# Patient Record
Sex: Male | Born: 1935 | Race: Black or African American | Hispanic: No | Marital: Married | Smoking: Former smoker
Health system: Southern US, Community
[De-identification: ages and names within clinical notes are randomized; demographics above are authoritative.]

## PROBLEM LIST (undated history)

## (undated) DIAGNOSIS — M48061 Spinal stenosis, lumbar region without neurogenic claudication: Secondary | ICD-10-CM

## (undated) DIAGNOSIS — F32A Depression, unspecified: Secondary | ICD-10-CM

## (undated) DIAGNOSIS — M1990 Unspecified osteoarthritis, unspecified site: Secondary | ICD-10-CM

## (undated) DIAGNOSIS — I4891 Unspecified atrial fibrillation: Secondary | ICD-10-CM

## (undated) DIAGNOSIS — N289 Disorder of kidney and ureter, unspecified: Secondary | ICD-10-CM

## (undated) DIAGNOSIS — E785 Hyperlipidemia, unspecified: Secondary | ICD-10-CM

## (undated) DIAGNOSIS — F419 Anxiety disorder, unspecified: Secondary | ICD-10-CM

## (undated) DIAGNOSIS — E269 Hyperaldosteronism, unspecified: Secondary | ICD-10-CM

## (undated) DIAGNOSIS — M4800 Spinal stenosis, site unspecified: Secondary | ICD-10-CM

## (undated) DIAGNOSIS — Z8711 Personal history of peptic ulcer disease: Secondary | ICD-10-CM

## (undated) DIAGNOSIS — C61 Malignant neoplasm of prostate: Secondary | ICD-10-CM

## (undated) DIAGNOSIS — R531 Weakness: Secondary | ICD-10-CM

## (undated) DIAGNOSIS — E538 Deficiency of other specified B group vitamins: Secondary | ICD-10-CM

## (undated) DIAGNOSIS — E876 Hypokalemia: Secondary | ICD-10-CM

## (undated) DIAGNOSIS — I82409 Acute embolism and thrombosis of unspecified deep veins of unspecified lower extremity: Secondary | ICD-10-CM

## (undated) DIAGNOSIS — I10 Essential (primary) hypertension: Secondary | ICD-10-CM

## (undated) DIAGNOSIS — G510 Bell's palsy: Secondary | ICD-10-CM

## (undated) DIAGNOSIS — N390 Urinary tract infection, site not specified: Secondary | ICD-10-CM

## (undated) DIAGNOSIS — I509 Heart failure, unspecified: Secondary | ICD-10-CM

## (undated) DIAGNOSIS — F329 Major depressive disorder, single episode, unspecified: Secondary | ICD-10-CM

## (undated) DIAGNOSIS — I77810 Thoracic aortic ectasia: Secondary | ICD-10-CM

## (undated) DIAGNOSIS — F0390 Unspecified dementia without behavioral disturbance: Secondary | ICD-10-CM

## (undated) DIAGNOSIS — I350 Nonrheumatic aortic (valve) stenosis: Secondary | ICD-10-CM

## (undated) DIAGNOSIS — J45909 Unspecified asthma, uncomplicated: Secondary | ICD-10-CM

## (undated) HISTORY — DX: Spinal stenosis, site unspecified: M48.00

## (undated) HISTORY — DX: Deficiency of other specified B group vitamins: E53.8

## (undated) HISTORY — DX: Thoracic aortic ectasia: I77.810

## (undated) HISTORY — DX: Heart failure, unspecified: I50.9

## (undated) HISTORY — DX: Nonrheumatic aortic (valve) stenosis: I35.0

## (undated) HISTORY — DX: Hyperaldosteronism, unspecified: E26.9

---

## 2000-04-15 ENCOUNTER — Ambulatory Visit (HOSPITAL_COMMUNITY): Admission: RE | Admit: 2000-04-15 | Discharge: 2000-04-15 | Payer: Self-pay | Admitting: *Deleted

## 2000-08-19 ENCOUNTER — Encounter: Admission: RE | Admit: 2000-08-19 | Discharge: 2000-09-11 | Payer: Self-pay | Admitting: Anesthesiology

## 2001-11-22 ENCOUNTER — Emergency Department (HOSPITAL_COMMUNITY): Admission: EM | Admit: 2001-11-22 | Discharge: 2001-11-22 | Payer: Self-pay | Admitting: Emergency Medicine

## 2001-11-26 ENCOUNTER — Emergency Department (HOSPITAL_COMMUNITY): Admission: EM | Admit: 2001-11-26 | Discharge: 2001-11-26 | Payer: Self-pay | Admitting: Emergency Medicine

## 2002-10-10 ENCOUNTER — Encounter: Admission: RE | Admit: 2002-10-10 | Discharge: 2002-10-10 | Payer: Self-pay | Admitting: Nephrology

## 2002-10-10 ENCOUNTER — Encounter: Payer: Self-pay | Admitting: Nephrology

## 2003-04-27 ENCOUNTER — Inpatient Hospital Stay (HOSPITAL_COMMUNITY): Admission: EM | Admit: 2003-04-27 | Discharge: 2003-04-29 | Payer: Self-pay | Admitting: Emergency Medicine

## 2004-04-07 ENCOUNTER — Ambulatory Visit (HOSPITAL_COMMUNITY): Admission: RE | Admit: 2004-04-07 | Discharge: 2004-04-07 | Payer: Self-pay | Admitting: Urology

## 2004-04-16 ENCOUNTER — Ambulatory Visit (HOSPITAL_COMMUNITY): Admission: RE | Admit: 2004-04-16 | Discharge: 2004-04-16 | Payer: Self-pay | Admitting: Urology

## 2004-11-08 ENCOUNTER — Ambulatory Visit (HOSPITAL_COMMUNITY): Admission: RE | Admit: 2004-11-08 | Discharge: 2004-11-08 | Payer: Self-pay | Admitting: Nephrology

## 2004-12-02 ENCOUNTER — Encounter: Admission: RE | Admit: 2004-12-02 | Discharge: 2004-12-02 | Payer: Self-pay | Admitting: Urology

## 2004-12-17 ENCOUNTER — Ambulatory Visit (HOSPITAL_COMMUNITY): Admission: RE | Admit: 2004-12-17 | Discharge: 2004-12-18 | Payer: Self-pay | Admitting: Interventional Radiology

## 2005-01-01 ENCOUNTER — Encounter: Admission: RE | Admit: 2005-01-01 | Discharge: 2005-01-01 | Payer: Self-pay | Admitting: Interventional Radiology

## 2005-01-22 ENCOUNTER — Encounter: Admission: RE | Admit: 2005-01-22 | Discharge: 2005-01-22 | Payer: Self-pay | Admitting: Interventional Radiology

## 2005-05-05 ENCOUNTER — Encounter: Admission: RE | Admit: 2005-05-05 | Discharge: 2005-05-05 | Payer: Self-pay | Admitting: Interventional Radiology

## 2005-07-03 ENCOUNTER — Encounter (INDEPENDENT_AMBULATORY_CARE_PROVIDER_SITE_OTHER): Payer: Self-pay | Admitting: *Deleted

## 2005-07-04 ENCOUNTER — Inpatient Hospital Stay (HOSPITAL_COMMUNITY): Admission: RE | Admit: 2005-07-04 | Discharge: 2005-07-07 | Payer: Self-pay | Admitting: General Surgery

## 2005-07-06 ENCOUNTER — Encounter (INDEPENDENT_AMBULATORY_CARE_PROVIDER_SITE_OTHER): Payer: Self-pay | Admitting: Interventional Cardiology

## 2005-08-25 ENCOUNTER — Encounter: Admission: RE | Admit: 2005-08-25 | Discharge: 2005-08-25 | Payer: Self-pay | Admitting: General Surgery

## 2005-12-16 ENCOUNTER — Encounter: Admission: RE | Admit: 2005-12-16 | Discharge: 2005-12-16 | Payer: Self-pay | Admitting: Interventional Radiology

## 2006-01-12 HISTORY — PX: BACK SURGERY: SHX140

## 2006-11-21 IMAGING — CT CT ABDOMEN WO/W CM
1 of 8 series · 11 of 32 positions shown, 17 images · IV contrast ([ID] OMNI 300)
Comparison: 05/05/05.

CLINICAL DATA: Patient is eight months status post percutaneous radiofrequency ablation to treat an enhancing tumor of the left kidney.  The original procedure was performed on 12/17/04.  This represents an eight month follow up CT scan. 
CT ABDOMEN WITHOUT AND WITH CONTRAST:
TECHNIQUE: Multidetector CT imaging of the abdomen was performed both before and during bolus administration of intravenous contrast.
Contrast:  cc Omnipaque 300.  The patient was fully premedicated prior to the procedure for known IV contrast allergy.  Labs obtained prior to the study showed BUN of 18 and creatinine of 1.2.

[Series 5: recon 2: arterial,venous & (person_name) · axial · arterial · 0.70mm/px · z∈[-174,+43]mm · 11 of 417 slices shown, 17 images]
[im 35/417  soft-tissue]
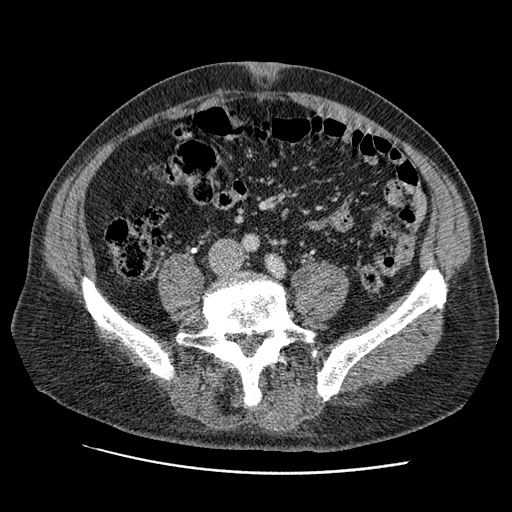
[im 35/417  bone]
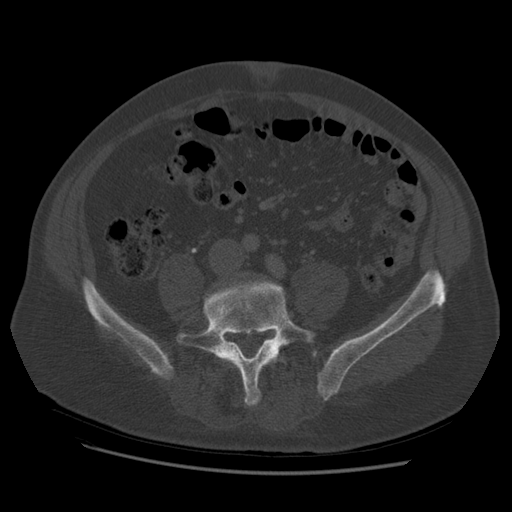
[im 70/417  soft-tissue]
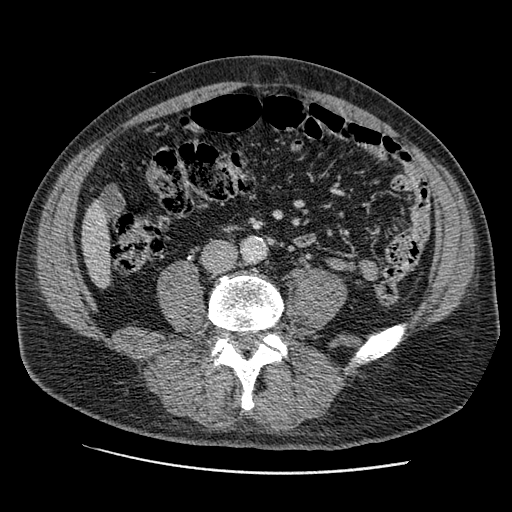
[im 105/417  soft-tissue]
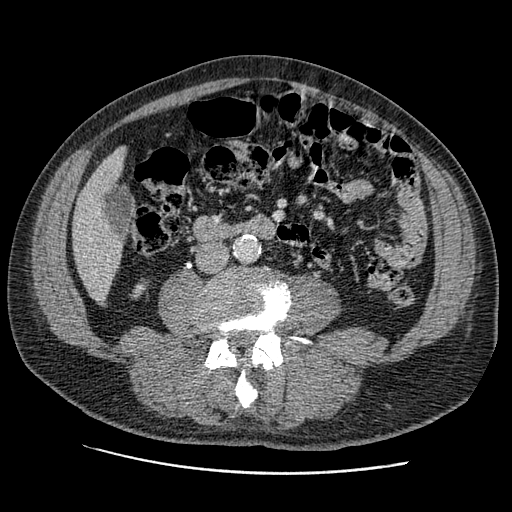
[im 139/417  soft-tissue]
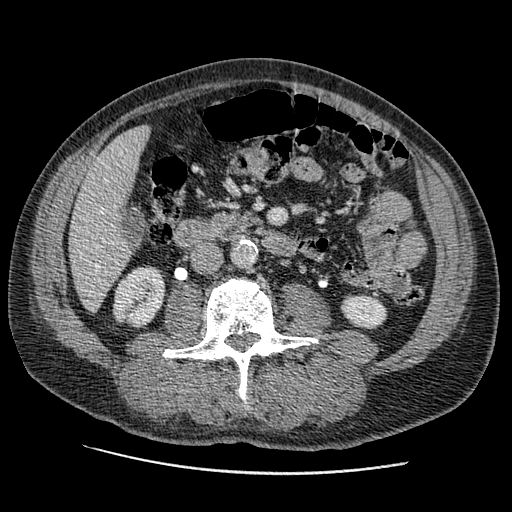
[im 174/417  soft-tissue]
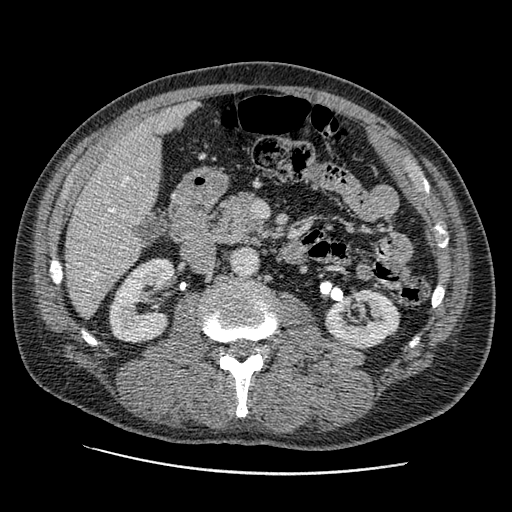
[im 209/417  soft-tissue]
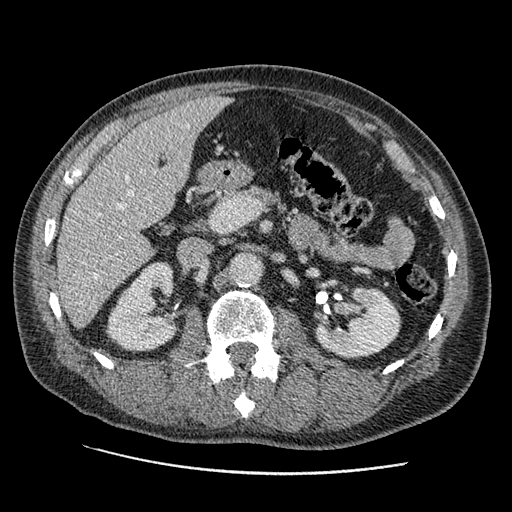
[im 243/417  soft-tissue]
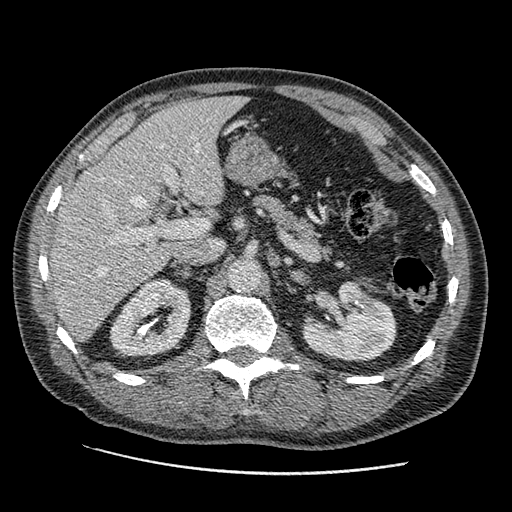
[im 278/417  soft-tissue]
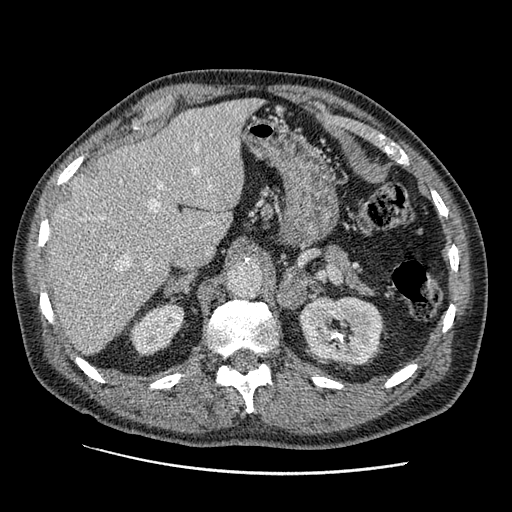
[im 278/417  lung]
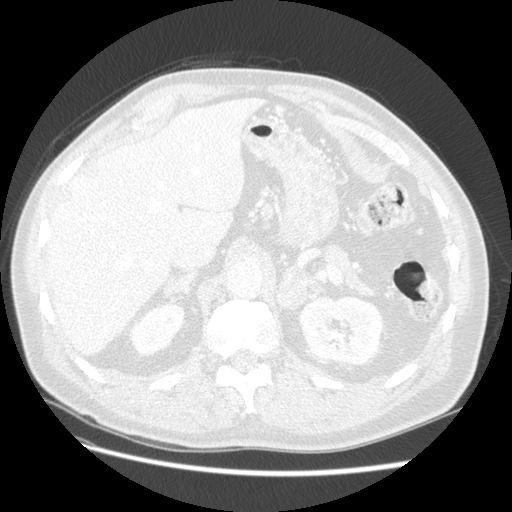
[im 313/417  soft-tissue]
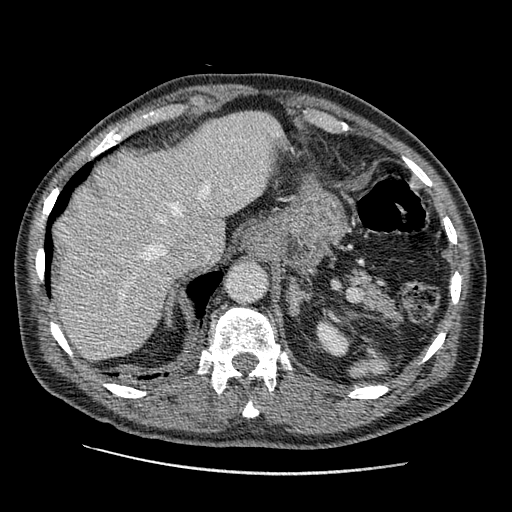
[im 313/417  lung]
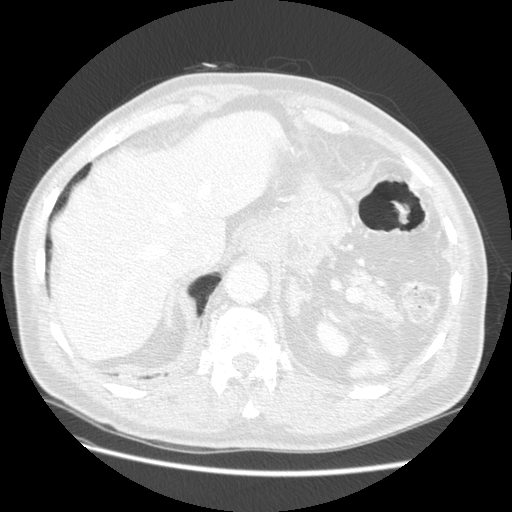
[im 313/417  bone]
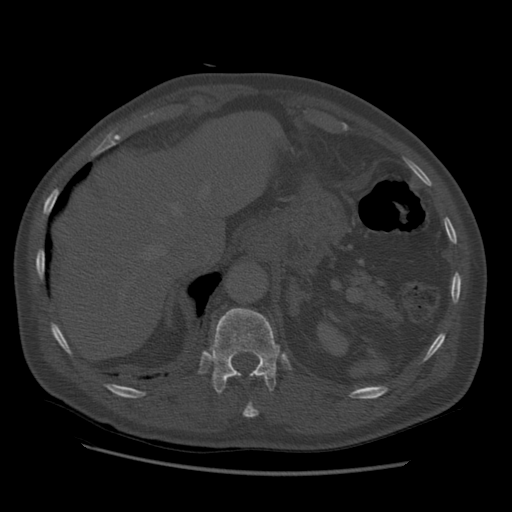
[im 347/417  soft-tissue]
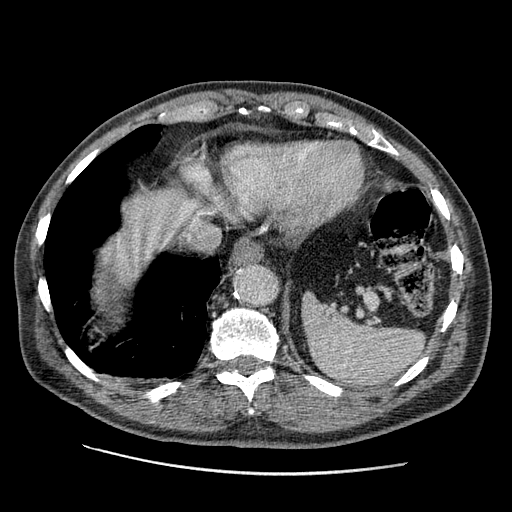
[im 347/417  lung]
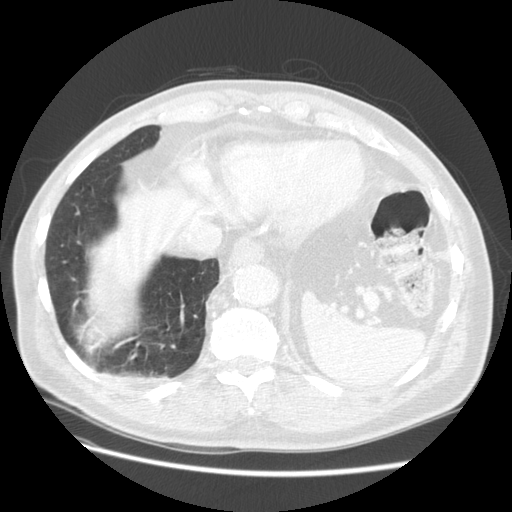
[im 382/417  soft-tissue]
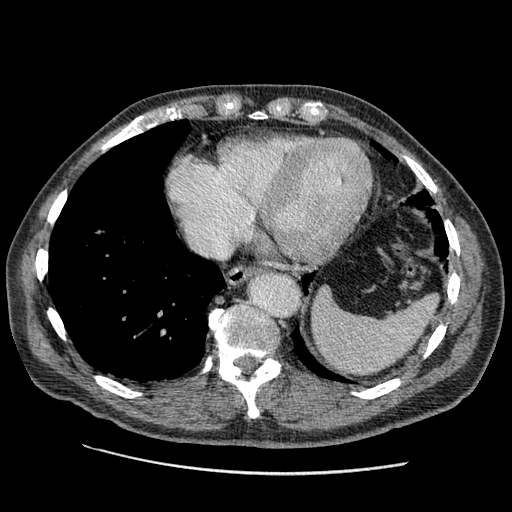
[im 382/417  lung]
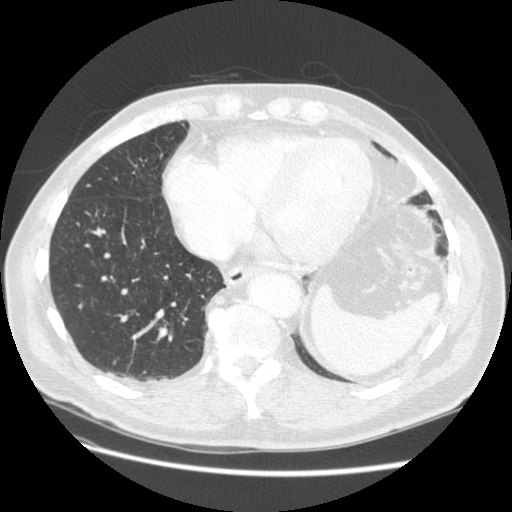

[11 of 32 positions shown; findings below may reference images not displayed]

Prior to administration of contrast material initial unenhanced scans were obtained through the abdomen.  This was followed by a dynamic contrast enhanced study with imaging of the abdomen during arterial, venous, and delayed phases of contrast opacification.  Image reconstruction was performed.  Data was also analyzed with density measurements obtained.
FINDINGS: There is evidence of further retraction of the post ablation scar tissue located at the site of tumor ablation along the anterior cortical margin of the mid left kidney.  The ablation defect now measures approximately 1.2 x 1.1 x 1cm.  Internal density measurements show no appreciable contrast enhancement within the ablation defect with pre and post contrast Hounsfield units demonstrating stable densities.  
The study otherwise demonstrates stable nodular enlargement of the left adrenal gland.  Enhancement of the rest of the left kidney as well as the entire right kidney also shows stable appearance without new or enlarging lesions.  There is a stable small simple cyst in the posterior mid right renal cortex.  No enlarged lymph nodes are identified.  No evidence of abnormal fluid collections or renal obstruction.
IMPRESSION: Continued retraction of post ablation scar tissue at the level of ablated left renal cortical tumor.  Post ablation scar shows slight retraction since the prior study and now demonstrates no evidence of any residual internal enhancement.  There is stable nodular enlargement of the left adrenal gland.  
CC:  Maximillian Leduc, M.D. 
  Harrar Kora, M.D.

## 2007-01-19 ENCOUNTER — Encounter (INDEPENDENT_AMBULATORY_CARE_PROVIDER_SITE_OTHER): Payer: Self-pay | Admitting: Internal Medicine

## 2007-01-19 ENCOUNTER — Inpatient Hospital Stay (HOSPITAL_COMMUNITY): Admission: EM | Admit: 2007-01-19 | Discharge: 2007-01-21 | Payer: Self-pay | Admitting: Emergency Medicine

## 2007-01-19 ENCOUNTER — Ambulatory Visit: Payer: Self-pay | Admitting: Vascular Surgery

## 2007-01-20 ENCOUNTER — Encounter (INDEPENDENT_AMBULATORY_CARE_PROVIDER_SITE_OTHER): Payer: Self-pay | Admitting: Internal Medicine

## 2007-03-15 ENCOUNTER — Encounter: Admission: RE | Admit: 2007-03-15 | Discharge: 2007-03-15 | Payer: Self-pay | Admitting: Interventional Radiology

## 2007-09-28 ENCOUNTER — Ambulatory Visit: Admission: RE | Admit: 2007-09-28 | Discharge: 2007-10-03 | Payer: Self-pay | Admitting: Radiation Oncology

## 2007-10-28 ENCOUNTER — Ambulatory Visit: Admission: RE | Admit: 2007-10-28 | Discharge: 2008-01-12 | Payer: Self-pay | Admitting: Radiation Oncology

## 2008-01-26 ENCOUNTER — Ambulatory Visit: Payer: Self-pay | Admitting: Cardiovascular Disease

## 2008-01-26 ENCOUNTER — Inpatient Hospital Stay (HOSPITAL_COMMUNITY): Admission: EM | Admit: 2008-01-26 | Discharge: 2008-01-29 | Payer: Self-pay | Admitting: Emergency Medicine

## 2008-01-27 ENCOUNTER — Encounter (INDEPENDENT_AMBULATORY_CARE_PROVIDER_SITE_OTHER): Payer: Self-pay | Admitting: Internal Medicine

## 2008-01-27 ENCOUNTER — Ambulatory Visit: Payer: Self-pay | Admitting: Vascular Surgery

## 2008-05-16 ENCOUNTER — Encounter: Admission: RE | Admit: 2008-05-16 | Discharge: 2008-05-16 | Payer: Self-pay | Admitting: Urology

## 2008-09-19 ENCOUNTER — Inpatient Hospital Stay (HOSPITAL_COMMUNITY): Admission: RE | Admit: 2008-09-19 | Discharge: 2008-09-24 | Payer: Self-pay | Admitting: Neurosurgery

## 2008-11-14 ENCOUNTER — Ambulatory Visit (HOSPITAL_COMMUNITY): Admission: RE | Admit: 2008-11-14 | Discharge: 2008-11-14 | Payer: Self-pay | Admitting: Interventional Radiology

## 2008-11-14 ENCOUNTER — Encounter: Admission: RE | Admit: 2008-11-14 | Discharge: 2008-11-14 | Payer: Self-pay | Admitting: Interventional Radiology

## 2009-04-19 ENCOUNTER — Encounter: Admission: RE | Admit: 2009-04-19 | Discharge: 2009-04-19 | Payer: Self-pay | Admitting: Neurosurgery

## 2009-05-21 ENCOUNTER — Encounter: Admission: RE | Admit: 2009-05-21 | Discharge: 2009-06-04 | Payer: Self-pay | Admitting: Neurology

## 2010-02-01 ENCOUNTER — Encounter: Payer: Self-pay | Admitting: Interventional Radiology

## 2010-02-02 ENCOUNTER — Encounter: Payer: Self-pay | Admitting: Interventional Radiology

## 2010-02-12 ENCOUNTER — Ambulatory Visit (HOSPITAL_COMMUNITY): Payer: Medicare Other

## 2010-02-12 ENCOUNTER — Encounter (HOSPITAL_COMMUNITY)
Admission: RE | Admit: 2010-02-12 | Discharge: 2010-02-12 | Disposition: A | Discharge status: Home / Self Care | Payer: Medicare Other | Source: Ambulatory Visit | Attending: Orthopedic Surgery | Admitting: Orthopedic Surgery

## 2010-02-12 ENCOUNTER — Other Ambulatory Visit (HOSPITAL_COMMUNITY): Payer: Self-pay | Admitting: Orthopedic Surgery

## 2010-02-12 DIAGNOSIS — Z01811 Encounter for preprocedural respiratory examination: Secondary | ICD-10-CM

## 2010-02-12 DIAGNOSIS — J45909 Unspecified asthma, uncomplicated: Secondary | ICD-10-CM | POA: Insufficient documentation

## 2010-02-12 DIAGNOSIS — Z01818 Encounter for other preprocedural examination: Secondary | ICD-10-CM | POA: Insufficient documentation

## 2010-02-12 DIAGNOSIS — I10 Essential (primary) hypertension: Secondary | ICD-10-CM | POA: Insufficient documentation

## 2010-02-12 DIAGNOSIS — I517 Cardiomegaly: Secondary | ICD-10-CM | POA: Insufficient documentation

## 2010-02-12 HISTORY — PX: TOTAL KNEE ARTHROPLASTY: SHX125

## 2010-02-12 LAB — DIFFERENTIAL
Basophils Absolute: 0 10*3/uL (ref 0.0–0.1)
Eosinophils Absolute: 0.1 10*3/uL (ref 0.0–0.7)
Eosinophils Relative: 1 % (ref 0–5)
Lymphocytes Relative: 19 % (ref 12–46)
Monocytes Relative: 11 % (ref 3–12)
Neutro Abs: 3.1 10*3/uL (ref 1.7–7.7)

## 2010-02-12 LAB — URINALYSIS, ROUTINE W REFLEX MICROSCOPIC
Bilirubin Urine: NEGATIVE
Ketones, ur: 15 mg/dL — AB
Nitrite: NEGATIVE
Protein, ur: NEGATIVE mg/dL
Specific Gravity, Urine: 1.025 (ref 1.005–1.030)
Urine Glucose, Fasting: NEGATIVE mg/dL
Urobilinogen, UA: 0.2 mg/dL (ref 0.0–1.0)
pH: 5.5 (ref 5.0–8.0)

## 2010-02-12 LAB — TYPE AND SCREEN
ABO/RH(D): B POS
Antibody Screen: NEGATIVE

## 2010-02-12 LAB — SURGICAL PCR SCREEN: MRSA, PCR: NEGATIVE

## 2010-02-12 LAB — ABO/RH: ABO/RH(D): B POS

## 2010-02-12 LAB — COMPREHENSIVE METABOLIC PANEL
Albumin: 4.1 g/dL (ref 3.5–5.2)
BUN: 22 mg/dL (ref 6–23)
Creatinine, Ser: 1.53 mg/dL — ABNORMAL HIGH (ref 0.4–1.5)
Total Protein: 6.1 g/dL (ref 6.0–8.3)

## 2010-02-12 LAB — CBC
MCHC: 32.2 g/dL (ref 30.0–36.0)
MCV: 91.9 fL (ref 78.0–100.0)
RBC: 4.06 MIL/uL — ABNORMAL LOW (ref 4.22–5.81)
RDW: 14.8 % (ref 11.5–15.5)

## 2010-02-12 LAB — APTT: aPTT: 30 seconds (ref 24–37)

## 2010-02-12 LAB — URINE MICROSCOPIC-ADD ON

## 2010-02-14 ENCOUNTER — Inpatient Hospital Stay (HOSPITAL_COMMUNITY)
Admission: RE | Admit: 2010-02-14 | Discharge: 2010-02-17 | DRG: 470 | Disposition: A | Discharge status: Skilled Nursing Facility | Payer: Medicare Other | Attending: Orthopedic Surgery | Admitting: Orthopedic Surgery

## 2010-02-14 DIAGNOSIS — J45909 Unspecified asthma, uncomplicated: Secondary | ICD-10-CM | POA: Diagnosis present

## 2010-02-14 DIAGNOSIS — E876 Hypokalemia: Secondary | ICD-10-CM | POA: Diagnosis not present

## 2010-02-14 DIAGNOSIS — Z79899 Other long term (current) drug therapy: Secondary | ICD-10-CM

## 2010-02-14 DIAGNOSIS — Z8673 Personal history of transient ischemic attack (TIA), and cerebral infarction without residual deficits: Secondary | ICD-10-CM

## 2010-02-14 DIAGNOSIS — I10 Essential (primary) hypertension: Secondary | ICD-10-CM | POA: Diagnosis present

## 2010-02-14 DIAGNOSIS — C61 Malignant neoplasm of prostate: Secondary | ICD-10-CM | POA: Diagnosis present

## 2010-02-14 DIAGNOSIS — I129 Hypertensive chronic kidney disease with stage 1 through stage 4 chronic kidney disease, or unspecified chronic kidney disease: Secondary | ICD-10-CM | POA: Diagnosis present

## 2010-02-14 DIAGNOSIS — N189 Chronic kidney disease, unspecified: Secondary | ICD-10-CM | POA: Diagnosis present

## 2010-02-14 DIAGNOSIS — E538 Deficiency of other specified B group vitamins: Secondary | ICD-10-CM | POA: Diagnosis present

## 2010-02-14 DIAGNOSIS — Z23 Encounter for immunization: Secondary | ICD-10-CM

## 2010-02-14 DIAGNOSIS — F411 Generalized anxiety disorder: Secondary | ICD-10-CM | POA: Diagnosis present

## 2010-02-14 DIAGNOSIS — F0390 Unspecified dementia without behavioral disturbance: Secondary | ICD-10-CM | POA: Diagnosis present

## 2010-02-14 DIAGNOSIS — I5032 Chronic diastolic (congestive) heart failure: Secondary | ICD-10-CM | POA: Diagnosis present

## 2010-02-14 DIAGNOSIS — Z01818 Encounter for other preprocedural examination: Secondary | ICD-10-CM

## 2010-02-14 DIAGNOSIS — M1710 Unilateral primary osteoarthritis, unspecified knee: Principal | ICD-10-CM | POA: Diagnosis present

## 2010-02-14 DIAGNOSIS — E785 Hyperlipidemia, unspecified: Secondary | ICD-10-CM | POA: Diagnosis present

## 2010-02-15 LAB — BASIC METABOLIC PANEL
GFR calc Af Amer: >60 mL/min (ref >60)
GFR calc non Af Amer: >60 mL/min (ref >60)
Glucose, Bld: 123 mg/dL — ABNORMAL HIGH (ref 70–99)
Potassium: 3.1 mEq/L — ABNORMAL LOW (ref 3.5–5.1)
Sodium: 144 mEq/L (ref 135–145)

## 2010-02-15 LAB — PROTIME-INR
INR: 1.16 (ref 0.00–1.49)
Prothrombin Time: 15 seconds (ref 11.6–15.2)

## 2010-02-15 LAB — CBC
Hemoglobin: 10.1 g/dL — ABNORMAL LOW (ref 13.0–17.0)
MCH: 28.5 pg (ref 26.0–34.0)
RBC: 3.55 MIL/uL — ABNORMAL LOW (ref 4.22–5.81)
WBC: 6.9 10*3/uL (ref 4.0–10.5)

## 2010-02-16 LAB — BASIC METABOLIC PANEL
CO2: 35 mEq/L — ABNORMAL HIGH (ref 19–32)
Chloride: 99 mEq/L (ref 96–112)
GFR calc Af Amer: >60 mL/min (ref >60)
Glucose, Bld: 115 mg/dL — ABNORMAL HIGH (ref 70–99)
Potassium: 3 mEq/L — ABNORMAL LOW (ref 3.5–5.1)
Sodium: 145 mEq/L (ref 135–145)

## 2010-02-16 LAB — CBC
HCT: 31.1 % — ABNORMAL LOW (ref 39.0–52.0)
Hemoglobin: 9.8 g/dL — ABNORMAL LOW (ref 13.0–17.0)
RBC: 3.39 MIL/uL — ABNORMAL LOW (ref 4.22–5.81)
WBC: 7.5 10*3/uL (ref 4.0–10.5)

## 2010-02-16 LAB — PROTIME-INR: INR: 1.85 — ABNORMAL HIGH (ref 0.00–1.49)

## 2010-02-17 LAB — BASIC METABOLIC PANEL
CO2: 33 mEq/L — ABNORMAL HIGH (ref 19–32)
Calcium: 9.1 mg/dL (ref 8.4–10.5)
Chloride: 102 mEq/L (ref 96–112)
Creatinine, Ser: 1.19 mg/dL (ref 0.4–1.5)
GFR calc Af Amer: >60 mL/min (ref >60)
Sodium: 146 mEq/L — ABNORMAL HIGH (ref 135–145)

## 2010-02-17 LAB — CBC
HCT: 31.1 % — ABNORMAL LOW (ref 39.0–52.0)
MCV: 91.7 fL (ref 78.0–100.0)
RBC: 3.39 MIL/uL — ABNORMAL LOW (ref 4.22–5.81)
WBC: 7.2 10*3/uL (ref 4.0–10.5)

## 2010-02-17 LAB — PROTIME-INR: INR: 2.46 — ABNORMAL HIGH (ref 0.00–1.49)

## 2010-02-18 NOTE — Op Note (Signed)
NAME:  MAZE, CORNIEL                ACCOUNT NO.:  [id number]  MEDICAL RECORD NO.:  [id number]           PATIENT TYPE:  I  LOCATION:  5014                         FACILITY:  [facility]  PHYSICIAN:  Harvie Junior, M.D.   DATE OF BIRTH:  1923-01-21  DATE OF PROCEDURE:  02/14/2010 DATE OF DISCHARGE:                              OPERATIVE REPORT   This is an 77-year male.  PREOPERATIVE DIAGNOSIS:  End-stage degenerative joint disease, right knee.  POSTOPERATIVE DIAGNOSIS:  End-stage degenerative joint disease, right knee.  OPERATIVE PROCEDURES: 1. Right total knee replacement with a Sigma system, size 6 femur,     size 5 tibia, a 10-mm bridging bearing, and a 41-mm all poly     patella. 2. Computer-assisted right total knee replacement.  SURGEON:  Harvie Junior, MD  ASSISTANT:  None.  ANESTHESIA:  General.  HISTORY:  Mr. Thoma is an 77 year old male with a long history of having had significant complaints of pain in his right knee.  He had been treated conservatively for a long period of time with activity modification, injection therapy.  After failure of all conservative care, the patient was ultimately taken to the operating room for right total knee replacement.  Preoperative x-rays showed bone-on-bone degenerative arthritis.  I talked about the risks and benefits of the surgery, but then given his elder age and overall health history, we felt that he was a reasonable candidate but obviously had some increased risk given his age.  Because of his significant alignment issues, we felt that computer assistance was necessary and this was chosen to be used preoperatively and this was used during the surgery procedure.  The patient was taken to operating room.  After adequate anesthesia was obtained with general anesthetic, the patient was placed supine on the operating table.  The right leg was prepped and draped in usual sterile fashion.  Following this, leg was exsanguinated.   Blood pressure tourniquet was inflated to 350 mmHg.  Following this, midline incision was made, subcutaneous tissues were dissected down to the level of extensor mechanism and a medial parapatellar arthrotomy was undertaken. Anterior and posterior cruciates were excised, as well as medial and lateral meniscus, retropatellar fat pad, and synovial from the anterior aspect of the femur.  Following this, computer modules were placed, two pins in the tibia, two pins in the femur.  The arrays were placed and the registration process was undertaken that was 30 minutes of the surgical procedure.  Once this was completed, the tibia was cut perpendicular to the long axis, the femur was cut perpendicular to the anatomic axis.  The spacer blocks were then put in place.  Following this, the tibia was sized to a 6, anterior and posterior cuts were made, as well as chamfers and box.  Tibia was sized to a 5 and drilled and keeled.  Following this, a 10-mm bridging bearing was put in place and this gave a perfect neutral long alignment according to the computer. At this point, attention was turned to the patella, was cut down to a level of 13 mm and a 41 patella was  chosen and lugs were drilled. Following this, attention was turned towards trial components, all placed and the range of motion was perfect neutral long alignment. Following this, all trial components were removed.  Knee was copiously and thoroughly lavaged, suctioned dry.  Final components were then cemented into place.  Size 5 tibia, size 6 femur, a 41-mm all poly patella, and 10-mm bridging bearing trial were placed.  Cement was allowed to harden.  All excess cement was removed with both cement tools and then the tourniquet was let down.  Once the cement was allowed to harden, all bleeders were controlled with electrocautery.  The trial poly was removed and the final poly placed.  Medium Hemovac drain, medial parapatellar arthrotomy was  closed with one Vicryl running, skin with 0 and 2-0 Vicryl and skin staples.  Sterile compressive dressing was applied as well as a TED stocking.  The patient was taken to recovery room and was noted to be in a satisfactory condition. Estimated blood loss for this procedure was less than 50 mL.     Harvie Junior, M.D.     Ranae Plumber  D:  02/14/2010  T:  02/15/2010  Job:  [id number]  Electronically Signed by Jodi Geralds M.D. on 02/18/2010 01:46:59 PM

## 2010-02-18 NOTE — Discharge Summary (Signed)
NAME:  Joshua Rodgers, Joshua Rodgers                ACCOUNT NO.:  [id number]  MEDICAL RECORD NO.:  [id number]           PATIENT TYPE:  I  LOCATION:  5014                         FACILITY:  [facility]  PHYSICIAN:  Harvie Junior, M.D.   DATE OF BIRTH:  16-Dec-1923  DATE OF ADMISSION:  02/14/2010 DATE OF DISCHARGE:  02/17/2010                              DISCHARGE SUMMARY   CHIEF COMPLAINT:  Right knee pain.  HISTORY OF PRESENT ILLNESS:  This is an 77 year old gentleman with a long history of knee pain who has failed conservative treatment with injections and pain medication.  He now desires a surgical intervention. All risks and benefits of surgery were discussed with the patient.  PAST MEDICAL HISTORY:  Significant for hypertension, anxiety, prostate cancer, asthma, and Bell palsy.  FAMILY HISTORY:  Noncontributory.  SOCIAL HISTORY:  He denies use of alcohol or tobacco.  PHYSICAL EXAMINATION:  Gross examination of the right knee demonstrates a trace effusion and a 5-degree flexion contracture.  He has intact skin and normal neurovascular exam.  X-rays demonstrate bone-on-bone degenerative joint disease of the right knee.  PREOP LABORATORY DATA:  White blood cells 4.4, red blood cells 4.06, hemoglobin 12, hematocrit 37.3, and platelets 186.  PT 13.8, INR 1.04, and PTT 30.  Sodium 148, potassium 5.1, glucose 94, BUN 22, and creatinine 1.53.  Urinalysis was within normal limits.  HOSPITAL COURSE:  Joshua Rodgers was admitted to [facility] on February 14, 2010, when he underwent right total knee arthroplasty.  The procedure was performed by Dr. Jodi Geralds and the patient tolerated it well.  A Hemovac drain was placed into the right knee and he was transferred to the floor on Lovenox and Coumadin for DVT prophylaxis.  A perioperative Foley catheter was also placed.  His home potassium was stopped secondary to his preop potassium being 5.  On the first postoperative day, he was awake and alert and  reporting fairly good pain control.  He denied any nausea or vomiting and was tolerating p.o. intake well. Hemoglobin was 10.1.  Surgical dressing was clean and dry.  His Foley catheter was taken out after physical therapy.  Potassium had decreased to 3.1 and he was restarted on his home potassium.  On the second postoperative day, he continued to report good pain control.  He was making slow progress with physical therapy.  Hemoglobin was 9.8.  His drain was pulled without difficulty and his dressing was changed.  His incision was found to be benign.  Potassium was 3.0, so his potassium chloride was increased to 40 mEq b.i.d. for 2 days.  His PCA was discontinued.  On postoperative day #3, he was eating well and stable from a medical standpoint, so he was discharged to skilled nursing.  DISPOSITION:  The patient was discharged to skilled nursing on February 17, 2010.  He is weightbearing as tolerated.  Skilled nursing will manage his wound, Coumadin, and physical therapy.  DISCHARGE MEDICINES:  As per the HMR with the addition of Percocet forpain control and Coumadin.  He has a target INR of 1.5-2.0 and will remain on the Coumadin for  a total of 4 weeks.  He will follow up with Dr. Luiz Blare in the office in 10-14 days for x-rays and staple removal.  FINAL DIAGNOSIS:  End-stage degenerative joint disease of the right knee.     Shirl Harris, PA   ______________________________ Harvie Junior, M.D.    JW/MEDQ  D:  02/16/2010  T:  02/17/2010  Job:  [id number]  Electronically Signed by Shirl Harris PA on 02/18/2010 08:37:06 AM Electronically Signed by Jodi Geralds M.D. on 02/18/2010 01:47:01 PM

## 2010-02-26 ENCOUNTER — Other Ambulatory Visit: Payer: Self-pay | Admitting: Internal Medicine

## 2010-02-26 DIAGNOSIS — R531 Weakness: Secondary | ICD-10-CM

## 2010-02-26 DIAGNOSIS — G510 Bell's palsy: Secondary | ICD-10-CM

## 2010-02-27 ENCOUNTER — Ambulatory Visit (HOSPITAL_COMMUNITY)
Admission: RE | Admit: 2010-02-27 | Discharge: 2010-02-27 | Disposition: A | Discharge status: Home / Self Care | Payer: Medicare Other | Source: Ambulatory Visit | Attending: Internal Medicine | Admitting: Internal Medicine

## 2010-02-27 DIAGNOSIS — I679 Cerebrovascular disease, unspecified: Secondary | ICD-10-CM | POA: Insufficient documentation

## 2010-02-27 DIAGNOSIS — G319 Degenerative disease of nervous system, unspecified: Secondary | ICD-10-CM | POA: Insufficient documentation

## 2010-02-27 DIAGNOSIS — M6281 Muscle weakness (generalized): Secondary | ICD-10-CM | POA: Insufficient documentation

## 2010-02-27 DIAGNOSIS — R531 Weakness: Secondary | ICD-10-CM

## 2010-02-27 DIAGNOSIS — G510 Bell's palsy: Secondary | ICD-10-CM

## 2010-02-27 DIAGNOSIS — I10 Essential (primary) hypertension: Secondary | ICD-10-CM | POA: Insufficient documentation

## 2010-03-03 ENCOUNTER — Other Ambulatory Visit (HOSPITAL_COMMUNITY): Payer: Medicare Other

## 2010-04-17 ENCOUNTER — Encounter: Payer: Self-pay | Admitting: Orthopedic Surgery

## 2010-04-18 LAB — CBC
HCT: 36.1 % — ABNORMAL LOW (ref 39.0–52.0)
Hemoglobin: 11.6 g/dL — ABNORMAL LOW (ref 13.0–17.0)
RBC: 3.87 MIL/uL — ABNORMAL LOW (ref 4.22–5.81)
RDW: 14.6 % (ref 11.5–15.5)

## 2010-04-18 LAB — BASIC METABOLIC PANEL
CO2: 28 mEq/L (ref 19–32)
Calcium: 9.5 mg/dL (ref 8.4–10.5)
GFR calc Af Amer: >60 mL/min (ref >60)
GFR calc non Af Amer: >60 mL/min (ref >60)
Glucose, Bld: 96 mg/dL (ref 70–99)
Potassium: 4.1 mEq/L (ref 3.5–5.1)
Sodium: 144 mEq/L (ref 135–145)

## 2010-04-28 LAB — POTASSIUM
Potassium: 2.8 mEq/L — ABNORMAL LOW (ref 3.5–5.1)
Potassium: 3.3 mEq/L — ABNORMAL LOW (ref 3.5–5.1)

## 2010-04-28 LAB — URINE MICROSCOPIC-ADD ON

## 2010-04-28 LAB — CBC
HCT: 35.1 % — ABNORMAL LOW (ref 39.0–52.0)
Hemoglobin: 12.4 g/dL — ABNORMAL LOW (ref 13.0–17.0)
Hemoglobin: 11.5 g/dL — ABNORMAL LOW (ref 13.0–17.0)
MCHC: 33.7 g/dL (ref 30.0–36.0)
MCHC: 32.7 g/dL (ref 30.0–36.0)
MCV: 92.8 fL (ref 78.0–100.0)
MCV: 93.9 fL (ref 78.0–100.0)
Platelets: 154 10*3/uL (ref 150–400)
RBC: 3.87 MIL/uL — ABNORMAL LOW (ref 4.22–5.81)
RBC: 3.97 MIL/uL — ABNORMAL LOW (ref 4.22–5.81)
RBC: 3.74 MIL/uL — ABNORMAL LOW (ref 4.22–5.81)
RDW: 15 % (ref 11.5–15.5)
RDW: 15.3 % (ref 11.5–15.5)
WBC: 3.2 10*3/uL — ABNORMAL LOW (ref 4.0–10.5)

## 2010-04-28 LAB — BASIC METABOLIC PANEL
BUN: 15 mg/dL (ref 6–23)
BUN: 11 mg/dL (ref 6–23)
BUN: 11 mg/dL (ref 6–23)
CO2: 36 mEq/L — ABNORMAL HIGH (ref 19–32)
Chloride: 106 mEq/L (ref 96–112)
GFR calc Af Amer: >60 mL/min (ref >60)
GFR calc Af Amer: >60 mL/min (ref >60)
GFR calc non Af Amer: >60 mL/min (ref >60)
GFR calc non Af Amer: >60 mL/min (ref >60)
Glucose, Bld: 138 mg/dL — ABNORMAL HIGH (ref 70–99)
Potassium: 2.4 mEq/L — CL (ref 3.5–5.1)
Potassium: 2.8 mEq/L — ABNORMAL LOW (ref 3.5–5.1)
Potassium: 2.8 mEq/L — ABNORMAL LOW (ref 3.5–5.1)
Sodium: 148 mEq/L — ABNORMAL HIGH (ref 135–145)
Sodium: 147 mEq/L — ABNORMAL HIGH (ref 135–145)

## 2010-04-28 LAB — URINALYSIS, ROUTINE W REFLEX MICROSCOPIC
Bilirubin Urine: NEGATIVE
Glucose, UA: NEGATIVE mg/dL
Hgb urine dipstick: NEGATIVE
Specific Gravity, Urine: 1.023 (ref 1.005–1.030)
Urobilinogen, UA: 0.2 mg/dL (ref 0.0–1.0)
pH: 6 (ref 5.0–8.0)

## 2010-04-28 LAB — LIPID PANEL
Cholesterol: 251 mg/dL — ABNORMAL HIGH (ref 0–200)
HDL: 45 mg/dL (ref >39)
LDL Cholesterol: 170 mg/dL — ABNORMAL HIGH (ref 0–99)
Triglycerides: 181 mg/dL — ABNORMAL HIGH (ref <150)

## 2010-04-28 LAB — DIFFERENTIAL
Eosinophils Absolute: 0 10*3/uL (ref 0.0–0.7)
Lymphocytes Relative: 11 % — ABNORMAL LOW (ref 12–46)
Lymphs Abs: 0.5 10*3/uL — ABNORMAL LOW (ref 0.7–4.0)
Monocytes Relative: 8 % (ref 3–12)
Neutro Abs: 3.4 10*3/uL (ref 1.7–7.7)
Neutrophils Relative %: 79 % — ABNORMAL HIGH (ref 43–77)

## 2010-04-28 LAB — POCT I-STAT, CHEM 8
Glucose, Bld: 104 mg/dL — ABNORMAL HIGH (ref 70–99)
HCT: 38 % — ABNORMAL LOW (ref 39.0–52.0)
Hemoglobin: 12.9 g/dL — ABNORMAL LOW (ref 13.0–17.0)
Potassium: 2.5 mEq/L — CL (ref 3.5–5.1)

## 2010-04-28 LAB — COMPREHENSIVE METABOLIC PANEL
ALT: 20 U/L (ref 0–53)
ALT: 21 U/L (ref 0–53)
AST: 27 U/L (ref 0–37)
Alkaline Phosphatase: 41 U/L (ref 39–117)
BUN: 18 mg/dL (ref 6–23)
CO2: 33 mEq/L — ABNORMAL HIGH (ref 19–32)
CO2: 38 mEq/L — ABNORMAL HIGH (ref 19–32)
Calcium: 7.4 mg/dL — ABNORMAL LOW (ref 8.4–10.5)
Calcium: 7.9 mg/dL — ABNORMAL LOW (ref 8.4–10.5)
Chloride: 102 mEq/L (ref 96–112)
Creatinine, Ser: 1.22 mg/dL (ref 0.4–1.5)
GFR calc Af Amer: >60 mL/min (ref >60)
GFR calc non Af Amer: 57 mL/min — ABNORMAL LOW (ref >60)
GFR calc non Af Amer: >60 mL/min (ref >60)
Glucose, Bld: 111 mg/dL — ABNORMAL HIGH (ref 70–99)
Potassium: 2.5 mEq/L — CL (ref 3.5–5.1)
Sodium: 149 mEq/L — ABNORMAL HIGH (ref 135–145)
Total Bilirubin: 1 mg/dL (ref 0.3–1.2)
Total Protein: 6.2 g/dL (ref 6.0–8.3)

## 2010-04-28 LAB — GLUCOSE, CAPILLARY
Glucose-Capillary: 121 mg/dL — ABNORMAL HIGH (ref 70–99)
Glucose-Capillary: 123 mg/dL — ABNORMAL HIGH (ref 70–99)
Glucose-Capillary: 100 mg/dL — ABNORMAL HIGH (ref 70–99)
Glucose-Capillary: 109 mg/dL — ABNORMAL HIGH (ref 70–99)
Glucose-Capillary: 115 mg/dL — ABNORMAL HIGH (ref 70–99)
Glucose-Capillary: 96 mg/dL (ref 70–99)
Glucose-Capillary: 96 mg/dL (ref 70–99)

## 2010-04-28 LAB — HEMOGLOBIN A1C: Hgb A1c MFr Bld: 5.8 % (ref 4.6–6.1)

## 2010-04-28 LAB — CK TOTAL AND CKMB (NOT AT ARMC)
CK, MB: 3 ng/mL (ref 0.3–4.0)
CK, MB: 3.3 ng/mL (ref 0.3–4.0)
Relative Index: 0.5 (ref 0.0–2.5)
Relative Index: 0.6 (ref 0.0–2.5)

## 2010-04-28 LAB — APTT: aPTT: 29 seconds (ref 24–37)

## 2010-04-28 LAB — PROTIME-INR
INR: 1 (ref 0.00–1.49)
Prothrombin Time: 13.5 seconds (ref 11.6–15.2)

## 2010-04-28 LAB — MAGNESIUM: Magnesium: 2 mg/dL (ref 1.5–2.5)

## 2010-04-28 LAB — TSH: TSH: 1.069 u[IU]/mL (ref 0.350–4.500)

## 2010-04-28 LAB — URINE CULTURE: Culture: NO GROWTH

## 2010-04-28 LAB — TROPONIN I: Troponin I: 0.05 ng/mL (ref 0.00–0.06)

## 2010-04-28 LAB — HOMOCYSTEINE: Homocysteine: 9.6 umol/L (ref 4.0–15.4)

## 2010-05-13 ENCOUNTER — Encounter: Payer: Self-pay | Admitting: Orthopedic Surgery

## 2010-05-27 NOTE — Discharge Summary (Signed)
NAME:  Joshua Rodgers, Joshua Rodgers                ACCOUNT NO.:  [id number]   MEDICAL RECORD NO.:  [id number]          PATIENT TYPE:  INP   LOCATION:  3030                         FACILITY:  [facility]   PHYSICIAN:  Lonia Blood, M.D.DATE OF BIRTH:  Nov 29, 1923   DATE OF ADMISSION:  01/26/2008  DATE OF DISCHARGE:  01/29/2008                               DISCHARGE SUMMARY   PRIMARY CARE PHYSICIAN:  Dr. Marina Gravel with [facility].   DISCHARGE DIAGNOSES:  1. Recurrent left facial weakness.      a.     No evidence of acute cerebrovascular accident and low       clinical suspicion for transient ischemic attack.      b.     Full neurologic evaluation.      c.     Prior history of Bell's palsy.      d.     Symptoms much improved at discharge.  2. Known primary hyperaldosteronism.      a.     Severe refractory hypokalemia during hospital stay.      b.     Concomitant hypomagnesemia.      c.     Status post aggressive replacement with ongoing outpatient       therapy.  3. Stage II chronic kidney disease.  4. Hypertension.  5. Hyperlipidemia.      a.     LDL 170 with HDL of 45.      b.     Counseled on exercise and low cholesterol diet.      c.     Consideration should be given to Lipitor therapy being       initiated outpatient setting.  6. Vitamin B12 deficiency - replacement reinitiated.  7. Degenerative joint disease.  8. Benign prostatic hypertrophy.  9. History of left-sided Bell's palsy as previously noted.   DISCHARGE MEDICATIONS:  1. Amlodipine 10 mg p.o. daily.  2. Flomax 0.4 mg p.o. nightly.  3. Atenolol 50 mg p.o. daily.  4. Iron sulfate 324 mg p.o. b.i.d.  5. Alprazolam 0.5 mg p.o. daily.  6. Doxazosin 2 mg p.o. b.i.d.  7. Tylenol arthritis 650 mg q.6 hours p.r.n.  8. Albuterol metered-dose inhaler p.r.n.  9. Amiloride 10 mg p.o. b.i.d.  10.Aspirin 81 mg p.o. daily.  11.Vitamin B12 1000 mcg p.o. daily.  12.Potassium chloride 40 mEq p.o. t.i.d. on January 18 and  January 19      then 1 tablet p.o. b.i.d. until further advised by Dr. Caryn Section.   FOLLOW UP:  The patient is advised that he should call Dr. Scherrie Gerlach office  to arrange for a recheck of his potassium level on January 19 to January  20.  Followup thereafter will be at the discretion of Dr. Caryn Section.   CONSULTATIONS:  [facility].   PROCEDURES:  1. CT scan of the head January 26, 2008:  Atrophy and mild chronic      microvascular ischemia.  No acute intracranial abnormality.  2. Maxillofacial CT January 26, 2008:  Cervical spondylosis.  No acute      bony abnormality.  3. Cervical spine CT scan - Chronic sinusitis.  No acute abnormality.  4. MRI and MRA of the brain January 27, 2008 - No evidence of acute      ischemia.  Moderate diffuse atrophy and probable small vessel type      disease.  Moderate inflammatory mucosal thickening in the ethmoids.  5. Transthoracic echocardiogram January 27, 2008 - LV systolic      function normal.  Ejection fraction 60%.  No echocardiographic      evidence of cardiac source of embolism.  6. Bilateral carotid Dopplers January 27, 2008 - Right moderate focal      plaque in bifurcation.  No ICA stenosis.  Left, no significant      plaque visualized.  No ICA stenosis.  Vertebral artery flow      antegrade.   HOSPITAL COURSE:  Joshua Rodgers is a very pleasant, highly active 77-  year-old gentleman with a complex medical history as detailed above.  He  presented to the hospital on the date of his admission, January 26, 2008, with complaints of slurred speech and increasing facial weakness  and swelling of the left side of the face.  He was admitted to the acute  units under stroke protocol.  Full evaluation was carried out and  neurology was consulted.  Ultimately, neurology did not feel that the  patient's symptoms were consistent with a CVA or even a TIA.  Full  workup was pursued nonetheless and was unrevealing.  It is noted that  the  patient had Bell's palsy in the past.  No further evaluation was  felt to be necessary from the stroke service standpoint.  The patient's  symptoms did improve significantly over the initial 24 hours of his  hospital stay.  Workup did include evaluation with a fasting lipid  panel.  This revealed a noticeably elevated LDL at 170 total.  The  patient has been counseled on low cholesterol diet.  He has also been  counseled on exercise.  It is recommended that this be rechecked in  approximately 4 to 8 weeks and consideration should be given to  initiating Lipitor therapy if the patient's lipid panel has not  favorably improved.  Furthermore, it was noted that the patient was  diagnosed with B12 deficiency approximately 1 year prior to this  hospitalization.  At that time, B12 level was noted to be significantly  low at 250.  It did not appear the patient was taking B12 at home and  this was empirically resumed.  Recommendation is made that B12 level be  rechecked in 4 to 6 weeks.  It is recognized the oral B12 may be  insufficient, should the patient have a true issue with absorption.   During the patient's hospital stay, significant difficulty was  encountered concerning his known diagnosis of primary  hyperaldosteronism.  A refractory hypokalemia was encountered with  potassium levels consistently less than 3.  Magnesium level was obtained  and was also noted to be significantly depressed.  The patient received  2 doses of IV magnesium with an increase of his magnesium from a nadir  of 1.3 to a peak of 2.0 on the date of his discharge.  Potassium  supplementation was administered in impressive amounts with ultimate  result being potassium of 3.3.  The patient has been counseled  extensively as to the multiple potential clinical manifestations of  refractory hypokalemia.  He has also been advised of the danger  associated with the  possibility of asymptomatic severe hypokalemia.  He  has  been counseled as to the serious nature of this issue and the close  need to follow it up.  He assures the medical staff that he is well-  established with Dr. Marina Gravel and that he has this routinely checked.  It is advised that the patient continue his potassium on a t.i.d. basis  for 48 hours more and that he report to Dr. Scherrie Gerlach office for recheck  potassium within a 48 to 72 hour period.  Patient assures the medical  staff that he will have no difficulty arranging this whatsoever.  His  amiloride is being continued.  It is further recommended that a recheck  of magnesium should be carried out in the outpatient setting as the  patient may need long-term magnesium replacement as well.   On January 29, 2008, the patient was deemed to be clinically stable.  Vital signs were stable.  He was afebrile.  With his agreement, the  patient was therefore cleared for discharge home with followup as  discussed above.      Lonia Blood, M.D.  Electronically Signed     JTM/MEDQ  D:  01/29/2008  T:  01/29/2008  Job:  [id number]   cc:   Wilber Bihari. Caryn Section, M.D.

## 2010-05-27 NOTE — H&P (Signed)
NAME:  Joshua Rodgers, Joshua Rodgers                ACCOUNT NO.:  [id number]   MEDICAL RECORD NO.:  [id number]          PATIENT TYPE:  INP   LOCATION:  1857                         FACILITY:  [facility]   PHYSICIAN:  Eduard Clos, MDDATE OF BIRTH:  [birth year]   DATE OF ADMISSION:  01/26/2008  DATE OF DISCHARGE:                              HISTORY & PHYSICAL   PRIMARY CARE PHYSICIAN:  Dr. Marina Gravel.   CHIEF COMPLAINT:  Slurred speech and increasing facial weakness and  swelling on the left side.   HISTORY OF PRESENTING ILLNESS:  An 77 year old male with known history  of CA prostate who had continued radiation therapy for the last 40 days  which lasted last Thursday a week ago, history of CKD, hypertension,  history of left facial palsy, and previous history of TIA last year  presented to the ER because of increasing weakness of his left side of  the face, particularly the left lower aspect with increasing swelling on  the left side and also had slurred speech over the last 4 or 5 days.  Patient has not had any loss of consciousness or weakness of limbs, had  no difficulty walking.  Patient also has no difficulty swallowing as he  had breakfast in the morning.  Patient in the ER had a CT of the head  which did not show any acute findings and had significant hypokalemia.  Patient has been admitted for further management and evaluation workup  of possible CVA.  Patient denies any chest pain, shortness of breath,  palpitations, weakness of limbs, loss of consciousness, fever or chills,  headache, dysuria, discharge, diarrhea, abdominal pain, nausea, or  vomiting.   PAST MEDICAL HISTORY:  1. CA prostate with radiation therapy now completed last Thursday.  2. CKD.  3. Hypertension.   PAST SURGICAL HISTORY:  Hernia surgery for inguinal hernia.   MEDICATION PRIOR TO ADMISSION:  1. Amlodipine 10 mg p.o. daily.  2. Flomax 0.4 mg p.o. daily.  3. Atenolol 50 mg p.o. daily.  4. Ferrous sulfate 325  mg twice daily.  5. Potassium chloride 20 mEq p.o. b.i.d.  6. Alprazolam 0.5 mg p.o. daily.  7. Doxazosin 1/2 tablet twice daily.  8. Tylenol Arthritis 650 mg p.o. q.6 p.r.n.  9. Albuterol inhalation as needed p.r.n.  10.Amiloride 10 mg p.o. twice daily.  11.Exla.   ALLERGIES:  IV DYE.   FAMILY HISTORY:  Nothing contributory.   SOCIAL HISTORY:  Patient denies smoking cigarettes, drinking alcohol,  using illegal drugs.   REVIEW OF SYSTEMS:  As per in history of presenting illness, nothing of  significance.   PHYSICAL EXAMINATION:  Patient examined at bedside, not in acute  distress.  VITAL SIGNS:  Blood pressure is 155/80.  Pulse 80 per minute.  Temperature 97.5.  Respirations 18 per minute.  O2 saturation 98%.  HEENT:  Anicteric.  No pallor.  There is left upper motor neuron-type  facial palsy on the left aspect with swelling extending from his left  jugulodigastric area to his infraorbital area.  Patient's tongue is not  swollen but it is mildly coated and  there is caries in his tooth but it  is not hurting him.  There is no tongue shift.  PERLA positive.  CHEST:  Bilateral air entry present.  No rhonchi.  No crepitation.  HEART:  S1 and S2 heard.  ABDOMEN:  Soft, nontender.  Bowel sounds heard.  CNS:  Alert, awake, and oriented to time, place, and person.  Facial  features as explained earlier.  Moves upper and lower extremities 5/5.  There is no difficulty in walking.  There is no ataxia or  dysdiadochokinesia.  EXTREMITIES:  Peripheral pulses felt, no edema.   LABS:  CT, C-spine, shows cervical spondylosis.  Chronic sinusitis in  his CT maxillofacial.  CT of the head shows atrophy, mildly chronic  microvascular ischemia, and no acute intracranial abnormality.  EKG,  normal sinus rhythm with PVCs.  CBC, WBC is 4.3, hemoglobin 12.9,  hematocrit 38, platelets 175, neutrophils are 9%.  PT/INR 13.5 and 1.  Complete metabolic panel, sodium 148, potassium 2.5, chloride 99,  carbon  dioxide 38, glucose 104, BUN 21, creatinine 1.5, alk phos 49, AST 23,  ALT 21, total protein 6.2, albumin 3.9, calcium 7.9.  UA is positive for  ketones, negative for glucose, bilirubin, nitrites, leukocytes trace,  WBC 3 to 6, bacteria few.   ASSESSMENT:  1. Slurred speech with increasing weakness of left face and swelling,      possible cerebrovascular accident.  2. Hypernatremia with hypokalemia with a history of primary      hyperaldosteronism.  3. Hypertension.  4. History of previous left facial palsy.  5. Chronic kidney disease.  6. Degenerative joint disease.  7. History of carcinoma prostate status post radiation.   PLAN:  We will admit patient to telemetry to a neuro unit.  We will  place patient on neuro checks.  Get MRI of the brain and MRA of the  brain, 2D echo, carotid Doppler, transcranial Doppler, get a bedside  swallow, and then place patient on aspirin.  We will gently hydrate  patient and we will hold off his amiloride for now and further  recommendations as condition evolves.      Eduard Clos, MD  Electronically Signed     ANK/MEDQ  D:  01/26/2008  T:  01/26/2008  Job:  [id number]

## 2010-05-27 NOTE — H&P (Signed)
NAME:  Joshua Rodgers, Joshua Rodgers                ACCOUNT NO.:  [id number]   MEDICAL RECORD NO.:  [id number]          PATIENT TYPE:  INP   LOCATION:  6707                         FACILITY:  [facility]   PHYSICIAN:  Della Goo, M.D. DATE OF BIRTH:  [birth year]   DATE OF ADMISSION:  01/19/2007  DATE OF DISCHARGE:                              HISTORY & PHYSICAL   PRIMARY CARE PHYSICIAN:  Wilber Bihari. Caryn Section, M.D.   CHIEF COMPLAINTS:  Weakness, left-side of the face.   HISTORY OF PRESENT ILLNESS:  This is an 77 year old male who was brought  to the emergency department by his wife after awakening and complaining  of having weakness in the side of his face.  Both of them report that he  was having left-sided facial drooping.  This occurred at about 12:30  a.m.  He denies having any chest pain, headache, nausea, or vomiting  associated with this; however, he did report beginning to have pain from  the elbow up to the shoulder.  He denies having any slurring of his  speech.  The patient also denies having any fevers or chills.   The patient does report 50 years ago having a similar episode of facial  weakness that lasted for several months.   PAST MEDICAL HISTORY:  1. Hypertension.  2. Anemia.  3. History of renal cell carcinoma in March of 2006.   PAST SURGICAL HISTORY:  1. Right inguinal hernia repair.  2. Small renal cell carcinoma/left renal tumor excision and ablation.   ALLERGIES:  NO KNOWN DRUG ALLERGIES.   MEDICATIONS:  Norvasc, atenolol, doxazosin, ferrous sulfate, potassium  chloride, Xanax and p.r.n. Aleve.   SOCIAL HISTORY:  The patient is married, nonsmoker, nondrinker.   FAMILY HISTORY:  Noncontributory.   REVIEW OF SYSTEMS:  Pertinents are mentioned above.   PHYSICAL EXAMINATION:  GENERAL:  This is a pleasant 77 year old, well-  nourished, well-developed male in no discomfort or acute distress.  VITAL SIGNS:  Temperature 97.9, blood pressure 152/86, heart rate 92,  respirations  20, O2 saturations 97-100% on room air.  HEENT:  Normocephalic.  There is mild left-sided facial drooping  present.  There is no tongue deviation or fasciculation present.  Pupils  are equally reactive to light. Extraocular muscles are intact.  Oropharynx is clear.  NECK:  Supple with full range of motion.  No thyromegaly, adenopathy, or  jugular venous distention.  CARDIOVASCULAR:  Regular rate and rhythm.  No murmurs, gallops, or rubs.  LUNGS:  Clear to auscultation bilaterally.  ABDOMEN:  Positive bowel sounds, soft, nontender, nondistended.  EXTREMITIES:  Without cyanosis, clubbing, or edema.  NEUROLOGIC:  Alert and oriented x3.  The left-sided facial droop is  present.  There is no deficit of sensation of the face.  Otherwise, his  neurologic examination is nonfocal.   LABORATORY STUDIES:  White blood cell count 3.9, hemoglobin 12.7,  hematocrit 37.8, platelets 187, MCV 91.2.  Pro time 13.5, INR 1.0, PTT  29.   A CT scan of the head negative for any acute intracranial hemorrhage.   ASSESSMENT:  This is an 77 year old male being admitted  with:  1. Cerebrovascular accident versus transient ischemic attack.  2. Hypertension.  3. Anemia.  4. Mild leukopenia.   PLAN:  The patient will be admitted to telemetry area, and cardiac  enzymes will be performed.  A CVA versus TIA workup will be initiated.  An MRI/MRA study will be ordered with and without contrast.  A carotid  ultrasound study will also be ordered.  A bedside swallowing evaluation  will also be ordered, and if the patient passes this study, his regular  medications will be continued orally.  DVT and GI prophylaxis have been  ordered as well.      Della Goo, M.D.  Electronically Signed     HJ/MEDQ  D:  01/20/2007  T:  01/20/2007  Job:  [id number]   cc:   Wilber Bihari. Caryn Section, M.D.

## 2010-05-27 NOTE — Discharge Summary (Signed)
Joshua Rodgers, Joshua Rodgers                ACCOUNT NO.:  [id number]   MEDICAL RECORD NO.:  [id number]          PATIENT TYPE:  INP   LOCATION:  6707                         FACILITY:  [facility]   PHYSICIAN:  Isidor Holts, M.D.  DATE OF BIRTH:  [birth year]   DATE OF ADMISSION:  01/19/2007  DATE OF DISCHARGE:  01/21/2007                               DISCHARGE SUMMARY   PRIMARY CARE PHYSICIAN:  Dr. Marina Gravel.   DISCHARGE DIAGNOSES:  1. Transient ischemic attack with transient left facial weakness.  2. Dyslipidemia.  3. Vitamin B12 deficiency.  4. History of primary hyperaldosteronism.  5. Chronic renal insufficiency.  6. History of chronic anemia.  7. Degenerative joint disease.  8. Benign prostatic hypertrophy.  9. History of bronchial asthma.  10.Previous history of left Bells palsy.   DISCHARGE MEDICATIONS:  1. Aspirin 81 mg p.o. daily.  2. Vitamin B12 100 mcg p.o. daily.  3. Zocor 20 mg p.o. q.h.s.  4. Amlodipine 10 mg p.o. daily.  5. Flomax 0.4 mg p.o. daily.  6. Ferrous sulfate 325 mg p.o. daily.  7. Atenolol 50 mg p.o. daily.  8. Amiloride 10 mg p.o. b.i.d.  9. Klor-Con 40 mEq p.o. b.i.d.  10.Alprazolam 0.5 mg p.o. p.r.n. daily.   Note:  Doxazosin has been discontinued, until reevaluated by primary MD  in view of well controlled blood pressure.   PROCEDURE:  1. Had CT scan dated January 19, 2007. This showed atrophy, evidence of      chronic small vessel ischemic changes, otherwise stable head CT.  2. Brain MRI/MRA dated January 19, 2007. This showed diffuse cerebral      atrophy, stable compared to previous examination with prominence of      the ventricles probably related to Ex Vacuo phenomenon. Nonspecific      subcortical white matter changes supratentorially most likely      related to small vessel type diffuse changes due to hypertension      and/or diabetes. Moderate confirmatory thickening of the mucosa of      the maxillary sinuses and to a lesser degree the  ethmoid air cells      on frontal sinuses. MRA showed hypoplastic right A1segment on a      developmental basis, mildly deformed prominence of the left      vertebral basilar junction proximal to left PICA.  3. 2-D echocardiogram dated January 20, 2007. This showed overall      normal left ventricular systolic function, EF 65%, no diagnostic      evidence of left ventricular regional wall motion abnormalities.      There was increased relative contribution of atrial contractions,      left ventricular filling. Doppler parameters were consistent with      abnormal left ventricular relaxation. Also findings of very mild      aortic valve stenosis are noted. The left atrium was mildly      dilated. There was no echocardiographic evidence for cardiac source      of embolism.  4. Carotid/vertebral artery duplex scan dated January 19, 2007. This  showed no significant right or left ICA stenosis, left vertebral      artery flow is adequate. There was mild focal plaque noted on the      far wall of the ICA bifurcation with acoustic shadowing. The right      vertebral artery was not isonated.   CONSULTATIONS:  None.   HISTORY OF PRESENT ILLNESS:  As in H&P note of January 19, 2007 dictated  by Dr. Della Goo. However, in brief, this is an 77 year old male,  with a known history of hypertension, chronic anemia, history of renal  cell carcinoma diagnosed 2006 status post excision and ablation, primary  hyperaldosteronism, BPH, DJD, bronchial asthma, hypertension, diastolic  dysfunction, cerebrovascular disease as well as remote history of left  Bells palsy, who presents with transient left facial weakness. He was  admitted for further evaluation, investigation and management.   CLINICAL COURSE:  1. TIA.  For history of presentation, refer to admission history      above. As mentioned above, the patient presents with transient left-      sided facial weakness. However, there was no  associated dysarthria      or upper or lower extremity weakness or ataxia. The patient was      admitted for CVA workup as well as risk factor modification. He      remained normotensive throughout the course of his hospitalization.      As a matter of fact, we have held his Doxazosin, secondary to well      controlled blood pressure. Imaging studies including a head CT      scan, brain MRI/MRA as well as carotid duplex scans and 2-D      echocardiogram were all unremarkable for concerning pathology. He      has been commenced on low dose Aspirin. Focal neurology did not      recur. Clearly the patient has had a transient ischemic attack.   1. Hypertension.  As mentioned above, this was adequately controlled      during the course of the patient's hospitalization with pre-      admission antihypertensive medication. As of January 21, 2007, blood      pressure was normal at 126/79 mmHg.   1. Dyslipidemia.  As part of CVA workup, the patient underwent a      fasting lipid profile which showed the following findings. Total      cholesterol 247, triglycerides 143, HDL 57, LDL 161. In view of      TIA, the patient's target LDL should be less than 70. He has been      commenced on statin accordingly.   1. Anemia.  The patient has a history of chronic anemia and is on iron      supplements. His iron studies and hematinics during this      hospitalization showed the following findings:  Iron 66, percent      saturation 23, TIBC 286, folate 2/20, vitamin B12 250, ferritin      400. As vitamin B12 is borderline low, he has been commenced on      vitamin B12 supplements.   1. History of chronic renal insufficiency.  The patient's renal      function remained reasonable/stable during this hospitalization. On      January 21, 2007, his BUN was 14 with a creatinine of 1.02.   1. History of bronchial asthma.  The patient was completely      asymptomatic from this viewpoint,  during this course of  his      hospitalization.   1. History of primary hyperaldosteronism.  The patient continues on      Amiloride and potassium supplements.   1. Benign prostatic hypertrophy. There were no problems referable to      this, during the course of the patient's hospitalization. He      continues on Flomax.   DISPOSITION:  The patient was on January 21, 2007 considered sufficiently  recovered and clinically stable to be discharged. He was therefore  discharged accordingly.   DIET:  Heart healthy.   ACTIVITY:  No restrictions.   FOLLOWUP:  The patient is recommended to followup routinely with his  primary MD, per prior scheduled appointment.      Isidor Holts, M.D.  Electronically Signed     CO/MEDQ  D:  01/21/2007  T:  01/21/2007  Job:  [id number]

## 2010-05-27 NOTE — Consult Note (Signed)
NAME:  Joshua Rodgers, Joshua Rodgers                ACCOUNT NO.:  [id number]   MEDICAL RECORD NO.:  [id number]          PATIENT TYPE:  INP   LOCATION:  3030                         FACILITY:  [facility]   PHYSICIAN:  Levert Feinstein, MD          DATE OF BIRTH:  04-16-1923   DATE OF CONSULTATION:  DATE OF DISCHARGE:                                 CONSULTATION   CHIEF COMPLAINT:  Stroke.   HISTORY OF PRESENT ILLNESS:  This is a consult from [employer],  [place], for possible stroke.   The patient is an 77 year old right-handed African American male, with  past medical history of prostate cancer, has finished radiation therapy  a week ago, also had a history of hypertension, left Bell palsy 40 years  ago with some residual left upper and lower face weakness, previous  history of TIA.   He presenting with couple of days history of increased left facial  sweating and weakness, more noticeable this morning,  when he brush his  teeth, water dripping out left mouth corner and he also noticed mild  increased of slurred speech, but there was no limb muscle weakness, no  aphasia.   CT of the brain has demonstrated diffuse atrophy.  There was old left  basal ganglion lacuna.  He was also found to have significant  hypokalemia and chronic renal disease.   REVIEW OF SYSTEMS:  He denies headache, visual change, lateralized to  motor deficit in the link.  No chest pain.   PAST MEDICAL HISTORY:  1. Prostate cancer, status post radiation therapy.  2. Chronic renal disease.  3. Hypertension.   SURGICAL HISTORY:  Hernia.   HOME MEDICATIONS:  Amlodipine, Flomax, atenolol, Ferrex, potassium,  Xanax, doxazosin, Tylenol, albuterol, and amiloride.   ALLERGIES:  IV DYE.   FAMILY HISTORY:  Noncontributory.   SOCIAL HISTORY:  Denies smoking, drinking.   PHYSICAL EXAMINATION:  VITAL SIGNS:  Temperature 97.5, blood pressure  123/71, heart rate of 88, respirations of 20.  CARDIAC:  Regular rate and rhythm.  PULMONARY:  Clear to auscultation bilaterally.  NECK:  Supple.  No carotid bruits.  NEUROLOGIC:  He is awake, alert, and oriented to [facility] on January  2010 and there is some mild thick speech, but no dysarthria.  No  aphasia.  Cranial nerves II-XII.  He has static left ptosis.  Pupil are  equal, round, and reactive to light.  Extraocular movements were full.  Visual fields were full on confrontational test.  Facial sensation was  normal.  He has mild left eye closure, cheek puff, and frontalis  weakness.  Head turning and shoulder shrugging were normal and  symmetric.  Uvula and tongue midline.  MOTOR:  Normal tone, bulk, and strength.  Sensory, normal to light touch  and pinprick.  Deep tendon reflex is brisk in the upper extremities,  hypoactive patellar and absent bilateral Achilles.  Plantar responses  are flexor. Coordination:  Normal finger-to-nose and heel-to-shin.  Gait; he walk with a wide base cautious gait.   CT of the brain without contrast revealed there was  diffuse atrophy and  left basal ganglion and lacuna.   LAB EVALUATION:  Troponin was negative.  CK was mild elevated 554.  UA  had few bacteria, positive to leukocyte.  CMP has demonstrate potassium  of 2.6, glucose of 111 and creatinine of 1.2, calcium of 7.9.   ASSESSMENT AND PLAN:  An 77 year old gentleman, presenting with  worsening left facial weakness with previous history of left Bell palsy.  I doubt it is represent a stroke, but I agree primary team plan an MRI  of the brain with and without contrast of complete workup.      Levert Feinstein, MD  Electronically Signed     YY/MEDQ  D:  01/26/2008  T:  01/27/2008  Job:  [id number]

## 2010-05-30 NOTE — Op Note (Signed)
[facility]  Patient:    Joshua Rodgers, Joshua Rodgers                         MRN: [id number] Proc. Date: 08/26/00 Adm. Date:  [id number] Attending:  Thyra Breed CC:         Harvie Junior, M.D.   Operative Report  PROCEDURE:  Lumbar epidural steroid injection.  DIAGNOSIS:  Lumbar spinal stenosis, with underlying lumbar spondylosis.  INTERVAL HISTORY:  The patient has noted marked improvement in his pain after his first injection.  He is here for a repeat injection today.  He has had minimal side effects to the first injection.  EXAMINATION:  VITAL SIGNS:  Blood pressure 168/85, heart rate 72, respiratory rate 16, O2 saturations 97%, pain level 5 out of 10.  BACK:  Shows good healing from his previous injection site.  NEUROLOGIC:  Grossly unchanged.  DESCRIPTION OF PROCEDURE:  After an informed consent was obtained, the patient was placed in the sitting position and monitored.  His back was prepped with Betadine x 3.  A skin wheal was raised at the L4-5 interspace with 1% lidocaine.  A 20-gauge Tuohy needle was introduced into the lumbar epidural space to loss of resistance to preservative-free normal saline.  There was no cerebral spinal fluid nor blood.  Then 80 mg of Medrol mixed with 8 mL of preservative-free normal saline was gently injected.  The needle was flushed and removed intact.  POSTPROCEDURE CONDITION:  Stable.  DISCHARGE INSTRUCTIONS: 1. Resume previous diet. 2. Limitation in activities per instruction sheet, as outlined by my    assistant today. 3. Continue on current medications. 4. Follow up with me in one week for repeat injection. DD:  08/26/00 TD:  08/26/00 Job: [id number]

## 2010-05-30 NOTE — H&P (Signed)
NAME:  Joshua Rodgers, Joshua Rodgers NO.:  [id number]   MEDICAL RECORD NO.:  [id number]                   PATIENT TYPE:  EMS   LOCATION:  MINO                                 FACILITY:  [facility]   PHYSICIAN:  Marlan Palau, M.D.               DATE OF BIRTH:  05-31-1923   DATE OF ADMISSION:  04/27/2003  DATE OF DISCHARGE:                                HISTORY & PHYSICAL   HISTORY OF PRESENT ILLNESS:  Joshua Rodgers is a 77 year old right handed  black male, born 1923-02-25, with a history of hypertension, mild  chronic renal insufficiency, followed by Dr. Zada Girt.  This patient  has come to [facility] Emergency Room with a five day history of  gait disturbance. This patient has had sudden onset of dizziness, problems  with balance that began while at church five days ago.  The patient denied  headache, visual field disturbance, speech disturbance, swallowing problems,  and numbness.  The patient feels a bit weak on the right side of the body.  The patient has noted this problem has persisted since that time without  much improvement.  The patient was told to go to the emergency room today  for further evaluation.   PAST MEDICAL HISTORY:  1. History of gait disorder.  CT of the head shows ill-defined left centrum     semiovale infarct.  2. Hypertension.  3. Benign prostatic hypertrophy.  4. Asthma.  5. Primary hyperaldosteronemia.  6. Mild chronic renal insufficiency.  7. Iron deficiency in the past.  8. Degenerative arthritis.   MEDICATIONS:  1. Amiloride 5 mg two twice a day.  2. Ferrous _________ 90 mg daily.  3. Norvasc 10 mg a day.  4. Flomax 0.4 mg daily.  5. Doxazosin 4 mg one half tablet b.i.d.  6. Potassium 20 mEq two twice a day.  7. Librium 10 mg b.i.d.  8. Atenolol 50 mg a day.  9. Albuterol inhaler if needed.   ALLERGIES:  IVP dye.  No allergies to medications.   SOCIAL HISTORY:  Does not smoke or drink.  The patient is  married, lives in  the [place] area.  He has two children who are alive and  well.  The patient is retired.   FAMILY MEDICAL HISTORY:  Mother died with heart disease.  Father died with  heart disease.  Patient's brother died of a stroke.  Two sisters, one has a  history of senile dementia of Alzheimer's type.  One is alive and well.   REVIEW OF SYSTEMS:  Notable for no recent fevers, chills.  The patient  denies headache, neck pain, denies shortness of breath, chest pain, nausea,  vomiting, denies any problems controlling bowels or bladder.  Denies black  out episodes or seizures.  The patient does have some history of low back  pain and has been evaluated for  this in the past.   PHYSICAL EXAMINATION:  VITAL SIGNS:  Blood pressure is 144/79, heart rate  62, respiratory rate 18, temperature afebrile.  GENERAL:  This patient is a fairly well developed black male who is alert  and cooperative at the time of examination.  HEENT:  Head is atraumatic.  Eyes:  Pupils round, reactive to light.  Disks  soft, flat bilaterally.  NECK:  Supple.  No carotid bruits noted.  RESPIRATORY:  Clear.  CARDIOVASCULAR:  Reveals a regular, rate and rhythm.  No obvious murmurs,  rubs noted.  ABDOMEN:  Soft.  Positive bowel sounds.  No organomegaly or tenderness  noted.  EXTREMITIES:  Without significant edema.  NEUROLOGIC:  Cranial nerves as above.  Facial symmetry is present.  The  patient has good sensation face to pin prick, soft touch bilaterally.  He  has good strength of the facial muscles, ____________ shoulder shrug  bilaterally.  Speech is well enunciated, not aphasic.  Again, extraocular  movements are full.  Visual fields are full.  Motor test reveals 5/5  strength in all four extremities to direct testing.  No motor tone  abnormalities noted.  The patient has good pin prick, soft touch, vibratory  sensation throughout.  The patient has fair finger-to-nose-finger, heel-to-  shin  bilaterally.  Deep tendon reflexes suppressed but symmetric throughout.  Again, toes neutral bilaterally.  With ambulation __________ patient leans  to the right, is not able to form a tandem gait without assistance.  Romberg  negative.  No pronator drift is seen.   LABORATORY VALUES:  Notable for a white count of 3.5, hemoglobin of 13.5,  hematocrit of 40.4, MCV of 89.1, platelets of 224.  Sodium 143, potassium  4.0, chloride of 108, CO2 of 30, glucose of 110, BUN of 14, creatinine 1.3,  calcium 8.9.  Total protein 5.7, albumin 3.6, AST 22, ALT 20, alkaline  phosphatase 66, total bili 0.7.  CK level 364.   CT of the head is as above.   Chest x-ray and EKG are pending.   IMPRESSION:  1. Onset of gait disturbance possible left centrum semiovale infarct by     computed tomography.  2. Hypertension.   The patient has had a sudden onset of a gait disturbance that has persisted  over the last five days.  The patient has not had any falls at home.  He  comes in for evaluation.  We will perform a brief workup for a stroke. The  patient was not on any antiplatelet agents on a regular basis.  The patient  claims he took aspirin 2-3 times a week but not daily.   PLAN:  1. Admission to [facility]  2. MRI of the brain.  3. MRI angiogram of the intracranial, extra-cranial vessels.  4. A 2D echocardiogram.  This may need to be performed as an outpatient.  5. Physical therapy evaluation.  6. Aspirin therapy.   We will follow the patient's clinical course while in house.  Thank you very  much.                                                Marlan Palau, M.D.    CKW/MEDQ  D:  04/27/2003  T:  04/28/2003  Job:  [id number]   cc:   Wilber Bihari. Caryn Section, M.D.  [address]  [place]  [place] [address]  Fax: [phone]   [facility]  [address]  [address]

## 2010-05-30 NOTE — Discharge Summary (Signed)
NAME:  Joshua Rodgers, Joshua Rodgers NO.:  [id number]   MEDICAL RECORD NO.:  [id number]          PATIENT TYPE:  INP   LOCATION:  5004                         FACILITY:  [facility]   PHYSICIAN:  Lyn Records, M.D.   DATE OF BIRTH:  October 04, 1923   DATE OF ADMISSION:  07/03/2005  DATE OF DISCHARGE:                                 DISCHARGE SUMMARY   CONCLUSIONS:  1.  Congestive heart failure, probably secondary to diastolic heart      failure/dysfunction.      1.  Resolving with diuresis.  2.  Longstanding history, greater than 50 years.      1.  LVH.      2.  Mild renal insufficiency.  3.  Status post right inguinal herniorrhaphy.   RECOMMENDATIONS:  1.  Agree with management, including loop diuretic.  2.  Check echocardiogram.  3.  Consider ischemic evaluation if regional wall motion abnormality noted      on echo.  I doubt that heart failure is related to coronary artery      disease, and myocardial infarction has been ruled out.  4.  Check baseline BNP level.   COMMENTS:  The patient is 13 and came in for an inguinal herniorrhaphy.  In  the postanesthesia care area he developed arterial desaturation and chest x-  ray demonstrated congestive heart failure, asymmetrical.  He subsequently  received IV Lasix and has improved.  He received a total of approximately  1400 mL of IV fluid prior to identifying the presence of congestive heart  failure.  He denies chest pain, orthopnea or PND at home.   USUAL MEDICATIONS:  1.  __________ 5 mg b.i.d.  2.  Flomax 0.4 mg per day.  3.  Atenolol 50 mg per day.  4.  Iron sulfate one per day.  5.  Potassium chloride two tablets b.i.d.  6.  Alprazolam 0.5 mg q.a.m.  7.  Doxazosin one-half tablet p.o. b.i.d.  8.  Amlodipine 10 mg each morning.  9.  Albuterol inhaler p.r.n.  10. Ex-Lax p.r.n.   ALLERGIES:  None known.   SIGNIFICANT PAST MEDICAL PROBLEMS:  1.  Primary hyperaldosteronism.  2.  Longstanding history of hypertension.  3.   Mild renal insufficiency.  4.  Back degeneration.  5.  Reactive lung disease.  6.  Benign prostatic hypertrophy.   SOCIAL HISTORY:  Does not smoke or drink.   FAMILY HISTORY:  Noncontributory.   OBJECTIVE:  GENERAL:  On exam, the patient is in no acute distress.  He is  lying flat in bed.  He states his breathing is okay.  VITAL SIGNS:  Blood pressure is 121/78, heart rate is 87.  He is negative  approximately 800 mL over the past 24 hours.  SKIN:  Clear.  HEENT:  Pupils equal and reactive to light.  Extraocular movements are full.  NECK:  No JVD or carotid bruits.  LUNGS:  Clear to auscultation and percussion.  CARDIAC:  S4 gallop, otherwise unremarkable, no murmur, no rub.  ABDOMEN:  Soft, no ascites.  EXTREMITIES:  No edema.   EKG  done yesterday reveals prominent voltage, occasional PVC, incomplete  right bundle branch block.   LABORATORY DATA:  BUN and creatinine of 21 and 1.4, potassium 3.9,  hemoglobin 11.9.  Chest x-ray on June 24 revealed improved right-sided air  space disease/edema.      Lyn Records, M.D.  Electronically Signed     HWS/MEDQ  D:  07/06/2005  T:  07/06/2005  Job:  [id number]   cc:   Leonie Man, M.D.  [address]  [address]  [place]  [place] [address]   Rodman Pickle  Fax: [phone]

## 2010-05-30 NOTE — Procedures (Signed)
[facility]  Patient:    Joshua Rodgers, Joshua Rodgers                         MRN: [id number] Proc. Date: 08/19/00 Adm. Date:  [id number] Attending:  Thyra Breed CC:         Harvie Junior, M.D.  Richard F. Caryn Section, M.D.   Procedure Report  ATTENDING:  Thyra Breed, M.D.  PROCEDURE:  Lumbar epidural steroid injection.  DIAGNOSIS:  Lumbar spinal stenosis with underlying lumbar spondylosis.  HISTORY:  Mr. Cafiero is a very pleasant 77 year old who is sent to [place] by Harvie Junior, M.D., for a series of lumbar epidural steroid injections for underlying spinal stenosis diagnosed by MRI on 08/10/00.  The patient noted the onset of pain about six weeks ago of sudden onset when he awoke one morning. He assumed it was arthritis, but it did not improve.  He saw his primary care physician, Wilber Bihari. Caryn Section, M.D., who sent him to Dr. Luiz Blare, who initially treated him with a steroid Dosepak, followed by ______ with no overall improvement.  An MRI was obtained, which showed multilevel degenerative disk disease and spondylosis with posterior annular bulging and moderate spinal canal stenosis and bilateral neural foraminal narrowing at L4-5.  The patient describes a deep, achy discomfort, predominantly to the left lower extremity laterally over the hip and thigh, to the lateral calf, and to the foot, which is made worse by walking greater than 25 feet and improved by sitting down.  He has to lean over a grocery cart when he goes to the grocer. This pain is made worse by going to the bathroom or shaving.  He denied any bowel or bladder incontinence but does have some intermittent weakness.  CURRENT MEDICATIONS: Cardura, Norvasc, ______, hydrochlorothiazide, atenolol, ______, K-Dur, and Theo-Dur.  ALLERGIES:  NO KNOWN DRUG ALLERGIES.  HE DOES HAVE AN ALLERGY TO IV DYE.  FAMILY HISTORY:  Positive for osteoarthritis, coronary artery disease, hypertension, and strokes.  PAST SURGICAL  HISTORY:  Negative.  ACTIVE MEDICAL PROBLEMS:  Include a remote history of asthma, hypertension, osteoarthritis, and remote history of peptic ulcer disease.  SOCIAL HISTORY:  The patient is a nonsmoker, stopping in the 42s and does not drink alcohol.  He is a retired Copywriter, advertising from the school system.  REVIEW OF SYSTEMS:  General: negative.  Head: negative.  Eyes:  significant for glasses.  Nose, mouth, and throat: negative.  Ears: negative.  Pulmonary: history of asthma.  Cardiovascular: history of hypertension x40 years.  Denies any ischemia of his heart or infarcts.  GI: remote history of peptic ulcer disease.  No recent GI symptoms.  GU: negative.  Musculoskeletal: history of shoulder, neck, and back pains.  See HPI for details.  Neurologic:  no history of seizure or strokes.  See HPI for pertinent positives.  Cutaneous: negative. Hematologic:  negative.  Endocrine: negative.  Psychiatric: negative. Allergy/immunologic:  negative.  PHYSICAL EXAMINATION:  VITAL SIGNS:  Blood pressure 165/95, heart rate 86, respirations 22, and O2 sat 98%.  Pain level 7 out of 10.  GENERAL:  This is a pleasant male in no acute distress.  HEENT: Head: normocephalic and atraumatic with male pattern balding.  Eyes: extraocular movements intact with conjunctivae and sclerae clear.  He does have some mild exophthalmos of his left eye, relative to his right.  His face demonstrates some mild weakness on the left side of his face.  He did state that he  had a history of Bells palsy many years ago.  Nose: patent nares. Oropharynx is free of lesions.  NECK:  Demonstrated mild restriction of range of motion with carotids 2+ and symmetric without appreciable bruits.  LUNGS:  Clear.  HEART:  Regular rate and rhythm.  ABDOMINAL/GENITALIA/RECTAL:  Not performed.  BACK:  Revealed mild scoliosis of the thoracolumbar spine with straight leg raise sign positive on the left side at 70  degrees.  EXTREMITIES:  No cyanosis, clubbing, or edema.  Radial pulses and dorsalis pedis pulses were 1-2+ and symmetric.  NEUROLOGIC:  The patient was oriented to person, place, time, and reason for visit.  Deep tendon reflexes were symmetric in the upper and lower extremities.  Cranial nerves II-XII are significant for some mild left facial weakness.  Coordination was grossly intact.  Motor was 5/5.  Sensory was intact to vibratory sense, pinprick, and light touch.  IMPRESSION: 1. Low back pain with pseudoclaudication, predominantly into the left lower extremity with evidence of neural foraminal stenosis at L4-5 and underlying central canal stenosis with underlying spondylosis. 2. Hypertension per Wilber Bihari. Caryn Section, M.D. 3. Osteoarthritis. 4. Remote history of peptic ulcer disease. 5. Remote history of Bells palsy of the left side of his face. 6. History of childhood asthma.  DISPOSITION:  I reviewed the potential risks, benefits, and limitations of her lumbar epidural steroid injection in detail with the patient and his wife in general. I reviewed their questions.  He is interested in pursing this.  PROCEDURE:  After informed consent was obtained, the patient was placed in the sitting position and monitored.  His back was prepped with Betadine x3.  A skin wheal was raised at the L5-S1 interspace with 1% lidocaine.  A 20-gauge Touhy needle was introduced in the lumbar epidural space with loss of resistance to preservative-free normal saline.  The depth was 5.5 cm.  There were was no CSF or blood.  A mixture of 80 mg of Medrol with 8 ml of preservative-free normal saline was gently injected.  The needle was flushed with preservative-free normal saline and removed intact.  POSTPROCEDURE CONDITION:  Stable.  DISCHARGE INSTRUCTIONS: 1. Resume previous diet. 2. Limitation of activities per instruction sheet.  3. Continue all current medications. 4. Follow up with me in 1-2 weeks  for repeat injection.   DD:  08/19/00 TD:  08/20/00 Job: [id number]

## 2010-05-30 NOTE — Op Note (Signed)
NAME:  Joshua Rodgers, Joshua Rodgers                ACCOUNT NO.:  [id number]   MEDICAL RECORD NO.:  [id number]          PATIENT TYPE:  AMB   LOCATION:  SDS                          FACILITY:  [facility]   PHYSICIAN:  Leonie Man, M.D.   DATE OF BIRTH:  11-29-1923   DATE OF PROCEDURE:  07/03/2005  DATE OF DISCHARGE:                                 OPERATIVE REPORT   PREOPERATIVE DIAGNOSIS:  Recurrent right inguinal hernia.   POSTOPERATIVE DIAGNOSIS:  1.  Recurrent right inguinal hernia.  2.  Right 10 cm right scrotal mass.   PROCEDURE:  1.  Repair of recurrent right inguinal hernia.  2.  Excision of 10 cm right scrotal mass.   SURGEON:  Dr. Leonie Man   ASSISTANT:  OR tech.   ANESTHESIA:  General.   SPECIMENS:  Right scrotal mass 10 x 10 cm.   ESTIMATED BLOOD LOSS:  Minimal.   There were no apparent complications and the patient returned to PACU in  excellent condition.   Joshua Rodgers is a 77 year old gentleman who underwent a right inguinal hernia  repair in the remote past.  He has had a right-sided groin bulge which  seemed to extend down into the superior portion of the scrotum.  This has  been enlarging over the past several years and he comes now to the operating  room for repair of his recurrent right inguinal hernia.  He understands the  risks and potential benefits of surgery and gives consent to same.   PROCEDURE:  Following induction of satisfactory general anesthesia with the  patient positioned supinely, the lower abdomen is prepped and draped to be  included in a sterile operative field.  The previously made oblique incision  is infiltrated with 0.5% Marcaine with epinephrine.  The old incision is  opened and the dissection was carried down through the cicatrix, deepened to  the external oblique aponeurosis.  The dissection was carried down to the  region of the external inguinal ring and where a large scrotal mass which  appeared originally to be a hernia could be located  and this was dissected  free as I elevated and held the spermatic cord with a Penrose drain.  The  mass was noted to be completely distinct from the right testicle, but  attached to the spermatic cord.  It was soft and multilobulated and  partially cystic.  The mass was dissected free from the spermatic cord and  then the base of the mass was clamped and secured with ties of 2-0 silk.  The mass was removed and forwarded for pathologic evaluation.  The  dissection was carried more superiorly and laterally up to the region of the  internal ring where a moderate size recurrent inguinal hernia was noted.  The spermatic cord was skeletonized with a large lipoma protruding through  the inguinal canal.  The internal inguinal ring was dissected free and  excised.  The defect was repaired with a polypropylene plug which was placed  into the retroperitoneal space and anchored into the fascia with silk  sutures.  The transversalis fascial defect was closed  and a overlay of  polypropylene mesh was placed over the inguinal canal sutured in at the  pubic tubercle and carried up along conjoined tendon with a 2-0 Novofil  suture and again from the pubic tubercle and 2-0 Novofil suture was anchored  then run up along the shelving edge of Poupart's ligament out to the  internal ring.  The tails of the mesh was then deployed behind the internal  ring and tacked in place in with Novofil sutures.  All areas of dissection  were then checked for hemostasis, noted to be dry.  Sponge, instrument and  sharp counts were doubly verified.  The wound closed in layers as follows.  The external oblique chondrosis was closed with running 2-0 Vicryl suture.  The Scarpa fascia and subcutaneous tissues were closed with a running 3-0  Vicryl suture and the skin was closed with a 4-0 Monocryl suture and then  reinforced with Steri-Strips.  Sterile dressings were applied to the wound.  The anesthetic reversed and the patient  removed from the operating room to  the recovery room in stable condition.  He tolerated the procedure well.      Leonie Man, M.D.  Electronically Signed     PB/MEDQ  D:  07/03/2005  T:  07/03/2005  Job:  [id number]   cc:   Wilber Bihari. Caryn Section, M.D.  Fax: [phone]

## 2010-05-30 NOTE — Procedures (Signed)
[facility]  Patient:    Joshua Rodgers, Joshua Rodgers Visit Number: [id number] MRN: [id number]          Service Type: PMG Location: TPC Attending Physician:  Thyra Breed Proc. Date: 09/02/00 Adm. Date:  08/19/2000   CC:         Harvie Junior, M.D.   Procedure Report  PROCEDURE:  Lumbar epidural steroid injection.  DIAGNOSIS:  Lumbar spinal stenosis with underlying lumbar spondylosis.  INTERVAL HISTORY:  The patient continues to note overall improvement, although he continues to be symptomatic if he stands for any period of time.  He rates his pain at 5/10.  In general, he feels better.  He is tolerating the injections with minimal side effects.  PHYSICAL EXAMINATION:  Blood pressure 131/58, heart rate 56, respiratory rate 16, O2 saturations 98%.  Pain level is 5/10.  His back shows good healing from his previous injection site.  DESCRIPTION OF PROCEDURE:  After informed consent was obtained, the patient was placed in the sitting position and monitored.  His back was prepped with Betadine x 3.  A skin wheal was raised at the L4-L5 interspace with 1% lidocaine.  A 20 gauge Tuohy needle was introduced to the lumbar epidural space to loss of resistance to preservative-free normal saline.  The depth was 5.5 cm.  There was no CSF nor blood.  A mixture of 80 mg of Medrol with 8 mL of preservative-free normal saline was gently injected.  The needle was flushed and removed intact.  POSTPROCEDURE CONDITION:  Stable.  DISCHARGE INSTRUCTIONS: 1. Resume previous diet. 2. Limitations on activities per instruction sheet. 3. Continue on current medications. 4. The patient plans to follow up with Dr. Luiz Blare.  I advised him I could see    him back in six months to consider repeat series of lumbar epidural steroid    injections. Attending Physician:  Thyra Breed DD:  09/02/00 TD:  09/02/00 Job: [id number]

## 2010-05-30 NOTE — Discharge Summary (Signed)
NAME:  Joshua Rodgers, Joshua Rodgers NO.:  [id number]   MEDICAL RECORD NO.:  [id number]                   PATIENT TYPE:  INP   LOCATION:  3707                                 FACILITY:  [facility]   PHYSICIAN:  Melvyn Novas, M.D.               DATE OF BIRTH:  1923/07/30   DATE OF ADMISSION:  04/27/2003  DATE OF DISCHARGE:  04/29/2003                                 DISCHARGE SUMMARY   A 77 year old right-handed African-American gentleman admitted on April 27, 2003 by Dr.  Lesia Sago to the stroke service.  Discharged on April 29, 2003.   The patient's original admission complaint consisted of dysequilibrium and  balance problems.   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  The  patient is a patient of the local nephrology group where he is followed for  his hyperaldosteronism, I believe.   SUMMARY OF CONSULTATIONS AND DIAGNOSTIC TESTS:  The patient had undergone an  MRI of the brain and then MRA of the neck as well as intracranial vessels.  Atrophy and chronic small vessel disease was seen.  No acute or reversible  process.  No diffusion weighted image.  Positive for stroke was noted.  Negative intracranial MR angiography of large and  medium size vessels.  Both carotid bifurcations on MRI neck appear smooth.  Both vertebral  arteries are patent.  No__________stenosis are seen.  No lesions.   IMPRESSION:  Negative MRI, MRA.   LABORATORY DATA:  The patient was admitted with a decreased white blood cell  count of 3.5K per microliter.  Showed no sign of iron deficiency anemia at  hemoglobin of 13.5, hematocrit 40.4, normal platelet count of 224 and normal  Chem-7.  The only abnormality was elevated creatinine kinase total at 364,  something that Dr. Caryn Section knows and has already followed in the  past and  elevated triglyceride level.  Homocystine was borderline elevated at 12.50  __________per liter.   DISCHARGE MEDICATIONS:  The patient will take an aspirin a day.  He further  takes:  1. Librium 10 mg one daily.  2. Atenolol 50 mg once daily.  3. Flomax 0.4 once at night.  4. Tylenol for pain as needed.  5. Iron supplements.  6. K-Klor two in the morning, two at night.  I believe those are 20 mEq     dose.  7. Norvasc once daily.  8. Amiloride 5 mg two tablets in the morning and two at night.  9. Cardura 4 mg half in a.m. and half pill in p.m.  10.      Albuterol as needed in inhaler form.   The patient will be discharged to his private home after having no  persistent neurologic deficits and no stroke by MRI.  Home  physical therapy  for his organic disorder is recommended.  That prescription is attached to  the discharge summary.                                                Melvyn Novas, M.D.    CD/MEDQ  D:  04/29/2003  T:  05/01/2003  Job:  [id number]   cc:   Marlan Palau, M.D.  [address]  [address]  [place]  [place] [address]  Fax: [phone]   Wilber Bihari. Caryn Section, M.D.  [address]  [place]  [place] [address]  Fax: [phone]

## 2010-06-13 ENCOUNTER — Encounter: Payer: Self-pay | Admitting: Orthopedic Surgery

## 2010-07-12 ENCOUNTER — Inpatient Hospital Stay (HOSPITAL_COMMUNITY)
Admission: EM | Admit: 2010-07-12 | Discharge: 2010-07-14 | DRG: 690 | Disposition: A | Discharge status: Home Health | Payer: Medicare Other | Attending: Internal Medicine | Admitting: Internal Medicine

## 2010-07-12 DIAGNOSIS — N182 Chronic kidney disease, stage 2 (mild): Secondary | ICD-10-CM | POA: Diagnosis present

## 2010-07-12 DIAGNOSIS — I129 Hypertensive chronic kidney disease with stage 1 through stage 4 chronic kidney disease, or unspecified chronic kidney disease: Secondary | ICD-10-CM | POA: Diagnosis present

## 2010-07-12 DIAGNOSIS — E876 Hypokalemia: Secondary | ICD-10-CM | POA: Diagnosis present

## 2010-07-12 DIAGNOSIS — E538 Deficiency of other specified B group vitamins: Secondary | ICD-10-CM | POA: Diagnosis present

## 2010-07-12 DIAGNOSIS — M1710 Unilateral primary osteoarthritis, unspecified knee: Secondary | ICD-10-CM | POA: Diagnosis present

## 2010-07-12 DIAGNOSIS — Z96659 Presence of unspecified artificial knee joint: Secondary | ICD-10-CM

## 2010-07-12 DIAGNOSIS — R5383 Other fatigue: Secondary | ICD-10-CM | POA: Diagnosis present

## 2010-07-12 DIAGNOSIS — R5381 Other malaise: Secondary | ICD-10-CM | POA: Diagnosis present

## 2010-07-12 DIAGNOSIS — N390 Urinary tract infection, site not specified: Principal | ICD-10-CM | POA: Diagnosis present

## 2010-07-12 DIAGNOSIS — IMO0002 Reserved for concepts with insufficient information to code with codable children: Secondary | ICD-10-CM | POA: Diagnosis present

## 2010-07-12 LAB — PRO B NATRIURETIC PEPTIDE: Pro B Natriuretic peptide (BNP): 102.3 pg/mL (ref 0–450)

## 2010-07-12 LAB — BASIC METABOLIC PANEL
Calcium: 8.9 mg/dL (ref 8.4–10.5)
Chloride: 104 mEq/L (ref 96–112)
Creatinine, Ser: 1.05 mg/dL (ref 0.50–1.35)
GFR calc Af Amer: >60 mL/min (ref >60)
Sodium: 145 mEq/L (ref 135–145)

## 2010-07-12 LAB — URINALYSIS, ROUTINE W REFLEX MICROSCOPIC
Bilirubin Urine: NEGATIVE
Ketones, ur: NEGATIVE mg/dL
Nitrite: POSITIVE — AB
Specific Gravity, Urine: 1.011 (ref 1.005–1.030)
Urobilinogen, UA: 0.2 mg/dL (ref 0.0–1.0)

## 2010-07-12 LAB — CBC
MCV: 86.3 fL (ref 78.0–100.0)
Platelets: 158 10*3/uL (ref 150–400)
RBC: 4.15 MIL/uL — ABNORMAL LOW (ref 4.22–5.81)
RDW: 14.6 % (ref 11.5–15.5)
WBC: 3.5 10*3/uL — ABNORMAL LOW (ref 4.0–10.5)

## 2010-07-12 LAB — DIFFERENTIAL
Basophils Relative: 1 % (ref 0–1)
Eosinophils Absolute: 0.1 10*3/uL (ref 0.0–0.7)
Eosinophils Relative: 2 % (ref 0–5)
Lymphs Abs: 0.8 10*3/uL (ref 0.7–4.0)
Neutrophils Relative %: 64 % (ref 43–77)

## 2010-07-13 LAB — BASIC METABOLIC PANEL
BUN: 11 mg/dL (ref 6–23)
CO2: 31 mEq/L (ref 19–32)
Calcium: 8.7 mg/dL (ref 8.4–10.5)
Chloride: 103 mEq/L (ref 96–112)
Creatinine, Ser: 0.92 mg/dL (ref 0.50–1.35)
GFR calc Af Amer: >60 mL/min (ref >60)
GFR calc non Af Amer: >60 mL/min (ref >60)
Glucose, Bld: 102 mg/dL — ABNORMAL HIGH (ref 70–99)
Potassium: 2.9 mEq/L — ABNORMAL LOW (ref 3.5–5.1)
Sodium: 142 mEq/L (ref 135–145)

## 2010-07-13 NOTE — H&P (Addendum)
NAME:  Joshua Rodgers, Joshua Rodgers NO.:  [id number]  MEDICAL RECORD NO.:  [id number]  LOCATION:  5528                         FACILITY:  [facility]  PHYSICIAN:  Mick Sell, MD DATE OF BIRTH:  [birth year]  DATE OF ADMISSION:  07/12/2010 DATE OF DISCHARGE:                             HISTORY & PHYSICAL   PRIMARY CARE PHYSICIAN:  Deirdre Peer. Nehemiah Settle, MD  NEPHROLOGIST:  Wilber Bihari. Caryn Section, MD  ORTHOPEDIST:  Harvie Junior, MD  HISTORY OF PRESENT ILLNESS:  This is a very pleasant 77 year old African American male with multiple medical problems who underwent right knee replacement on February 2012, by Dr. Luiz Blare.  After discharge home from rehabilitation center, he was able to walk around using a walker and was regaining his activities of daily living.  However, over the last 1-2 months, he has been having increasing difficulty standing and walking as well as some swelling in his legs.  He does have some mild increasing back pain and stoopd over when walking.  He otherwise has been well with no loss of appetite, weight loss, fevers, chills, night sweats, cough, abdominal pain, nausea, vomiting, diarrhea or dysuria.  PAST MEDICAL HISTORY: 1. Prostate cancer with radiation therapy. 2. Chronic kidney disease followed by Dr. Caryn Section stage II. 3. Primary hyperaldosteronism with severe refractory hypokalemia.     Followed also by Dr. Caryn Section. 4. Hypertension. 5. Hyperlipidemia. 6. Chronic vitamin D deficiency. 7. History of left-sided Bell palsy. 8. Degenerative joint disease.  PAST SURGICAL HISTORY: 1. February 15, 2010, right knee total replacement by Dr. Luiz Blare. 2. September 19, 2008, L4 laminectomy for decompression. 3. June 2007, right inguinal hernia repair.  SOCIAL HISTORY:  The patient is a retired Licensed conveyancer.  He currently lives with his wife in [place].  He denies tobacco, alcohol or drug use.  He has several sons and daughters who are very involved in  his care.  FAMILY HISTORY:  Noncontributory.  ALLERGIES:  IV CONTRAST.  MEDICATIONS:  Per the patient's medical notes that the wife has with her he is taking: 1. Amiloride 5 mg 2 tablets b.i.d. 2. Amlodipine 10 mg a day. 3. Atenolol 50 mg once a day. 4. Calcium citrate 500 mg b.i.d. 5. Donepezil 10 mg daily. 6. Ferrous sulfate 325 one b.i.d. 7. Klor-Con 20 mEq 1 b.i.d. 8. Pennsaid 1.5% topically as needed. 9. Tamsulosin 0.4 mg once a day. 10.Toviaz 8 mg in the morning. 11.Vitamin B12 1000 mg once a day. 12.Vitamin D 3 2000 mg b.i.d. 13.Meloxicam p.r.n. 14.Aleve 220 mg p.r.n. 15.Alprazolam 0.5 mg p.r.n.  REVIEW OF SYSTEMS:  Eleven systems were reviewed and are negative except as per HPI.  ADVANCED DIRECTIVES:  The patient's family does not know of any advanced directives.  The patient and his family request full code.  PHYSICAL EXAMINATION:  GENERAL:  The patient is a pleasant African American male who is alert, interactive and sitting without any acute distress in his bed. VITAL SIGNS:  Blood pressure was 166/80, heart rate of 78, he was afebrile, saturation is 98% on room air. HEENT:  Pupils equal, round and reactive to light and accommodation. Extraocular movements were intact.  Sclerae anicteric.  Oropharynx clear. NECK:  Supple.  No anterior cervical or posterior lymphadenopathy. HEART:  Distant heart sounds but regular. LUNGS:  Clear. ABDOMEN:  Soft, nontender, nondistended.  No hepatosplenomegaly. EXTREMITIES:  There is 2+ bilateral pitting edema to the mid shin. MUSCULOSKELETAL: The patient has a well-healed right total knee replacement scar. NEUROLOGY:  The patient is alert and oriented x3.  Cranial nerves II-XII were intact.  Strength is 5/5 bilateral upper extremity, lower extremity.  Reflexes are diminished bilaterally.  Lower extremity I attempted to have the patient stand and he is quite weak but can pull to stand for a few seconds.  DATA:  ECG was done  and reveals normal sinus rhythm with first-degree AV block.  No ST-T wave changes.  There is early precordial R-waves transition with this present on prior EKG from January 26, 2008.  LABORATORY DATA:  White blood count 3.5, hemoglobin 11.8, MCV 86.3, platelets 158.  Sodium 145, potassium 2.8, chloride 104, bicarb 31, BUN 13, creatinine 1.0, glucose 95, calcium 8.9, BNP 102.3.  Urinalysis positive nitrites, moderate leukocyte esterase, 7-10 white blood cells per high-power field and many bacteria.  Urine culture is pending.  IMPRESSION:  This is a very pleasant elderly African American gentleman with multiple medical problems who underwent knee replacement in February and has had a decline in his ambulatory status over the last few months.  He has no other focal complaints except for some mild swelling of his legs.  The swelling has been present for several years as well.  Physical examination reveals no focal cardiac or neurological disease.  Blood work does reveal mild urinary tract infection as well as hypokalemia which is chronic in this patient.  Differential diagnosis of his progressive weakness is likely debility following right knee replacement, polypharmacy with multiple medications including beta blockers, anticholinergic agents, hypokalemia, urinary tract infection.  PLAN: 1. We will admit the patient for observation. 2. We will have Physical Therapy evaluate the patient. 3. For the hypokalemia we have increased his potassium to 40 mEq p.o.     b.i.d. instead of 20 mEq and I had given him 40 mEq IV of     potassium. 4. Hypertension.  He will remain on amiloride, amlodipine, and     atenolol.  There was some concern, however, that the amlodipine may     be contributing to his lower extremity edema.  However, he has been     on this medication for several years per the family.  There is also     some concern atenolol may be leading to some weakness and could be      contributing to some polypharmacy. 5. For urinary tract infection, I placed him on ciprofloxacin 500     twice a day for 5 days. 6. For his vitamin B12 deficiency, we have placed him on his     outpatient vitamin B regimen. 7. The patient is full code.  DISPOSITION:  The patient will likely need evaluation by Physical Therapy and would probably benefit from acute rehab state to increase his strength.  I see no other focal findings, however, suggesting an acute or progressive neurological findings.     Mick Sell, MD     DPF/MEDQ  D:  07/12/2010  T:  07/13/2010  Job:  [id number]  cc:   Deirdre Peer. Polite, M.D. Richard F. Caryn Section, M.D.  Electronically Signed by Clydie Braun MD on 07/13/2010 12:23:13 PM

## 2010-07-14 LAB — URINE CULTURE: Colony Count: >=100000

## 2010-07-14 LAB — POTASSIUM: Potassium: 3.1 mEq/L — ABNORMAL LOW (ref 3.5–5.1)

## 2010-08-04 ENCOUNTER — Encounter: Payer: Self-pay | Admitting: Orthopedic Surgery

## 2010-08-13 ENCOUNTER — Encounter: Payer: Self-pay | Admitting: Orthopedic Surgery

## 2010-08-16 NOTE — Discharge Summary (Signed)
NAME:  Joshua Rodgers, Joshua Rodgers                ACCOUNT NO.:  [id number]  MEDICAL RECORD NO.:  [id number]  LOCATION:  5528                         FACILITY:  [facility]  PHYSICIAN:  Calvert Cantor, M.D.     DATE OF BIRTH:  10-15-1923  DATE OF ADMISSION:  07/12/2010 DATE OF DISCHARGE:  07/14/2010                              DISCHARGE SUMMARY   PRIMARY CARE PHYSICIAN:  Deirdre Peer. Polite, MD.  NEPHROLOGIST:  Wilber Bihari. Caryn Section, MD.  ORTHOPEDIC SURGEON:  Harvie Junior, MD.  PRESENTING COMPLAINT:  Difficulty ambulating.  DISCHARGE DIAGNOSES: 1. Generalized weakness and deconditioning with ambulatory     dysfunction. 2. Hypokalemia on admission. 3. Urinary tract infection, has been treated for 3 days with     ciprofloxacin. 4. Hypertension.  PAST MEDICAL HISTORY: 1. Recent right total knee replacement on February 24 by Dr. Luiz Blare. 2. History of prostate cancer with radiation treatment. 3. Chronic kidney disease, followed by Dr. Caryn Section, stage II. 4. Primary hyperaldosteronism with severe refractory hypokalemia, also     followed by Dr. Caryn Section. 5. Hyperlipidemia. 6. Chronic vitamin D deficiency. 7. Left-sided Bell's palsy. 8. Degenerative joint disease. 9. Urinary tract infection being treated with ciprofloxacin.  DISCHARGE MEDICATIONS:  New medication: 1. Ciprofloxacin 500 mg b.i.d. take for two more days. 2. Senna two tablets daily as needed. 3. Potassium chloride increased to 20 mEq two tablets twice a day.  Continue the following: 1. Alprazolam 0.5 mg one tablet daily as needed. 2. Amiloride 5 mg two tablets twice a day. 3. Amlodipine 10 mg daily. 4. Atenolol 50 mg daily. 5. Calcium citrate 500 mg twice a day. 6. Donepezil 10 mg daily. 7. Ferrous sulfate 325 mg twice a day. 8. Tamsulosin 0.4 mg daily at bedtime. 9. Toviaz 8 mg daily. 10.Vitamin B12 1000 mcg daily. 11.Vitamin D3 2000 units twice a day.  HOSPITAL COURSE:  This is an 77 year old male, who underwent right knee replacement on  February.  He subsequently did well after rehab; however, over the past 1-2 months, he and his wife noted a steady decline in his ambulatory ability.  He now complains of pain in the left knee.  He also complains of some lower back pain and stooping when he walks.  The patient was also noted to be hypokalemic on admission, which is a chronic problem secondary to his hyperaldosteronism.  The patient was admitted.  PT/OT evaluation was requested.  Potassium was replaced and the urinary tract infection was treated with ciprofloxacin.  Physical therapy and occupational therapy has evaluated him and determined that he needs home health PT and OT, which have been set up for him.  His potassium has been increased as mentioned above.  Urinary tract infection.  We will continue to be treated for two more days with ciprofloxacin.  Urine has grown out greater than 100,000 colonies of E. coli, which are sensitive to ciprofloxacin.  Potassium has improved to 3.1, the last checked July 14, 2010.  PHYSICAL EXAM:  LUNGS:  Clear bilaterally. HEART:  Regular rate and rhythm.  No murmurs. EXTREMITIES:  No cyanosis, clubbing, or edema.  CONDITION ON DISCHARGE:  Stable.  FOLLOWUP INSTRUCTIONS:  He should follow up with Dr.  Renford Dills in 1 week and have his potassium checked.  Time on patient care today was 45 minutes.     Calvert Cantor, M.D.     SR/MEDQ  D:  08/15/2010  T:  08/15/2010  Job:  [id number]  cc:   Deirdre Peer. Polite, M.D.  Electronically Signed by Calvert Cantor M.D. on 08/16/2010 01:17:23 PM

## 2010-10-01 LAB — BASIC METABOLIC PANEL
BUN: 12
CO2: 33 — ABNORMAL HIGH
Calcium: 8.7
Calcium: 8.7
Calcium: 9.2
Creatinine, Ser: 0.75
Creatinine, Ser: 1.01
GFR calc Af Amer: >60
GFR calc Af Amer: >60
GFR calc non Af Amer: >60
GFR calc non Af Amer: >60
GFR calc non Af Amer: >60
Glucose, Bld: 95
Potassium: 3.5
Sodium: 141
Sodium: 142
Sodium: 143

## 2010-10-01 LAB — CBC
HCT: 37.8 — ABNORMAL LOW
Hemoglobin: 12.6 — ABNORMAL LOW
Hemoglobin: 12.7 — ABNORMAL LOW
MCHC: 34.1
Platelets: 190
RDW: 13.9
RDW: 14

## 2010-10-01 LAB — CK TOTAL AND CKMB (NOT AT ARMC)
CK, MB: 2.7
CK, MB: 2.8
Relative Index: 1
Relative Index: 1
Relative Index: 1
Relative Index: 1.1
Total CK: 257 — ABNORMAL HIGH

## 2010-10-01 LAB — LIPID PANEL: Triglycerides: 143

## 2010-10-01 LAB — COMPREHENSIVE METABOLIC PANEL
ALT: 17
AST: 22
Albumin: 3.8
Alkaline Phosphatase: 52
BUN: 11
Chloride: 105
GFR calc Af Amer: >60
Potassium: 3.2 — ABNORMAL LOW
Total Bilirubin: 0.5

## 2010-10-01 LAB — DIFFERENTIAL
Basophils Absolute: 0
Basophils Relative: 1
Eosinophils Absolute: 0
Eosinophils Relative: 1
Monocytes Absolute: 0.3

## 2010-10-01 LAB — IRON AND TIBC
Saturation Ratios: 23
TIBC: 286

## 2010-10-01 LAB — HOMOCYSTEINE: Homocysteine: 7.2

## 2010-10-01 LAB — TSH: TSH: 0.997

## 2010-10-01 LAB — TROPONIN I
Troponin I: 0.02
Troponin I: 0.04

## 2011-01-09 ENCOUNTER — Other Ambulatory Visit (HOSPITAL_COMMUNITY): Payer: Self-pay | Admitting: Internal Medicine

## 2011-01-12 ENCOUNTER — Ambulatory Visit (HOSPITAL_COMMUNITY)
Admission: RE | Admit: 2011-01-12 | Discharge: 2011-01-12 | Disposition: A | Discharge status: Home / Self Care | Payer: Medicare Other | Source: Ambulatory Visit | Attending: Internal Medicine | Admitting: Internal Medicine

## 2011-01-12 DIAGNOSIS — R1310 Dysphagia, unspecified: Secondary | ICD-10-CM | POA: Insufficient documentation

## 2011-01-12 NOTE — Procedures (Signed)
Modified Barium Swallow Procedure Note Patient Details  Name: Joshua Rodgers MRN: [id number] Date of Birth: October 01, 1923  Today's Date: 01/12/2011    HPI: HPI: 77 y.o. male referred for OPMBS secondary to increased coughing when swallowing liquids and meds; reports of food "not going down."  Pt describes saliva sticking to mouth and cheeks.  Hx of Bell's palsy; back surgery; uses wheelchair when he can't ambulate long distances.    Recommendation/Prognosis  Dysphagia Diagnosis: Within Functional Limits Clinical impression: Pt presents with normal oropharyngeal swallow with adequate bolus transfer and airway protection.  Appearance of likely osteophytes at C4-5, C5-6, C6-7 did not interfere with epiglottic inversion (presence not confirmed by radiologist).  Cervical esophageal screen revealed  barium clearance.    Recommendations Solid Consistency: Regular Liquid Consistency: Thin Medication Administration: Whole meds with puree Supervision: Patient able to self feed Follow up Recommendations: None      General:  Type of Study: Initial MBS Diet Prior to this Study: Regular;Thin liquids Temperature Spikes Noted: No Respiratory Status: Room air Behavior/Cognition: Alert;Cooperative;Pleasant mood Oral Cavity - Dentition: Adequate natural dentition Oral Motor / Sensory Function: Within functional limits Vision: Functional for self-feeding Patient Positioning: Upright in chair Baseline Vocal Quality: Normal Volitional Cough: Strong Volitional Swallow: Able to elicit Anatomy: Within functional limits Ice chips: Tested (comment)   Oral Phase Oral Preparation/Oral Phase Oral Phase: WFL Pharyngeal Phase  Pharyngeal Phase Pharyngeal Phase: Within functional limits Cervical Esophageal Phase  Cervical Esophageal Phase Cervical Esophageal Phase: [facility]   Taggart Prasad L. Samson Frederic, [place] CCC/SLP Pager [phone]  Blenda Mounts Laurice 01/12/2011, 12:21 PM

## 2011-06-01 ENCOUNTER — Other Ambulatory Visit: Payer: Self-pay

## 2011-06-01 ENCOUNTER — Emergency Department (HOSPITAL_COMMUNITY)
Admission: EM | Admit: 2011-06-01 | Discharge: 2011-06-01 | Disposition: A | Discharge status: Home / Self Care | Payer: Medicare Other | Attending: Emergency Medicine | Admitting: Emergency Medicine

## 2011-06-01 ENCOUNTER — Encounter (HOSPITAL_COMMUNITY): Payer: Self-pay

## 2011-06-01 ENCOUNTER — Emergency Department (HOSPITAL_COMMUNITY): Payer: Medicare Other

## 2011-06-01 DIAGNOSIS — Z8546 Personal history of malignant neoplasm of prostate: Secondary | ICD-10-CM | POA: Insufficient documentation

## 2011-06-01 DIAGNOSIS — M545 Low back pain, unspecified: Secondary | ICD-10-CM | POA: Insufficient documentation

## 2011-06-01 DIAGNOSIS — E876 Hypokalemia: Secondary | ICD-10-CM

## 2011-06-01 DIAGNOSIS — Z87891 Personal history of nicotine dependence: Secondary | ICD-10-CM | POA: Insufficient documentation

## 2011-06-01 DIAGNOSIS — R531 Weakness: Secondary | ICD-10-CM

## 2011-06-01 DIAGNOSIS — N390 Urinary tract infection, site not specified: Secondary | ICD-10-CM

## 2011-06-01 DIAGNOSIS — M549 Dorsalgia, unspecified: Secondary | ICD-10-CM | POA: Insufficient documentation

## 2011-06-01 DIAGNOSIS — R5381 Other malaise: Secondary | ICD-10-CM | POA: Insufficient documentation

## 2011-06-01 HISTORY — DX: Hyperlipidemia, unspecified: E78.5

## 2011-06-01 HISTORY — DX: Weakness: R53.1

## 2011-06-01 HISTORY — DX: Hypokalemia: E87.6

## 2011-06-01 HISTORY — DX: Unspecified osteoarthritis, unspecified site: M19.90

## 2011-06-01 HISTORY — DX: Spinal stenosis, lumbar region without neurogenic claudication: M48.061

## 2011-06-01 HISTORY — DX: Disorder of kidney and ureter, unspecified: N28.9

## 2011-06-01 HISTORY — DX: Bell's palsy: G51.0

## 2011-06-01 LAB — CBC
HCT: 36.7 % — ABNORMAL LOW (ref 39.0–52.0)
Hemoglobin: 11.9 g/dL — ABNORMAL LOW (ref 13.0–17.0)
MCH: 29.8 pg (ref 26.0–34.0)
MCHC: 32.4 g/dL (ref 30.0–36.0)
MCV: 92 fL (ref 78.0–100.0)
Platelets: 189 10*3/uL (ref 150–400)
RBC: 3.99 MIL/uL — ABNORMAL LOW (ref 4.22–5.81)
RDW: 14.2 % (ref 11.5–15.5)
WBC: 5.8 10*3/uL (ref 4.0–10.5)

## 2011-06-01 LAB — BASIC METABOLIC PANEL WITH GFR
BUN: 15 mg/dL (ref 6–23)
Calcium: 9.5 mg/dL (ref 8.4–10.5)
Chloride: 103 meq/L (ref 96–112)
Creatinine, Ser: 1.07 mg/dL (ref 0.50–1.35)
GFR calc Af Amer: 71 mL/min — ABNORMAL LOW (ref >90)

## 2011-06-01 LAB — DIFFERENTIAL
Basophils Absolute: 0 K/uL (ref 0.0–0.1)
Basophils Relative: 0 % (ref 0–1)
Eosinophils Absolute: 0.1 K/uL (ref 0.0–0.7)
Eosinophils Relative: 1 % (ref 0–5)
Lymphocytes Relative: 18 % (ref 12–46)
Lymphs Abs: 1.1 10*3/uL (ref 0.7–4.0)
Monocytes Absolute: 0.5 K/uL (ref 0.1–1.0)
Monocytes Relative: 8 % (ref 3–12)
Neutro Abs: 4.2 K/uL (ref 1.7–7.7)
Neutrophils Relative %: 73 % (ref 43–77)

## 2011-06-01 LAB — BASIC METABOLIC PANEL
CO2: 33 mEq/L — ABNORMAL HIGH (ref 19–32)
GFR calc non Af Amer: 61 mL/min — ABNORMAL LOW (ref >90)
Glucose, Bld: 95 mg/dL (ref 70–99)
Potassium: 3.2 mEq/L — ABNORMAL LOW (ref 3.5–5.1)
Sodium: 145 mEq/L (ref 135–145)

## 2011-06-01 LAB — URINALYSIS, ROUTINE W REFLEX MICROSCOPIC
Nitrite: POSITIVE — AB
Specific Gravity, Urine: 1.008 (ref 1.005–1.030)
Urobilinogen, UA: 0.2 mg/dL (ref 0.0–1.0)

## 2011-06-01 LAB — URINE MICROSCOPIC-ADD ON

## 2011-06-01 MED ORDER — POTASSIUM CHLORIDE CRYS ER 20 MEQ PO TBCR
40.0000 meq | EXTENDED_RELEASE_TABLET | Freq: Once | ORAL | Status: AC
  Administered 2011-06-01: 40 meq via ORAL
  Filled 2011-06-01: qty 2

## 2011-06-01 MED ORDER — DEXTROSE 5 % IV SOLN
1.0000 g | Freq: Once | INTRAVENOUS | Status: AC
  Administered 2011-06-01: 1 g via INTRAVENOUS
  Filled 2011-06-01: qty 10

## 2011-06-01 MED ORDER — SODIUM CHLORIDE 0.9 % IV BOLUS (SEPSIS)
500.0000 mL | Freq: Once | INTRAVENOUS | Status: AC
  Administered 2011-06-01: 500 mL via INTRAVENOUS

## 2011-06-01 MED ORDER — CIPROFLOXACIN HCL 500 MG PO TABS: 500.0000 mg | ORAL_TABLET | Freq: Two times a day (BID) | ORAL | Status: AC

## 2011-06-01 MED ORDER — POTASSIUM CHLORIDE 10 MEQ/100ML IV SOLN
10.0000 meq | Freq: Once | INTRAVENOUS | Status: AC
  Administered 2011-06-01: 10 meq via INTRAVENOUS
  Filled 2011-06-01: qty 100

## 2011-06-01 NOTE — Discharge Instructions (Signed)
Hypokalemia Hypokalemia means a low potassium level in the blood.Potassium is an electrolyte that helps regulate the amount of fluid in the body. It also stimulates muscle contraction and maintains a stable acid-base balance.Most of the body's potassium is inside of cells, and only a very small amount is in the blood. Because the amount in the blood is so small, minor changes can have big effects. PREPARATION FOR TEST Testing for potassium requires taking a blood sample taken by needle from a vein in the arm. The skin is cleaned thoroughly before the sample is drawn. There is no other special preparation needed. NORMAL VALUES Potassium levels below 3.5 mEq/L are abnormally low. Levels above 5.1 mEq/L are abnormally high. Ranges for normal findings may vary among different laboratories and hospitals. You should always check with your doctor after having lab work or other tests done to discuss the meaning of your test results and whether your values are considered within normal limits. MEANING OF TEST  Your caregiver will go over the test results with you and discuss the importance and meaning of your results, as well as treatment options and the need for additional tests, if necessary. A potassium level is frequently part of a routine medical exam. It is usually included as part of a whole "panel" of tests for several blood salts (such as Sodium and Chloride). It may be done as part of follow-up when a low potassium level was found in the past or other blood salts are suspected of being out of balance. A low potassium level might be suspected if you have one or more of the following:  Symptoms of weakness.   Abnormal heart rhythms.   High blood pressure and are taking medication to control this, especially water pills (diuretics).   Kidney disease that can affect your potassium level .   Diabetes requiring the use of insulin. The potassium may fall after taking insulin, especially if the  diabetes had been out of control for a while.   A condition requiring the use of cortisone-type medication or certain types of antibiotics.   Vomiting and/or diarrhea for more than a day or two.   A stomach or intestinal condition that may not permit appropriate absorption of potassium.   Fainting episodes.   Mental confusion.  OBTAINING TEST RESULTS It is your responsibility to obtain your test results. Ask the lab or department performing the test when and how you will get your results.  Please contact your caregiver directly if you have not received the results within one week. At that time, ask if there is anything different or new you should be doing in relation to the results. TREATMENT Hypokalemia can be treated with potassium supplements taken by mouth and/or adjustments in your current medications. A diet high in potassium is also helpful. Foods with high potassium content are:  Peas, lentils, lima beans, nuts, and dried fruit.   Whole grain and bran cereals and breads.   Fresh fruit, vegetables (bananas, cantaloupe, grapefruit, oranges, tomatoes, honeydew melons, potatoes).   Orange and tomato juices.   Meats. If potassium supplement has been prescribed for you today or your medications have been adjusted, see your personal caregiver in time02 for a re-check.  SEEK MEDICAL CARE IF:  There is a feeling of worsening weakness.   You experience repeated chest palpitations.   You are diabetic and having difficulty keeping your blood sugars in the normal range.   You are experiencing vomiting and/or diarrhea.   You are having  difficulty with any of your regular medications.  SEEK IMMEDIATE MEDICAL CARE IF:  You experience chest pain, shortness of breath, or episodes of dizziness.   You have been having vomiting or diarrhea for more than 2 days.   You have a fainting episode.  MAKE SURE YOU:   Understand these instructions.   Will watch your condition.   Will  get help right away if you are not doing well or get worse.  Document Released: 12/29/2004 Document Revised: 12/18/2010 Document Reviewed: 12/10/2007 [facility] Patient Information 2012 [place], [place].    Urinary Tract Infection Infections of the urinary tract can start in several places. A bladder infection (cystitis), a kidney infection (pyelonephritis), and a prostate infection (prostatitis) are different types of urinary tract infections (UTIs). They usually get better if treated with medicines (antibiotics) that kill germs. Take all the medicine until it is gone. You or your child may feel better in a few days, but TAKE ALL MEDICINE or the infection may not respond and may become more difficult to treat. HOME CARE INSTRUCTIONS   Drink enough water and fluids to keep the urine clear or pale yellow. Cranberry juice is especially recommended, in addition to large amounts of water.   Avoid caffeine, tea, and carbonated beverages. They tend to irritate the bladder.   Alcohol may irritate the prostate.   Only take over-the-counter or prescription medicines for pain, discomfort, or fever as directed by your caregiver.  To prevent further infections:  Empty the bladder often. Avoid holding urine for long periods of time.   After a bowel movement, women should cleanse from front to back. Use each tissue only once.   Empty the bladder before and after sexual intercourse.  FINDING OUT THE RESULTS OF YOUR TEST Not all test results are available during your visit. If your or your child's test results are not back during the visit, make an appointment with your caregiver to find out the results. Do not assume everything is normal if you have not heard from your caregiver or the medical facility. It is important for you to follow up on all test results. SEEK IMMEDIATE MEDICAL CARE IF:   There is severe back pain or lower abdominal pain.   There is nausea or vomiting.   There is continued burning  or discomfort with urination.  MAKE SURE YOU:   Understand these instructions.   Will watch your condition.   Will get help right away if you are not doing well or get worse.  Document Released: 10/08/2004 Document Revised: 12/18/2010 Document Reviewed: 05/13/2006 [facility] Patient Information 2012 [place], [place].

## 2011-06-01 NOTE — ED Notes (Signed)
Pt has a condom cath not an indwelling foley cath as spouse had originally indicated

## 2011-06-01 NOTE — ED Notes (Signed)
Per spouse his urologist inserted foley catheter d/t urinary incontinence

## 2011-06-01 NOTE — ED Notes (Signed)
Pt reports hx of chronic low back pain d/t Lumbar stenosis, but reports increase lower back pain today and increased generalized weakness. Pt presents w/an indwelling foley catheter that has been in place x1 month, wife denies any instructions for f/u plan of care for foley cath maintenance, pt also has BLE swelling w/+2 pitting edema, spouse reports pt has been productive coughing "for a while" w/white colored sputum. Pt denies chest/abd pain, N/V, dizziness, headache, diaphoresis, or fever. Pt does have a (L) side facial droop, per spouse pt has had that same droop w/no change for 40 years d/t Bells Palsy. NAD, neuro is intact

## 2011-06-01 NOTE — ED Notes (Signed)
Per EMS pt from home, pt c/o lower back pain and generalized weakness, pt usually ambulatory w/walker and motorized wheelchair, pt has a hx of Bells Palsy w/(L) side facial droop, per family on scene that is d/t his Bells Palsy. Equal grips, strengths, PERRL

## 2011-06-01 NOTE — ED Notes (Signed)
MD at bedside. Dr. Ghim. 

## 2011-06-01 NOTE — ED Provider Notes (Signed)
History     CSN: [id number]  Arrival date & time 06/01/11  0865   First MD Initiated Contact with Patient 06/01/11 1012      Chief Complaint  Patient presents with  . Weakness    (Consider location/radiation/quality/duration/timing/severity/associated sxs/prior treatment) HPI Comments: Pt lives at home with spouse, has gait problems at baseline due to nerve problem in lower back, uses a walker for short distances only, recently began with a foley catheter to help keep clean overall, was in usual state of health last night, had eaten dinner, and this AM couldn't lift his arms, couldn't stand up even with assistance.  No HA, CP, SOB, abd pain.  He gets low back pain intermittently and was a little worse than usual today, but no recent injury, fever, chills, rash.  He feels stronger now since arrival, now can pick up legs and arms off of bed.  Mentation is baseline per family.    Patient is a 78 y.o. male presenting with weakness. The history is provided by the patient, the spouse, a relative and medical records.  Weakness Primary symptoms do not include headaches or fever.  Additional symptoms include weakness.    Past Medical History  Diagnosis Date  . Generalized weakness   . Hypokalemia   . Renal disorder   . UTI (lower urinary tract infection)   . Cancer     prostate   . Hyperlipidemia   . Vitamin d deficiency   . Bell's palsy   . DJD (degenerative joint disease)   . Lumbar stenosis     Past Surgical History  Procedure Date  . Total knee arthroplasty     History reviewed. No pertinent family history.  History  Substance Use Topics  . Smoking status: Former Games developer  . Smokeless tobacco: Not on file  . Alcohol Use: No      Review of Systems  Constitutional: Negative for fever, chills and fatigue.  Respiratory: Negative for cough and shortness of breath.   Cardiovascular: Negative for chest pain.  Gastrointestinal: Negative for abdominal pain.    Musculoskeletal: Positive for back pain.  Neurological: Positive for weakness. Negative for numbness and headaches.  All other systems reviewed and are negative.    Allergies  Iohexol  Home Medications   Current Outpatient Rx  Name Route Sig Dispense Refill  . ALPRAZOLAM 0.5 MG PO TABS Oral Take 0.5 mg by mouth 3 (three) times daily as needed. For anxiety.    . AMILORIDE HCL 5 MG PO TABS Oral Take 10 mg by mouth 2 (two) times daily.    Marland Kitchen AMLODIPINE BESYLATE 10 MG PO TABS Oral Take 10 mg by mouth every morning.    . ATENOLOL 50 MG PO TABS Oral Take 50 mg by mouth every morning.    Marland Kitchen CALTRATE 600 PLUS-VIT D PO Oral Take 1 tablet by mouth 2 (two) times daily.    . CHOLECALCIFEROL 2000 UNITS PO CAPS Oral Take 1 capsule by mouth 2 (two) times daily.    Marland Kitchen CITALOPRAM HYDROBROMIDE 10 MG PO TABS Oral Take 10 mg by mouth daily.    Marland Kitchen DICLOFENAC SODIUM 1.5 % TD SOLN Transdermal Place 1 drop onto the skin daily as needed. For pain.    . DONEPEZIL HCL 10 MG PO TABS Oral Take 10 mg by mouth every morning.    Marland Kitchen FERROUS SULFATE 324 (65 FE) MG PO TBEC Oral Take 1 tablet by mouth 2 (two) times daily.    Marland Kitchen GLUCOSAMINE CHONDROITIN COMPLX  PO Oral Take 1 tablet by mouth daily.    Marland Kitchen MIRABEGRON ER 25 MG PO TB24 Oral Take 25 mg by mouth daily.    Marland Kitchen NAPROXEN SODIUM 220 MG PO TABS Oral Take 220 mg by mouth daily as needed. For pain.    Marland Kitchen OVER THE COUNTER MEDICATION Oral Take 500 mg by mouth 2 (two) times daily. Calcium Citrate.    . OXYBUTYNIN CHLORIDE ER 5 MG PO TB24 Oral Take 5 mg by mouth daily.    Marland Kitchen POTASSIUM CHLORIDE CRYS ER 20 MEQ PO TBCR Oral Take 20 mEq by mouth 2 (two) times daily.    Marland Kitchen TAMSULOSIN HCL 0.4 MG PO CAPS Oral Take 0.8 mg by mouth at bedtime.      BP 165/86  Pulse 64  Temp(Src) 97.8 F (36.6 C) (Oral)  Resp 18  SpO2 98%  Physical Exam  Nursing note and vitals reviewed. Constitutional: He appears well-developed and well-nourished.  HENT:  Head: Normocephalic and atraumatic.   Eyes: Pupils are equal, round, and reactive to light.  Cardiovascular: Normal rate and regular rhythm.   Pulmonary/Chest: Effort normal. No respiratory distress. He has no wheezes. He has no rales.  Abdominal: Soft. Normal appearance and bowel sounds are normal. There is no tenderness. There is no rebound, no guarding and no CVA tenderness.  Musculoskeletal:       Thoracic back: He exhibits no tenderness, no bony tenderness and no edema.       Lumbar back: He exhibits no tenderness, no bony tenderness and no deformity.  Neurological: He is alert. No sensory deficit.       4-/5 strength in all 4 extremities.    Skin: Skin is warm and dry. No rash noted.    ED Course  Procedures (including critical care time)  Labs Reviewed  URINALYSIS, ROUTINE W REFLEX MICROSCOPIC - Abnormal; Notable for the following:    APPearance CLOUDY (*)    Hgb urine dipstick TRACE (*)    Nitrite POSITIVE (*)    Leukocytes, UA LARGE (*)    All other components within normal limits  CBC - Abnormal; Notable for the following:    RBC 3.99 (*)    Hemoglobin 11.9 (*)    HCT 36.7 (*)    All other components within normal limits  URINE MICROSCOPIC-ADD ON - Abnormal; Notable for the following:    Bacteria, UA MANY (*)    All other components within normal limits  DIFFERENTIAL  BASIC METABOLIC PANEL  URINE CULTURE   No results found.   No diagnosis found.  RA sat is 98% and normal  ECG at time 10:14 shows SR at rate 67, prolonged PR interval at 232 ms, first degree AV block, normal axis, no ST or T wave abn's.  Similar to ECG from 07/12/10.  1:43 PM Pt reports feeling improved overall.  Pt and family informed that he has UTI, K+ is mildly low, both of which may be contributing to his overall symptoms.  NO ischemia on ECG.  Not sig anemic.  CXR shows no acute abn's.  Pt is moving 4 extremities more easily.  I explained to pt and family that at this time admission isn't warranted unless he is not back to his  baseline, however he appears to be at his baseline.  He requires sig assistance to get out of bed and to walk.  Will test him here after all meds and IVF's have been given to be sure.    3:52 PM Pt  feels improved, feels he can go home.  I have also spoken to Dr. Nehemiah Settle who asks that they call his office to see him sometime this week.     MDM  Pt with evidence of complicated UTI with UA showing nitrites, LE and pt having generalized weakness as well as back pain.  No fever, not hypotensive, VS are otherwise ok and mentation is ok.  WBC is WNL.  Will give dose of IV abx, change foley and reassess for improvement after IVF's.  If not clinically improved, cannot ambulate with assistance as usual, will consider admission.          Gavin Pound. Lakeya Mulka, MD 06/01/11 1610

## 2011-06-03 LAB — URINE CULTURE
Colony Count: >=100000
Culture  Setup Time: 201305201415

## 2011-06-04 NOTE — ED Notes (Signed)
+   Urine Patient treated with Cipro-sensitive to same-chart appended per protocol MD. 

## 2011-06-18 ENCOUNTER — Inpatient Hospital Stay (HOSPITAL_COMMUNITY)
Admission: AD | Admit: 2011-06-18 | Discharge: 2011-06-22 | DRG: 301 | Disposition: A | Discharge status: Home Health | Payer: Medicare Other | Source: Ambulatory Visit | Attending: Internal Medicine | Admitting: Internal Medicine

## 2011-06-18 ENCOUNTER — Encounter (HOSPITAL_COMMUNITY): Payer: Self-pay | Admitting: General Practice

## 2011-06-18 DIAGNOSIS — F3289 Other specified depressive episodes: Secondary | ICD-10-CM | POA: Diagnosis present

## 2011-06-18 DIAGNOSIS — Z86718 Personal history of other venous thrombosis and embolism: Secondary | ICD-10-CM | POA: Diagnosis present

## 2011-06-18 DIAGNOSIS — N312 Flaccid neuropathic bladder, not elsewhere classified: Secondary | ICD-10-CM | POA: Diagnosis present

## 2011-06-18 DIAGNOSIS — R5381 Other malaise: Secondary | ICD-10-CM

## 2011-06-18 DIAGNOSIS — F0390 Unspecified dementia without behavioral disturbance: Secondary | ICD-10-CM

## 2011-06-18 DIAGNOSIS — Z8546 Personal history of malignant neoplasm of prostate: Secondary | ICD-10-CM

## 2011-06-18 DIAGNOSIS — M7989 Other specified soft tissue disorders: Secondary | ICD-10-CM

## 2011-06-18 DIAGNOSIS — E876 Hypokalemia: Secondary | ICD-10-CM | POA: Diagnosis present

## 2011-06-18 DIAGNOSIS — G510 Bell's palsy: Secondary | ICD-10-CM | POA: Diagnosis present

## 2011-06-18 DIAGNOSIS — Z79899 Other long term (current) drug therapy: Secondary | ICD-10-CM

## 2011-06-18 DIAGNOSIS — I10 Essential (primary) hypertension: Secondary | ICD-10-CM

## 2011-06-18 DIAGNOSIS — M48061 Spinal stenosis, lumbar region without neurogenic claudication: Secondary | ICD-10-CM | POA: Diagnosis present

## 2011-06-18 DIAGNOSIS — R5383 Other fatigue: Secondary | ICD-10-CM

## 2011-06-18 DIAGNOSIS — Z7901 Long term (current) use of anticoagulants: Secondary | ICD-10-CM

## 2011-06-18 DIAGNOSIS — E785 Hyperlipidemia, unspecified: Secondary | ICD-10-CM | POA: Diagnosis present

## 2011-06-18 DIAGNOSIS — R600 Localized edema: Secondary | ICD-10-CM

## 2011-06-18 DIAGNOSIS — F329 Major depressive disorder, single episode, unspecified: Secondary | ICD-10-CM | POA: Diagnosis present

## 2011-06-18 DIAGNOSIS — I824Y9 Acute embolism and thrombosis of unspecified deep veins of unspecified proximal lower extremity: Principal | ICD-10-CM | POA: Diagnosis present

## 2011-06-18 DIAGNOSIS — K5900 Constipation, unspecified: Secondary | ICD-10-CM | POA: Diagnosis present

## 2011-06-18 DIAGNOSIS — Z96659 Presence of unspecified artificial knee joint: Secondary | ICD-10-CM

## 2011-06-18 HISTORY — DX: Depression, unspecified: F32.A

## 2011-06-18 HISTORY — DX: Essential (primary) hypertension: I10

## 2011-06-18 HISTORY — DX: Unspecified dementia, unspecified severity, without behavioral disturbance, psychotic disturbance, mood disturbance, and anxiety: F03.90

## 2011-06-18 HISTORY — DX: Major depressive disorder, single episode, unspecified: F32.9

## 2011-06-18 LAB — BASIC METABOLIC PANEL
BUN: 14 mg/dL (ref 6–23)
GFR calc non Af Amer: 63 mL/min — ABNORMAL LOW (ref >90)
Glucose, Bld: 146 mg/dL — ABNORMAL HIGH (ref 70–99)
Potassium: 3.3 mEq/L — ABNORMAL LOW (ref 3.5–5.1)

## 2011-06-18 LAB — CBC
HCT: 32.9 % — ABNORMAL LOW (ref 39.0–52.0)
Hemoglobin: 10.6 g/dL — ABNORMAL LOW (ref 13.0–17.0)
MCH: 29.7 pg (ref 26.0–34.0)
MCHC: 32.2 g/dL (ref 30.0–36.0)

## 2011-06-18 MED ORDER — SODIUM CHLORIDE 0.9 % IJ SOLN
3.0000 mL | Freq: Two times a day (BID) | INTRAMUSCULAR | Status: DC
  Administered 2011-06-18 – 2011-06-21 (×5): 3 mL via INTRAVENOUS
  Administered 2011-06-22: 10:00:00 via INTRAVENOUS

## 2011-06-18 MED ORDER — ENOXAPARIN SODIUM 40 MG/0.4ML ~~LOC~~ SOLN
40.0000 mg | SUBCUTANEOUS | Status: DC
  Administered 2011-06-18: 40 mg via SUBCUTANEOUS
  Filled 2011-06-18 (×2): qty 0.4

## 2011-06-18 MED ORDER — AMILORIDE HCL 5 MG PO TABS
10.0000 mg | ORAL_TABLET | Freq: Two times a day (BID) | ORAL | Status: DC
  Administered 2011-06-19 – 2011-06-22 (×5): 10 mg via ORAL
  Filled 2011-06-18 (×12): qty 2

## 2011-06-18 MED ORDER — POTASSIUM CHLORIDE CRYS ER 20 MEQ PO TBCR
20.0000 meq | EXTENDED_RELEASE_TABLET | Freq: Two times a day (BID) | ORAL | Status: DC
  Administered 2011-06-18 – 2011-06-22 (×8): 20 meq via ORAL
  Filled 2011-06-18 (×9): qty 1

## 2011-06-18 MED ORDER — NAPROXEN 250 MG PO TABS
250.0000 mg | ORAL_TABLET | Freq: Every day | ORAL | Status: DC | PRN
  Filled 2011-06-18: qty 1

## 2011-06-18 MED ORDER — ATENOLOL 50 MG PO TABS
50.0000 mg | ORAL_TABLET | Freq: Every morning | ORAL | Status: DC
  Administered 2011-06-19 – 2011-06-22 (×4): 50 mg via ORAL
  Filled 2011-06-18 (×4): qty 1

## 2011-06-18 MED ORDER — VITAMIN D3 25 MCG (1000 UNIT) PO TABS
2000.0000 [IU] | ORAL_TABLET | Freq: Two times a day (BID) | ORAL | Status: DC
  Administered 2011-06-18 – 2011-06-22 (×8): 2000 [IU] via ORAL
  Filled 2011-06-18 (×11): qty 2

## 2011-06-18 MED ORDER — NAPROXEN SODIUM 220 MG PO TABS: 220.0000 mg | ORAL_TABLET | Freq: Every day | ORAL | Status: DC | PRN

## 2011-06-18 MED ORDER — DONEPEZIL HCL 10 MG PO TABS
10.0000 mg | ORAL_TABLET | Freq: Every morning | ORAL | Status: DC
  Administered 2011-06-19 – 2011-06-22 (×4): 10 mg via ORAL
  Filled 2011-06-18 (×4): qty 1

## 2011-06-18 MED ORDER — FERROUS SULFATE 324 (65 FE) MG PO TBEC
1.0000 | DELAYED_RELEASE_TABLET | Freq: Two times a day (BID) | ORAL | Status: DC
Start: 2011-06-18 — End: 2011-06-18

## 2011-06-18 MED ORDER — MIRABEGRON ER 25 MG PO TB24
25.0000 mg | ORAL_TABLET | Freq: Every day | ORAL | Status: DC
  Administered 2011-06-19 – 2011-06-22 (×4): 25 mg via ORAL
  Filled 2011-06-18 (×4): qty 1

## 2011-06-18 MED ORDER — ALPRAZOLAM 0.5 MG PO TABS
0.5000 mg | ORAL_TABLET | Freq: Three times a day (TID) | ORAL | Status: DC | PRN
  Filled 2011-06-18: qty 1

## 2011-06-18 MED ORDER — FERROUS SULFATE 325 (65 FE) MG PO TABS
325.0000 mg | ORAL_TABLET | Freq: Two times a day (BID) | ORAL | Status: DC
  Administered 2011-06-18 – 2011-06-22 (×8): 325 mg via ORAL
  Filled 2011-06-18 (×11): qty 1

## 2011-06-18 MED ORDER — SODIUM CHLORIDE 0.9 % IV SOLN: 250.0000 mL | INTRAVENOUS | Status: DC | PRN

## 2011-06-18 MED ORDER — OXYBUTYNIN CHLORIDE ER 5 MG PO TB24
5.0000 mg | ORAL_TABLET | Freq: Every day | ORAL | Status: DC
  Administered 2011-06-19 – 2011-06-22 (×4): 5 mg via ORAL
  Filled 2011-06-18 (×4): qty 1

## 2011-06-18 MED ORDER — TAMSULOSIN HCL 0.4 MG PO CAPS
0.8000 mg | ORAL_CAPSULE | Freq: Every day | ORAL | Status: DC
  Administered 2011-06-18 – 2011-06-21 (×4): 0.8 mg via ORAL
  Filled 2011-06-18 (×5): qty 2

## 2011-06-18 MED ORDER — AMLODIPINE BESYLATE 10 MG PO TABS
10.0000 mg | ORAL_TABLET | Freq: Every morning | ORAL | Status: DC
  Administered 2011-06-19 – 2011-06-22 (×4): 10 mg via ORAL
  Filled 2011-06-18 (×4): qty 1

## 2011-06-18 MED ORDER — SODIUM CHLORIDE 0.9 % IJ SOLN: 3.0000 mL | INTRAMUSCULAR | Status: DC | PRN

## 2011-06-18 MED ORDER — CHOLECALCIFEROL 50 MCG (2000 UT) PO CAPS
1.0000 | ORAL_CAPSULE | Freq: Two times a day (BID) | ORAL | Status: DC
Start: 2011-06-18 — End: 2011-06-18

## 2011-06-18 NOTE — H&P (Signed)
PCP:   Katy Apo, MD, MD   Chief Complaint:  Leg swelling  HPI: Patient is an 78 year old African American male past medical history of lumbar stenosis, hypertension bladder spasms and mild dementia who is able to ambulate some but spends significant amount of time in bed or in a chair. He is cared for by his wife and other caregivers plus family. Over the past one to 2 weeks, the patient's wife had noted that the patient is having significant swelling of the left lower extremity from the upper thigh down to the foot. He also had some occasional episodes of leg pain. It is hard to quantify the leg pain as anterior or posterior. At times the patient does not have much of a good recollection and the patient's wife sometimes answers on behalf of the patient. Regardless he was seen today in the orthopedic clinic and noting that he has significant edema of the left leg only, there was concern about the possibility of a blood clot. Patient was made a direct admission and was transferred to the orthopedic floor.  Review of Systems:  The patient himself is very pleasant and am able to get a review of systems as he answers me directly. Have or has very poor memory. He denies any headaches, vision changes, dysphasia, chest pain, palpitations, shortness of breath, wheeze, cough, abdominal pain, hematuria, dysuria, constipation, diarrhea, focal extremity numbness weakness or pain acutely. For his chronic issues, his wife is able to relate this as he does not have any memory of this the review of systems is otherwise negative.  Past Medical History: Past Medical History  Diagnosis Date  . Generalized weakness   . Hypokalemia   . Renal disorder   . UTI (lower urinary tract infection)   . Cancer     prostate   . Hyperlipidemia   . Vitamin d deficiency   . Bell's palsy   . DJD (degenerative joint disease)   . Lumbar stenosis   . Hypertension   . Depression   . Dementia    Past Surgical History    Procedure Date  . Total knee arthroplasty   . Back surgery     Medications: Prior to Admission medications   Medication Sig Start Date End Date Taking? Authorizing Provider  ALPRAZolam Prudy Feeler) 0.5 MG tablet Take 0.5 mg by mouth 3 (three) times daily as needed. For anxiety.   Yes Historical Provider, MD  aMILoride (MIDAMOR) 5 MG tablet Take 10 mg by mouth 2 (two) times daily.   Yes Historical Provider, MD  amLODipine (NORVASC) 10 MG tablet Take 10 mg by mouth every morning.   Yes Historical Provider, MD  atenolol (TENORMIN) 50 MG tablet Take 50 mg by mouth every morning.   Yes Historical Provider, MD  Calcium-Vitamin D (CALTRATE 600 PLUS-VIT D PO) Take 1 tablet by mouth 2 (two) times daily.   Yes Historical Provider, MD  Cholecalciferol 2000 UNITS CAPS Take 1 capsule by mouth 2 (two) times daily.   Yes Historical Provider, MD  donepezil (ARICEPT) 10 MG tablet Take 10 mg by mouth every morning.   Yes Historical Provider, MD  ferrous sulfate 324 (65 FE) MG TBEC Take 1 tablet by mouth 2 (two) times daily.   Yes Historical Provider, MD  GLUCOSAMINE CHONDROITIN COMPLX PO Take 1 tablet by mouth daily.   Yes Historical Provider, MD  mirabegron ER (MYRBETRIQ) 25 MG TB24 Take 25 mg by mouth daily.   Yes Historical Provider, MD  naproxen sodium (ANAPROX)  220 MG tablet Take 220 mg by mouth daily as needed. For pain.   Yes Historical Provider, MD  OVER THE COUNTER MEDICATION Take 500 mg by mouth 2 (two) times daily. Calcium Citrate.   Yes Historical Provider, MD  oxybutynin (DITROPAN-XL) 5 MG 24 hr tablet Take 5 mg by mouth daily.   Yes Historical Provider, MD  potassium chloride SA (K-DUR,KLOR-CON) 20 MEQ tablet Take 20 mEq by mouth 2 (two) times daily.   Yes Historical Provider, MD  Tamsulosin HCl (FLOMAX) 0.4 MG CAPS Take 0.8 mg by mouth at bedtime.   Yes Historical Provider, MD    Allergies:   Allergies  Allergen Reactions  . Iohexol      Desc: sob, throat swelling (1988 gdc) pt requires full  premeds and does well, JB 8/14/7     Social History:  reports that he has quit smoking. He has never used smokeless tobacco. He reports that he does not drink alcohol or use illicit drugs. The patient is normally at baseline able to some limited mobility with use of a walker because of his lumbar stenosis. He lives at home with his wife and they have some other caregivers..  Family History: hypertension  Physical Exam: Filed Vitals:   06/18/11 1414 06/18/11 1637  BP: 163/81   Pulse: 66   Temp: 97 F (36.1 C)   Resp: 20   Height:  5\' 7"  (1.702 m)  Weight:  86.183 kg (190 lb)  SpO2: 99%    General: Alert and oriented x2, no acute distress HEENT: Normocephalic and atraumatic, mucous members are dry Cardiovascular: Regular rate and rhythm, S1-S2-soft Lungs: Clear to auscultation bilaterally Abdomen: Soft, nontender, nondistended, positive bowel sounds Extremities: No clubbing or cyanosis, trace pitting edema on the right and 2+ pitting edema from the mid thigh down to the foot on the left.   Labs on Admission:   [facility] 06/18/11 1649  NA 144  K 3.3*  CL 102  CO2 29  GLUCOSE 146*  BUN 14  CREATININE 1.05  CALCIUM 9.8  MG --  PHOS --    Basename 06/18/11 1649  WBC 5.0  NEUTROABS --  HGB 10.6*  HCT 32.9*  MCV 92.2  PLT 210      Assessment/Plan Present on Admission:  .Lumbar stenosis: Chronic. Patient had an unsuccessful surgery years ago. He is able to and we will walk her. Once her sure of his clot status can make plans. Accordingly  .Leg edema, left: Certainly concerning for the possibility of a DVT given his prolonged immobilization issues. Awaiting lotion with Doppler.  .Dementia: Continue Aricept.  Marland KitchenHTN (hypertension): Slowly increase. Restart patient's home medications.  .Bladder, atonic: Continue bladder medications.  Hypokalemia: Replace   After discussion with the patient's wife, he is to be a full code.  We will respect these wishes.  I  anticipate his length of stay to be 1 day if his Dopplers negative, and 2-3 if it is positive based on plan of course, unless something should change.  Time spent on this patient including examination and decision-making process: 40 minutes.  Hollice Espy [phone] 06/18/2011, 7:30 PM

## 2011-06-19 DIAGNOSIS — R609 Edema, unspecified: Secondary | ICD-10-CM

## 2011-06-19 MED ORDER — COUMADIN BOOK
Freq: Once | Status: DC
  Filled 2011-06-19: qty 1

## 2011-06-19 MED ORDER — ENOXAPARIN SODIUM 100 MG/ML ~~LOC~~ SOLN
1.0000 mg/kg | Freq: Two times a day (BID) | SUBCUTANEOUS | Status: DC
  Administered 2011-06-19 – 2011-06-22 (×7): 85 mg via SUBCUTANEOUS
  Filled 2011-06-19 (×8): qty 1

## 2011-06-19 MED ORDER — WARFARIN SODIUM 10 MG PO TABS
10.0000 mg | ORAL_TABLET | Freq: Once | ORAL | Status: AC
  Administered 2011-06-19: 10 mg via ORAL
  Filled 2011-06-19: qty 1

## 2011-06-19 MED ORDER — WARFARIN VIDEO: Freq: Once | Status: DC

## 2011-06-19 MED ORDER — WARFARIN - PHYSICIAN DOSING INPATIENT: Freq: Every day | Status: DC

## 2011-06-19 NOTE — Progress Notes (Signed)
Subjective: Patient without any complaints. No chest pain no shortness of breath no leg pain. He does have left leg swelling. According to wife has been present 2-3 days. She states he has been compliant with his diuretics.  Objective: Vital signs in last 24 hours: Temp:  [97 F (36.1 C)-97.5 F (36.4 C)] 97.5 F (36.4 C) (06/07 4098) Pulse Rate:  [66-84] 84  (06/07 0614) Resp:  [18-20] 20  (06/07 0614) BP: (157-163)/(81-98) 157/87 mmHg (06/07 0614) SpO2:  [98 %-100 %] 98 % (06/07 0614) Weight:  [86.183 kg (190 lb)] 86.183 kg (190 lb) (06/06 1637) Weight change:  Last BM Date: 06/16/11  Intake/Output from previous day:   Intake/Output this shift:    General appearance: alert and cooperative Resp: clear to auscultation bilaterally Cardio: regular rate and rhythm, S1, S2 normal, no murmur, click, rub or gallop Extremities: 2-3+ left lower extremity edema, no calf pain, no posterior popliteal cord appreciated  Lab Results:  Results for orders placed during the hospital encounter of 06/18/11 (from the past 24 hour(s))  CBC     Status: Abnormal   Collection Time   06/18/11  4:49 PM      Component Value Range   WBC 5.0  4.0 - 10.5 (K/uL)   RBC 3.57 (*) 4.22 - 5.81 (MIL/uL)   Hemoglobin 10.6 (*) 13.0 - 17.0 (g/dL)   HCT 11.9 (*) 14.7 - 52.0 (%)   MCV 92.2  78.0 - 100.0 (fL)   MCH 29.7  26.0 - 34.0 (pg)   MCHC 32.2  30.0 - 36.0 (g/dL)   RDW 82.9  56.2 - 13.0 (%)   Platelets 210  150 - 400 (K/uL)  BASIC METABOLIC PANEL     Status: Abnormal   Collection Time   06/18/11  4:49 PM      Component Value Range   Sodium 144  135 - 145 (mEq/L)   Potassium 3.3 (*) 3.5 - 5.1 (mEq/L)   Chloride 102  96 - 112 (mEq/L)   CO2 29  19 - 32 (mEq/L)   Glucose, Bld 146 (*) 70 - 99 (mg/dL)   BUN 14  6 - 23 (mg/dL)   Creatinine, Ser 8.65  0.50 - 1.35 (mg/dL)   Calcium 9.8  8.4 - 78.4 (mg/dL)   GFR calc non Af Amer 63 (*) >90 (mL/min)   GFR calc Af Amer 73 (*) >90 (mL/min)       Studies/Results: No results found.  Medications:  Prior to Admission:  Prescriptions prior to admission  Medication Sig Dispense Refill  . ALPRAZolam (XANAX) 0.5 MG tablet Take 0.5 mg by mouth 3 (three) times daily as needed. For anxiety.      Marland Kitchen aMILoride (MIDAMOR) 5 MG tablet Take 10 mg by mouth 2 (two) times daily.      Marland Kitchen amLODipine (NORVASC) 10 MG tablet Take 10 mg by mouth every morning.      Marland Kitchen atenolol (TENORMIN) 50 MG tablet Take 50 mg by mouth every morning.      . Calcium-Vitamin D (CALTRATE 600 PLUS-VIT D PO) Take 1 tablet by mouth 2 (two) times daily.      . Cholecalciferol 2000 UNITS CAPS Take 1 capsule by mouth 2 (two) times daily.      Marland Kitchen donepezil (ARICEPT) 10 MG tablet Take 10 mg by mouth every morning.      . ferrous sulfate 324 (65 FE) MG TBEC Take 1 tablet by mouth 2 (two) times daily.      Marland Kitchen GLUCOSAMINE  CHONDROITIN COMPLX PO Take 1 tablet by mouth daily.      . mirabegron ER (MYRBETRIQ) 25 MG TB24 Take 25 mg by mouth daily.      . naproxen sodium (ANAPROX) 220 MG tablet Take 220 mg by mouth daily as needed. For pain.      Marland Kitchen OVER THE COUNTER MEDICATION Take 500 mg by mouth 2 (two) times daily. Calcium Citrate.      Marland Kitchen oxybutynin (DITROPAN-XL) 5 MG 24 hr tablet Take 5 mg by mouth daily.      . potassium chloride SA (K-DUR,KLOR-CON) 20 MEQ tablet Take 20 mEq by mouth 2 (two) times daily.      . Tamsulosin HCl (FLOMAX) 0.4 MG CAPS Take 0.8 mg by mouth at bedtime.       Scheduled:   . aMILoride  10 mg Oral BID  . amLODipine  10 mg Oral q morning - 10a  . atenolol  50 mg Oral q morning - 10a  . cholecalciferol  2,000 Units Oral BID WC  . donepezil  10 mg Oral q morning - 10a  . enoxaparin  40 mg Subcutaneous Q24H  . ferrous sulfate  325 mg Oral BID WC  . mirabegron ER  25 mg Oral Daily  . oxybutynin  5 mg Oral Daily  . potassium chloride SA  20 mEq Oral BID  . sodium chloride  3 mL Intravenous Q12H  . Tamsulosin HCl  0.8 mg Oral QHS  . DISCONTD:  Cholecalciferol  1 capsule Oral BID  . DISCONTD: ferrous sulfate  1 tablet Oral BID   Continuous:   Assessment/Plan: Principal Problem:  *Leg edema, left Active Problems:  Lumbar stenosis  Dementia  HTN (hypertension)  Bladder, atonic  Venous Doppler ultrasound has not been performed, patient on DVT prophylaxis it ultrasound confirmed DVT full dose anticoagulation will be started. If negative stable for discharge.  LOS: 1 day   Pier Bosher D 06/19/2011, 8:22 AM

## 2011-06-19 NOTE — Progress Notes (Signed)
Bilateral lower extremity venous duplex completed.  Preliminary report is positive for DVT in the right common femoral, left common femoral, femoral, profunda, popliteal, and posterior tibial veins.  There is appears to be SVT in the left greater saphenous vein.

## 2011-06-19 NOTE — Plan of Care (Signed)
Problem: Phase I Progression Outcomes Goal: Voiding-avoid urinary catheter unless indicated Outcome: Not Met (add Reason) Pt has atonic bladder, condom cath in place and draining

## 2011-06-20 DIAGNOSIS — K5900 Constipation, unspecified: Secondary | ICD-10-CM | POA: Diagnosis present

## 2011-06-20 DIAGNOSIS — Z86718 Personal history of other venous thrombosis and embolism: Secondary | ICD-10-CM | POA: Diagnosis present

## 2011-06-20 DIAGNOSIS — E876 Hypokalemia: Secondary | ICD-10-CM | POA: Diagnosis present

## 2011-06-20 LAB — PROTIME-INR: Prothrombin Time: 16.3 seconds — ABNORMAL HIGH (ref 11.6–15.2)

## 2011-06-20 MED ORDER — WARFARIN SODIUM 10 MG PO TABS
10.0000 mg | ORAL_TABLET | Freq: Once | ORAL | Status: AC
  Administered 2011-06-20: 10 mg via ORAL
  Filled 2011-06-20: qty 1

## 2011-06-20 MED ORDER — POLYETHYLENE GLYCOL 3350 17 G PO PACK
17.0000 g | PACK | Freq: Every day | ORAL | Status: DC
  Administered 2011-06-20: 17 g via ORAL
  Filled 2011-06-20 (×3): qty 1

## 2011-06-20 NOTE — Progress Notes (Signed)
Subjective: Mr. Joshua Rodgers is a very pleasant 78 year old male who has a left lower extremity DVT diagnosed yesterday by venous Doppler.  Coumadin has been initiated and INR is not therapeutic yet.  His left lower extremity feels better.  He does not have chest pain or shortness of breath.  He is not having fevers or chills.  He does complain of the need to have a bowel movement.  Normally moves his bowels well but has not had a bowel movement since admission  Objective: Weight change:   Intake/Output Summary (Last 24 hours) at 06/20/11 1300 Last data filed at 06/20/11 0547  Gross per 24 hour  Intake      0 ml  Output   7153 ml  Net  -7153 ml    Filed Vitals:   06/19/11 0614 06/19/11 1300 06/19/11 2122 06/20/11 0547  BP: 157/87 139/76 138/81 122/67  Pulse: 84 82 80 75  Temp: 97.5 F (36.4 C) 99.1 F (37.3 C) 97.8 F (36.6 C) 96.7 F (35.9 C)  TempSrc: Oral     Resp: 20 18 18 20   Height:      Weight:      SpO2: 98% 100% 97% 97%    General appearance: alert and cooperative  Resp: clear to auscultation bilaterally  Cardio: regular rate and rhythm, S1, S2 normal, no murmur, click, rub or gallop  Extremities: 2-3+ left lower extremity edema, no calf pain, no posterior popliteal cord appreciated   Lab Results:  Basename 06/18/11 1649  NA 144  K 3.3*  CL 102  CO2 29  GLUCOSE 146*  BUN 14  CREATININE 1.05  CALCIUM 9.8  MG --  PHOS --   No results found for this basename: AST:2,ALT:2,ALKPHOS:2,BILITOT:2,PROT:2,ALBUMIN:2 in the last 72 hours No results found for this basename: LIPASE:2,AMYLASE:2 in the last 72 hours  Basename 06/18/11 1649  WBC 5.0  NEUTROABS --  HGB 10.6*  HCT 32.9*  MCV 92.2  PLT 210   No results found for this basename: CKTOTAL:3,CKMB:3,CKMBINDEX:3,TROPONINI:3 in the last 72 hours No components found with this basename: POCBNP:3 No results found for this basename: DDIMER:2 in the last 72 hours No results found for this basename: HGBA1C:2 in the  last 72 hours No results found for this basename: CHOL:2,HDL:2,LDLCALC:2,TRIG:2,CHOLHDL:2,LDLDIRECT:2 in the last 72 hours No results found for this basename: TSH,T4TOTAL,FREET3,T3FREE,THYROIDAB in the last 72 hours No results found for this basename: VITAMINB12:2,FOLATE:2,FERRITIN:2,TIBC:2,IRON:2,RETICCTPCT:2 in the last 72 hours  Studies/Results: No results found. Medications: Scheduled Meds:   . aMILoride  10 mg Oral BID  . amLODipine  10 mg Oral q morning - 10a  . atenolol  50 mg Oral q morning - 10a  . cholecalciferol  2,000 Units Oral BID WC  . coumadin book   Does not apply Once  . donepezil  10 mg Oral q morning - 10a  . enoxaparin (LOVENOX) injection  1 mg/kg Subcutaneous Q12H  . ferrous sulfate  325 mg Oral BID WC  . mirabegron ER  25 mg Oral Daily  . oxybutynin  5 mg Oral Daily  . polyethylene glycol  17 g Oral Daily  . potassium chloride SA  20 mEq Oral BID  . sodium chloride  3 mL Intravenous Q12H  . Tamsulosin HCl  0.8 mg Oral QHS  . warfarin  10 mg Oral ONCE-1800  . warfarin  10 mg Oral ONCE-1800  . warfarin   Does not apply Once  . Warfarin - Physician Dosing Inpatient   Does not apply [phone]  Continuous Infusions:  PRN Meds:.sodium chloride, ALPRAZolam, naproxen, sodium chloride  Assessment/Plan: Patient Active Problem List  Diagnoses Date Noted  . Deep venous thrombosis of lower extremity - left lower extremity.  Acute.  Now on therapeutic Lovenox at 1 milligram per kilogram every 12 hours and Coumadin.  10 milligrams of Coumadin yesterday and today.  Check INR in a.m.  06/20/2011  . Constipation - patient uses a bedpan and we'll add MiraLAX.  He is essentially confined to the bed  06/20/2011  . Lumbar stenosis 06/18/2011  . Leg edema, left - secondary to DVT  06/18/2011  . Dementia 06/18/2011  . HTN (hypertension) 06/18/2011  . Bladder, atonic 06/18/2011     LOS: 2 days   Roxann Vierra NEVILL 06/20/2011, 1:00 PM

## 2011-06-21 LAB — PROTIME-INR: INR: 1.58 — ABNORMAL HIGH (ref 0.00–1.49)

## 2011-06-21 MED ORDER — WARFARIN SODIUM 10 MG PO TABS
10.0000 mg | ORAL_TABLET | Freq: Once | ORAL | Status: AC
  Administered 2011-06-21: 10 mg via ORAL
  Filled 2011-06-21: qty 1

## 2011-06-21 NOTE — Progress Notes (Signed)
At 0511 this morning, patient's wife came and said that she thinks that he will allow me to give him his Lovenox.  I went to the patient's room and he was very polite and cooperative.  I gave the Lovenox at that time.

## 2011-06-21 NOTE — Progress Notes (Signed)
Subjective: Mr. Huster is feeling better overall.  Left lower extremity swelling slightly better.  Had some mild confusion last night and tried to refuse his Lovenox dose.  Very jovial today and his wife and daughter are present.  Once to go home soon but is not therapeutic on INR and leg is just beginning to become less edematous  Objective: Weight change:   Intake/Output Summary (Last 24 hours) at 06/21/11 1211 Last data filed at 06/21/11 0830  Gross per 24 hour  Intake    240 ml  Output    700 ml  Net   -460 ml    Filed Vitals:   06/19/11 2122 06/20/11 0547 06/20/11 1354 06/21/11 0727  BP: 138/81 122/67 127/67 174/89  Pulse: 80 75 75 91  Temp:  96.7 F (35.9 C) 97.8 F (36.6 C) 98.2 F (36.8 C)  TempSrc:   Oral   Resp: 18 20 20 18   Height:      Weight:      SpO2: 97% 97% 98% 95%    General appearance: alert and cooperative, not confused  Resp: clear to auscultation bilaterally  Cardio: regular rate and rhythm, S1, S2 normal, no murmur, click, rub or gallop  Extremities: 2-3+ left lower extremity edema, no calf pain, no posterior popliteal cord appreciated, appears slightly less swollen than yesterday   Lab Results:  Basename 06/18/11 1649  NA 144  K 3.3*  CL 102  CO2 29  GLUCOSE 146*  BUN 14  CREATININE 1.05  CALCIUM 9.8  MG --  PHOS --   No results found for this basename: AST:2,ALT:2,ALKPHOS:2,BILITOT:2,PROT:2,ALBUMIN:2 in the last 72 hours No results found for this basename: LIPASE:2,AMYLASE:2 in the last 72 hours  Basename 06/18/11 1649  WBC 5.0  NEUTROABS --  HGB 10.6*  HCT 32.9*  MCV 92.2  PLT 210   No results found for this basename: CKTOTAL:3,CKMB:3,CKMBINDEX:3,TROPONINI:3 in the last 72 hours No components found with this basename: POCBNP:3 No results found for this basename: DDIMER:2 in the last 72 hours No results found for this basename: HGBA1C:2 in the last 72 hours No results found for this basename:  CHOL:2,HDL:2,LDLCALC:2,TRIG:2,CHOLHDL:2,LDLDIRECT:2 in the last 72 hours No results found for this basename: TSH,T4TOTAL,FREET3,T3FREE,THYROIDAB in the last 72 hours No results found for this basename: VITAMINB12:2,FOLATE:2,FERRITIN:2,TIBC:2,IRON:2,RETICCTPCT:2 in the last 72 hours  Studies/Results: No results found. Medications: Scheduled Meds:   . aMILoride  10 mg Oral BID  . amLODipine  10 mg Oral q morning - 10a  . atenolol  50 mg Oral q morning - 10a  . cholecalciferol  2,000 Units Oral BID WC  . coumadin book   Does not apply Once  . donepezil  10 mg Oral q morning - 10a  . enoxaparin (LOVENOX) injection  1 mg/kg Subcutaneous Q12H  . ferrous sulfate  325 mg Oral BID WC  . mirabegron ER  25 mg Oral Daily  . oxybutynin  5 mg Oral Daily  . polyethylene glycol  17 g Oral Daily  . potassium chloride SA  20 mEq Oral BID  . sodium chloride  3 mL Intravenous Q12H  . Tamsulosin HCl  0.8 mg Oral QHS  . warfarin  10 mg Oral ONCE-1800  . warfarin   Does not apply Once  . Warfarin - Physician Dosing Inpatient   Does not apply q1800   Continuous Infusions:  PRN Meds:.sodium chloride, ALPRAZolam, naproxen, sodium chloride  Assessment/Plan:  Patient Active Problem List  Diagnoses  Date Noted  .  Deep venous thrombosis  of lower extremity - left lower extremity. Acute. Now on therapeutic Lovenox at 1 milligram per kilogram every 12 hours and Coumadin. 10 milligrams of Coumadin daily for now.  INR not performed yet today.  Will make sure it is performed and base dose of Coumadin based on that. Check INR in a.m. as well 06/20/2011  .  Constipation - patient uses a bedpan and we'll add MiraLAX. He is essentially confined to the bed  06/20/2011  .  Lumbar stenosis - able to ambulate short distances.  Usually uses bedpan for bowel movements.  Consult PT 06/18/2011  .  Leg edema, left - secondary to DVT  06/18/2011  .  Dementia - mild.  Some mild sundowning last night.  Hopefully will  not worsen tonight.  We'll prefer not to use sedatives if at all possible 06/18/2011  .  HTN (hypertension)  - blood pressure controlled 06/18/2011  .  Bladder, atonic - aware 06/18/2011      LOS: 3 days   Joshua Rodgers 06/21/2011, 12:11 PM

## 2011-06-21 NOTE — Progress Notes (Signed)
At the beginning of the evening shift, Joshua Rodgers was very cooperative, alert and oriented.  As the evening progressed, patient became confused, agitated, and uncooperative.  He did not want to take his 10 pm medications but his wife assisted in speaking with him and then he agreed.  Then he refused to have his vitals taken by the tech.  He had a 12 am Lovenox injection and he refused.  His wife and myself spoke with him for around 15-20 minutes and he still refused.  "They keep coming in here disturbing me while I'm trying to sleep.  Just leave me alone".   I did not give him the Lovenox.

## 2011-06-22 LAB — PROTIME-INR: INR: 2.19 — ABNORMAL HIGH (ref 0.00–1.49)

## 2011-06-22 MED ORDER — ENOXAPARIN SODIUM 150 MG/ML ~~LOC~~ SOLN: 1.0000 mg/kg | Freq: Two times a day (BID) | SUBCUTANEOUS | Status: DC

## 2011-06-22 MED ORDER — WARFARIN SODIUM 5 MG PO TABS: 5.0000 mg | ORAL_TABLET | Freq: Every day | ORAL | Status: DC

## 2011-06-22 NOTE — Progress Notes (Signed)
CARE MANAGEMENT NOTE 06/22/2011 Action/Plan:   Spoke with patient and wife on 06/19/11, she states she has a Programmer, systems that assists her with her husband, not interested in SNF and doesnt feel need for further assistance. Will follow.

## 2011-06-22 NOTE — Progress Notes (Signed)
Physical Therapy Evaluation Note  Past Medical History  Diagnosis Date  . Generalized weakness   . Hypokalemia   . Renal disorder   . UTI (lower urinary tract infection)   . Cancer     prostate   . Hyperlipidemia   . Vitamin d deficiency   . Bell's palsy   . DJD (degenerative joint disease)   . Lumbar stenosis   . Hypertension   . Depression   . Dementia     Past Surgical History  Procedure Date  . Total knee arthroplasty   . Back surgery      06/22/11 1144  PT Visit Information  Last PT Received On 06/22/11  Assistance Needed +2  PT Time Calculation  PT Start Time 1144  PT Stop Time 1216  PT Time Calculation (min) 32 min  Subjective Data  Subjective Pt received sitting up in bed with wife feeding patient lunch.  Patient Stated Goal Get out of hospital  Precautions  Precautions Fall  Restrictions  Weight Bearing Restrictions No  Home Living  Lives With Spouse  Available Help at Discharge Family (hired help 3x/wk for an hour at a time)  Type of Home House  Home Access Ramped entrance  Home Layout One level  Bathroom Shower/Tub (sponge bath in bed)  Bathroom Toilet (bed pan)  Home Adaptive Equipment Walker - rolling;Bedside commode/3-in-1;Wheelchair - manual (electric w/c)  Prior Function  Level of Independence Needs assistance  Needs Assistance Bathing;Dressing;Feeding;Grooming;Toileting;Meal Prep;Light Housekeeping;Gait;Transfers  Bath Maximal  Dressing Maximal  Feeding Maximal  Grooming Maximal  Toileting Total  Meal Prep Total  Light Housekeeping Total  Driving No  Vocation Retired  Geneticist, molecular No difficulties  Cognition  Overall Cognitive Status Appears within functional limits for tasks assessed/performed  Arousal/Alertness Awake/alert  Orientation Level Oriented X4 / Intact  Behavior During Session [facility] for tasks performed  Right Upper Extremity Assessment  RUE ROM/Strength/Tone (grossly 3+/5)  Left Upper Extremity  Assessment  LUE ROM/Strength/Tone (grossly 3+/5)  Right Lower Extremity Assessment  RLE ROM/Strength/Tone (grossly 3/5 based on bed level exam)  Left Lower Extremity Assessment  LLE ROM/Strength/Tone Deficits  LLE ROM/Strength/Tone Deficits due to edema from DVT, strength 2+/5  Trunk Assessment  Trunk Assessment Kyphotic  Bed Mobility  Bed Mobility Supine to Sit;Sit to Supine  Supine to Sit 2: Max assist;HOB elevated  Sit to Supine 1: +2 Total assist;HOB flat (maxA x2 to scoot to [facility])  Sit to Supine: Patient Percentage 20%  Details for Bed Mobility Assistance pt required assist for both LEs and trunk elevation. Patient became upset when PT dropped bedrail to mimic home set up and became agitated. PT unable to explain that PT assessing how patient does to assist with making d/c recommendations due to RN staff and case management coming in to room and patient and spouse becoming overwhelmed. Because patient/spouse found out he was being d/c'd today they refused/deferred OOB to chair transfer secondary "we do it all the time at home just fine."  Balance  Balance Assessed Yes  Static Sitting Balance  Static Sitting - Balance Support Bilateral upper extremity supported  Static Sitting - Level of Assistance 2: Max assist  Static Sitting - Comment/# of Minutes patient sat EOB x 5 min + strong/positive posterior lean requiring maxA to maintain upright posture.  PT - End of Session  Activity Tolerance Treatment limited secondary to agitation  Patient left in bed;with call bell/phone within reach;with family/visitor present;with nursing in room  Nurse Communication Mobility status  PT Assessment  Clinical Impression Statement Pt with h/o of L LE DVT. Patient functioning near baseline per spouse report. Patient con't to be dependent for all mobilty and ADLs. Patient most likely to benefit from hospital bed however due to patient agitation unable to make formal assessment or discuss with  patient/spouse. pt to received home health PT and will defer hospital bed recommendation to them. Per RN staff patient to be transferred home via ambulance. Patient spouse is patient's primary caregiver and greatly expressed that they have been doing "fine" for a long time.  PT Recommendation/Assessment Patient needs continued PT services  PT Problem List Decreased strength;Decreased range of motion;Decreased activity tolerance;Decreased balance;Decreased mobility  Barriers to Discharge None  PT Therapy Diagnosis  Difficulty walking;Abnormality of gait;Generalized weakness  PT Plan  PT Frequency Min 3X/week  PT Treatment/Interventions Gait training;Functional mobility training;Therapeutic activities;Therapeutic exercise  PT Recommendation  Follow Up Recommendations Home health PT;Supervision/Assistance - 24 hour  Equipment Recommended Hospital bed (defer to home health PT)  Individuals Consulted  Consulted and Agree with Results and Recommendations (unable to discuss due to patient agitation)  Acute Rehab PT Goals  PT Goal Formulation Patient unable to participate in goal setting  Time For Goal Achievement 07/06/11  Potential to Achieve Goals Fair  Pt will go Supine/Side to Sit with rail;with HOB not 0 degrees (comment degree);with min assist  PT Goal: Supine/Side to Sit - Progress Goal set today  Pt will Sit at [facility] of Bed with min assist;3-5 min;with unilateral upper extremity support  PT Goal: Sit at Edge Of Bed - Progress Goal set today  Pt will go Sit to Supine/Side with mod assist;with HOB 0 degrees  PT Goal: Sit to Supine/Side - Progress Goal set today  Pt will Transfer Bed to Chair/Chair to Bed with mod assist  PT Transfer Goal: Bed to Chair/Chair to Bed - Progress Goal set today  PT General Charges  $$ ACUTE PT VISIT 1 Procedure  PT Evaluation  $Initial PT Evaluation Tier II 1 Procedure  Written Expression  Dominant Hand Right    Pain: pt denies pain at this time  Lewis Shock, PT, DPT Pager #: [phone] Office #: [phone]

## 2011-06-22 NOTE — Discharge Instructions (Signed)
Home Health to be provided by [facility] [phone]  *PT/INR to be drawn by RN, call results to Dr.Polite for dosing.

## 2011-06-22 NOTE — Progress Notes (Signed)
Utilization review completed. Purcell Jungbluth, RN, BSN. 06/22/11 

## 2011-06-22 NOTE — Progress Notes (Signed)
CARE MANAGEMENT NOTE 06/22/2011  Patient:  Joshua Rodgers, Joshua Rodgers   Account Number:  [id number]  Date Initiated:  06/22/2011  Documentation initiated by:  Vance Peper  Subjective/Objective Assessment:   78 yr old male admitted for left leg DVT     Action/Plan:   Spoke with patient and wife on 06/19/11, she states she has a Programmer, systems that assists her with her husband, not interested in SNF and doesnt feel need for further assistance. Will follow.   Anticipated DC Date:  06/23/2011   Anticipated DC Plan:  HOME W HOME HEALTH SERVICES      DC Planning Services  CM consult      [facility] Choice  HOME HEALTH   Choice offered to / List presented to:  C-3 Spouse        HH arranged  HH-2 PT  HH-1 RN      [facility] agency  [facility]   Status of service:  Completed, signed off Medicare Important Message given?   (If response is "NO", the following Medicare IM given date fields will be blank) Date Medicare IM given:   Date Additional Medicare IM given:    Discharge Disposition:  HOME W HOME HEALTH SERVICES  Per UR Regulation:    If discussed at Long Length of Stay Meetings, dates discussed:    Comments:  06/22/11 1200 Vance Peper, RN BSN Case Manager Spoke with Patient and spouse.Offered choice for Home Health. Wife states she has used [facility] in the past. States she cannot/willnot be able to do lovenox injections. CM spoke with Debbie-[facility] liasion and they will have the RN do injections for next 3-4 days. Explained this to patient and wife. Spoke with Dr. Nehemiah Settle.

## 2011-06-22 NOTE — Discharge Summary (Signed)
Physician Discharge Summary  Patient ID: Joshua Rodgers MRN: [id number] DOB/AGE: [birth year] 76 y.o.  Admit date: 06/18/2011 Discharge date: 06/22/2011  Admission Diagnoses: Leg edema Discharge Diagnoses:  Principal Problem:  *Leg edema, left Active Problems:  Lumbar stenosis  Dementia  HTN (hypertension)  Bladder, atonic  Deep venous thrombosis of lower extremity  Constipation  Hypokalemia   Discharged Condition: stable  Hospital Course:  Patient was directly admitted to the hospital with acute onset of leg edema after been seen in orthopedic office. Ultrasound inHospital was positive for DVT, patient was started on Lovenox Coumadin, his INR is therapeutic he will require one more day of Lovenox overlap of Coumadin. There've been no hospital complications. He's felt to be stable for discharge Consults:    Significant Diagnostic Studies:Dg Chest 2 View  06/01/2011  *RADIOLOGY REPORT*  Clinical Data: Weakness and near syncope.  CHEST - 2 VIEW  Comparison: 02/12/2010.  Findings: The heart is normal in size and stable.  There is tortuosity, ectasia and calcification of the thoracic aorta. Stable elevation of the left hemidiaphragm.  Mild stable bronchitic changes but no infiltrates, edema or effusions.  The bony thorax is intact.  IMPRESSION: Chronic bronchitic type changes but no acute pulmonary findings.  Original Report Authenticated By: P. Loralie Champagne, M.D.    Venous doppler US  Summary:  - Findings consistent with acute deep vein thrombosis involving the right common femoral vein. - Findings consistent with acute deep vein thrombosis involving the left common femoral, femoral, profunda, popliteal, and posterior tibial veins.Superficial thrombusnoted in the left greater saphenous vein. - No evidence of Baker's cyst on the right or left. Other specific details can be found in the table(s) above. Prepared and Electronically Authenticated by     Discharge Exam: Blood  pressure 136/74, pulse 76, temperature 98 F (36.7 C), temperature source Oral, resp. rate 18, height 5\' 7"  (1.702 m), weight 86.183 kg (190 lb), SpO2 98.00%. General appearance: alert and cooperative Resp: clear to auscultation bilaterally Cardio: regular rate and rhythm, S1, S2 normal, no murmur, click, rub or gallop Extremities: 1+ edema left lower extremity  Disposition: 01-Home or Self Care   Medication List  As of 06/22/2011 11:33 AM   STOP taking these medications         naproxen sodium 220 MG tablet         TAKE these medications         ALPRAZolam 0.5 MG tablet   Commonly known as: XANAX   Take 0.5 mg by mouth 3 (three) times daily as needed. For anxiety.      aMILoride 5 MG tablet   Commonly known as: MIDAMOR   Take 10 mg by mouth 2 (two) times daily.      amLODipine 10 MG tablet   Commonly known as: NORVASC   Take 10 mg by mouth every morning.      atenolol 50 MG tablet   Commonly known as: TENORMIN   Take 50 mg by mouth every morning.      CALTRATE 600 PLUS-VIT D PO   Take 1 tablet by mouth 2 (two) times daily.      Cholecalciferol 2000 UNITS Caps   Take 1 capsule by mouth 2 (two) times daily.      donepezil 10 MG tablet   Commonly known as: ARICEPT   Take 10 mg by mouth every morning.      enoxaparin 150 MG/ML injection   Commonly known as: LOVENOX   Inject 0.57 mLs (  85 mg total) into the skin every 12 (twelve) hours.      ferrous sulfate 324 (65 FE) MG Tbec   Take 1 tablet by mouth 2 (two) times daily.      GLUCOSAMINE CHONDROITIN COMPLX PO   Take 1 tablet by mouth daily.      MYRBETRIQ 25 MG Tb24   Generic drug: mirabegron ER   Take 25 mg by mouth daily.      OVER THE COUNTER MEDICATION   Take 500 mg by mouth 2 (two) times daily. Calcium Citrate.      oxybutynin 5 MG 24 hr tablet   Commonly known as: DITROPAN-XL   Take 5 mg by mouth daily.      potassium chloride SA 20 MEQ tablet   Commonly known as: K-DUR,KLOR-CON   Take 20 mEq by  mouth 2 (two) times daily.      Tamsulosin HCl 0.4 MG Caps   Commonly known as: FLOMAX   Take 0.8 mg by mouth at bedtime.      warfarin 5 MG tablet   Commonly known as: COUMADIN   Take 1 tablet (5 mg total) by mouth daily. 1 and 1/2 daily             Signed: Hilliard Borges D 06/22/2011, 11:33 AM

## 2011-07-02 ENCOUNTER — Encounter (HOSPITAL_COMMUNITY): Payer: Self-pay

## 2011-07-02 ENCOUNTER — Emergency Department (HOSPITAL_COMMUNITY): Payer: Medicare Other

## 2011-07-02 ENCOUNTER — Emergency Department (HOSPITAL_COMMUNITY)
Admission: EM | Admit: 2011-07-02 | Discharge: 2011-07-02 | Disposition: A | Discharge status: Home / Self Care | Payer: Medicare Other | Attending: Emergency Medicine | Admitting: Emergency Medicine

## 2011-07-02 DIAGNOSIS — R5381 Other malaise: Secondary | ICD-10-CM | POA: Insufficient documentation

## 2011-07-02 DIAGNOSIS — E559 Vitamin D deficiency, unspecified: Secondary | ICD-10-CM | POA: Insufficient documentation

## 2011-07-02 DIAGNOSIS — Z87891 Personal history of nicotine dependence: Secondary | ICD-10-CM | POA: Insufficient documentation

## 2011-07-02 DIAGNOSIS — F329 Major depressive disorder, single episode, unspecified: Secondary | ICD-10-CM | POA: Insufficient documentation

## 2011-07-02 DIAGNOSIS — M545 Low back pain, unspecified: Secondary | ICD-10-CM | POA: Insufficient documentation

## 2011-07-02 DIAGNOSIS — F3289 Other specified depressive episodes: Secondary | ICD-10-CM | POA: Insufficient documentation

## 2011-07-02 DIAGNOSIS — N289 Disorder of kidney and ureter, unspecified: Secondary | ICD-10-CM | POA: Insufficient documentation

## 2011-07-02 DIAGNOSIS — I10 Essential (primary) hypertension: Secondary | ICD-10-CM | POA: Insufficient documentation

## 2011-07-02 DIAGNOSIS — Z8546 Personal history of malignant neoplasm of prostate: Secondary | ICD-10-CM | POA: Insufficient documentation

## 2011-07-02 DIAGNOSIS — M549 Dorsalgia, unspecified: Secondary | ICD-10-CM

## 2011-07-02 DIAGNOSIS — F0390 Unspecified dementia without behavioral disturbance: Secondary | ICD-10-CM | POA: Insufficient documentation

## 2011-07-02 DIAGNOSIS — R5383 Other fatigue: Secondary | ICD-10-CM | POA: Insufficient documentation

## 2011-07-02 DIAGNOSIS — Z7901 Long term (current) use of anticoagulants: Secondary | ICD-10-CM | POA: Insufficient documentation

## 2011-07-02 DIAGNOSIS — E785 Hyperlipidemia, unspecified: Secondary | ICD-10-CM | POA: Insufficient documentation

## 2011-07-02 LAB — CBC
HCT: 33.4 % — ABNORMAL LOW (ref 39.0–52.0)
Hemoglobin: 10.7 g/dL — ABNORMAL LOW (ref 13.0–17.0)
MCH: 28.9 pg (ref 26.0–34.0)
MCHC: 32 g/dL (ref 30.0–36.0)
MCV: 90.3 fL (ref 78.0–100.0)
Platelets: 241 10*3/uL (ref 150–400)
RBC: 3.7 MIL/uL — ABNORMAL LOW (ref 4.22–5.81)
RDW: 14.2 % (ref 11.5–15.5)
WBC: 5.6 10*3/uL (ref 4.0–10.5)

## 2011-07-02 LAB — BASIC METABOLIC PANEL WITH GFR
BUN: 14 mg/dL (ref 6–23)
Calcium: 9.8 mg/dL (ref 8.4–10.5)
GFR calc Af Amer: 69 mL/min — ABNORMAL LOW (ref >90)
GFR calc non Af Amer: 60 mL/min — ABNORMAL LOW (ref >90)
Glucose, Bld: 93 mg/dL (ref 70–99)
Sodium: 144 meq/L (ref 135–145)

## 2011-07-02 LAB — URINALYSIS, ROUTINE W REFLEX MICROSCOPIC
Bilirubin Urine: NEGATIVE
Glucose, UA: NEGATIVE mg/dL
Hgb urine dipstick: NEGATIVE
Ketones, ur: NEGATIVE mg/dL
Leukocytes, UA: NEGATIVE
Nitrite: NEGATIVE
Protein, ur: NEGATIVE mg/dL
Specific Gravity, Urine: 1.008 (ref 1.005–1.030)
Urobilinogen, UA: 0.2 mg/dL (ref 0.0–1.0)
pH: 8.5 — ABNORMAL HIGH (ref 5.0–8.0)

## 2011-07-02 LAB — BASIC METABOLIC PANEL
CO2: 32 mEq/L (ref 19–32)
Chloride: 102 mEq/L (ref 96–112)
Creatinine, Ser: 1.09 mg/dL (ref 0.50–1.35)
Potassium: 3.2 mEq/L — ABNORMAL LOW (ref 3.5–5.1)

## 2011-07-02 LAB — PROTIME-INR
INR: 2.67 — ABNORMAL HIGH (ref 0.00–1.49)
Prothrombin Time: 28.9 s — ABNORMAL HIGH (ref 11.6–15.2)

## 2011-07-02 MED ORDER — HYDROCODONE-ACETAMINOPHEN 5-325 MG PO TABS
2.0000 | ORAL_TABLET | Freq: Once | ORAL | Status: AC
  Administered 2011-07-02: 2 via ORAL
  Filled 2011-07-02: qty 2

## 2011-07-02 MED ORDER — HYDROCODONE-ACETAMINOPHEN 5-500 MG PO TABS: 1.0000 | ORAL_TABLET | Freq: Three times a day (TID) | ORAL | Status: AC | PRN

## 2011-07-02 NOTE — ED Provider Notes (Signed)
History     CSN: [id number]  Arrival date & time 07/02/11  5621   First MD Initiated Contact with Patient 07/02/11 [phone]      Chief Complaint  Patient presents with  . Back Pain    (Consider location/radiation/quality/duration/timing/severity/associated sxs/prior treatment) HPI  78 year old male past medical history of spinal stenosis, generalized weakness, hypokalemia, history of urinary tract infection contributing to weakness, degenerative joint disease, dementia, and Bell's palsy presents today with a chief complaint of back pain. No new trauma. It hurts both L and R lower back.  Not over his spine.  No pain in legs. His back pain has  gotten gradually worse over the past 2 or 3 days. He denies any changes in his bowel or bladder habits. He denies any numbness around his crotch or anus.   He also feels a little bit weaker than previously. Finally his wife has a concern about his recent initiation of Coumadin therapy. He's been told by his primary care doctor that his Coumadin has been too high, and he was told several days ago to stop taking his Coumadin. He denies any nausea, vomiting, fever, chills, chest pain, shortness of breath, arm pain, jaw pain, confusion. He causes illness mild. Nothing makes it better or worse. His wife gave him cyclobenzaprine 10 mg this morning. After the pill was administered, he began to slur his words. He has demonstrated word slurring before. This resolved after medication effect wears off.  Past Medical History  Diagnosis Date  . Generalized weakness   . Hypokalemia   . Renal disorder   . UTI (lower urinary tract infection)   . Cancer     prostate   . Hyperlipidemia   . Vitamin d deficiency   . Bell's palsy   . DJD (degenerative joint disease)   . Lumbar stenosis   . Hypertension   . Depression   . Dementia     Past Surgical History  Procedure Date  . Total knee arthroplasty   . Back surgery     No family history on file.  History    Substance Use Topics  . Smoking status: Former Games developer  . Smokeless tobacco: Never Used  . Alcohol Use: No      Review of Systems Constitutional: Negative for fever and chills.  HENT: Negative for ear pain, sore throat and trouble swallowing.   Eyes: Negative for pain and visual disturbance.  Respiratory: Negative for cough and shortness of breath.   Cardiovascular: Negative for chest pain and leg swelling.  Gastrointestinal: Negative for nausea, vomiting, abdominal pain and diarrhea.  Genitourinary: Negative for dysuria, urgency and frequency.  Musculoskeletal: POS  for back pain and positive bilateral lower extremity ankle swelling.  Skin: Negative for rash and wound.  Neurological: Negative for dizziness, syncope, POS speech difficulty chronic when taking mm relaxers), POS weakness (chronic) and neg numbness.   Allergies  Iohexol  Home Medications   Current Outpatient Rx  Name Route Sig Dispense Refill  . ACETAMINOPHEN 500 MG PO TABS Oral Take 1,000 mg by mouth every 6 (six) hours as needed. For pain    . ALPRAZOLAM 0.5 MG PO TABS Oral Take 0.5 mg by mouth 3 (three) times daily as needed. For anxiety.    . AMILORIDE HCL 5 MG PO TABS Oral Take 10 mg by mouth 2 (two) times daily.    Marland Kitchen AMLODIPINE BESYLATE 10 MG PO TABS Oral Take 10 mg by mouth every morning.    . ATENOLOL 50 MG  PO TABS Oral Take 50 mg by mouth every morning.    Marland Kitchen CALTRATE 600 PLUS-VIT D PO Oral Take 1 tablet by mouth 2 (two) times daily.    . CHOLECALCIFEROL 2000 UNITS PO CAPS Oral Take 1 capsule by mouth 2 (two) times daily.    . CYCLOBENZAPRINE HCL 5 MG PO TABS Oral Take 5 mg by mouth 3 (three) times daily as needed. For muscle spasms    . DICLOFENAC SODIUM 1.5 % TD SOLN Transdermal Place onto the skin daily as needed. For pain    . DONEPEZIL HCL 10 MG PO TABS Oral Take 10 mg by mouth every morning.    Marland Kitchen FERROUS SULFATE 324 (65 FE) MG PO TBEC Oral Take 1 tablet by mouth 2 (two) times daily.    Marland Kitchen GLUCOSAMINE  CHONDROITIN COMPLX PO Oral Take 1 tablet by mouth daily.    Marland Kitchen MIRABEGRON ER 25 MG PO TB24 Oral Take 25 mg by mouth daily.    Marland Kitchen OVER THE COUNTER MEDICATION Oral Take 500 mg by mouth 2 (two) times daily. Calcium Citrate.    . OXYBUTYNIN CHLORIDE ER 5 MG PO TB24 Oral Take 5 mg by mouth daily.    Marland Kitchen POTASSIUM CHLORIDE CRYS ER 20 MEQ PO TBCR Oral Take 20 mEq by mouth 2 (two) times daily.    Marland Kitchen TAMSULOSIN HCL 0.4 MG PO CAPS Oral Take 0.8 mg by mouth at bedtime.    Marland Kitchen HYDROCODONE-ACETAMINOPHEN 5-500 MG PO TABS Oral Take 1 tablet by mouth every 8 (eight) hours as needed for pain. 15 tablet 0    BP 177/92  Pulse 92  Temp 97.6 F (36.4 C) (Oral)  Resp 20  SpO2 95%  Physical Exam Consitutional: Pt in no acute distress.   Head: Normocephalic and atraumatic.  Eyes: Extraocular motion intact, no scleral icterus Neck: Supple without meningismus, mass, or overt JVD Respiratory: Effort normal and breath sounds normal. No respiratory distress. CV: Heart regular rate and regular rhythm (sinus), no obvious murmurs.  Pulses +2 and symmetric Abdomen: Soft, non-tender, non-distended. No rebound or guarding.  MSK: Back tender to palpation bilaterally superior to the SI joint. Most of the lower lumbar musculature is tender to palpation. There is no meaningful tenderness to the bony lumbar spine. There is considerable bilateral ankle swelling. These ankles are not tender to palpation.  Extremities otherwise are atraumatic without deformity, ROM intact Skin: Warm, dry, intact Neuro: Alert and oriented, no motor deficit noted.  Mild slurring. PERRL.  EOMI.  Symmetric arm raises bilaterally, no pronator drift. Reflexes diminished BLE at achilles and patella;  no clonus.  Bilateral hip flexors 3/5.  Bilateral plantar flexion ankles 5/5.   bilateral dorsiflexion ankles 5/5.   arm and wrist strength maintained >=4/5.  FTN and RAM normal.  CNs 2-12 normal.   Babinski normal.    Psychiatric: Mood and affect are  normal    ED Course  Procedures (including critical care time)  Labs Reviewed  CBC - Abnormal; Notable for the following:    RBC 3.70 (*)     Hemoglobin 10.7 (*)     HCT 33.4 (*)     All other components within normal limits  BASIC METABOLIC PANEL - Abnormal; Notable for the following:    Potassium 3.2 (*)     GFR calc non Af Amer 60 (*)     GFR calc Af Amer 69 (*)     All other components within normal limits  URINALYSIS, ROUTINE W REFLEX MICROSCOPIC -  Abnormal; Notable for the following:    APPearance CLOUDY (*)     pH 8.5 (*)     All other components within normal limits  PROTIME-INR - Abnormal; Notable for the following:    Prothrombin Time 28.9 (*)     INR 2.67 (*)     All other components within normal limits  URINE CULTURE   Dg Chest 1 View  07/02/2011  *RADIOLOGY REPORT*  Clinical Data: Back pain, hypertension.  CHEST - 1 VIEW  Comparison: 06/01/2011  Findings: Prominent cardiomediastinal contours, similar to prior. Hypoaeration results in interstitial vascular crowding and elevated hemidiaphragms. The apices are obscured by projection artifact/lordotic position. Mild left lung base scarring versus atelectasis.  Otherwise, no focal areas of consolidation.  No pleural effusion or pneumothorax.  No acute osseous finding.  IMPRESSION: Prominent cardiomediastinal contours, similar to prior.  Original Report Authenticated By: Waneta Martins, M.D.     1. Back pain       MDM  Multiple complaints. Chiefly increased back pain. He has no red flags or conus medullaris syndrome.  Nontender spine to hammer percussion. We will check his INR, check her urine, treat his pain. I do not suspect stroke in this patient. He has symmetric strength throughout his body normal cranial nerves, and is mild slurring of speech which all family members endorse she's had before intermittently been taking the pain medication or muscle relaxers. The patient said musclelike as this morning. The red  flags for his skeletal pain either. He does seem to be somewhat weak in his hip flexors but this is chronic for the patient. He gets around by walker slowly. No new trauma reported by any family member.  Rule out urinary tract infection. Check INR. Pain control for back. Did discuss the patient's presentation today with primary care, Dr. Idelle Crouch replacement.  Patient improved after pain control. Workup otherwise negative. Patient discharged home to follow up with primary care, within normal range INR. Given how long it took for the patient's INR to fall, I instructed patient not to take more Coumadin but to talk to his doctor.    PT DC home stable.  Discussed with pt the clinical impression, treatment in the ED, and follow up plan.  We alslo discussed the indications for returning to the ED, which include shortness or breath, confusion, fever, new weakness or numbness, chest pain, or any other concerning symptom.  The pt understood the treatment and plan, is stable, and is able to leave the ED.           Larrie Kass, MD 07/02/11 [phone]

## 2011-07-02 NOTE — ED Notes (Signed)
Per EMS, pt came in today with lower back pain. States has chronic back pain but became increasingly worse last night. Back pain increases with movement. Pain 5/10 in bed, 8/10 with movement. Non-Ambulatory at baseline. Hx of DVT X3 weeks ago, HTN.

## 2011-07-02 NOTE — Discharge Instructions (Signed)
Follow up with your providers as dicussed in the ED today and as written above.  See your doctor immediately--or return to the ED--with any new or troubling symptoms including fevers, weakness, new chest pain, shortness or breath, numbness, or any other concerning symptom.  Back Pain, Adult    Low back pain is very common. About 1 in 5 people have back pain.The cause of low back pain is rarely dangerous. The pain often gets better over time.About half of people with a sudden onset of back pain feel better in just 2 weeks. About 8 in 10 people feel better by 6 weeks.  CAUSES Some common causes of back pain include:  Strain of the muscles or ligaments supporting the spine.   Wear and tear (degeneration) of the spinal discs.   Arthritis.   Direct injury to the back.  DIAGNOSIS Most of the time, the direct cause of low back pain is not known.However, back pain can be treated effectively even when the exact cause of the pain is unknown.Answering your caregiver's questions about your overall health and symptoms is one of the most accurate ways to make sure the cause of your pain is not dangerous. If your caregiver needs more information, he or she may order lab work or imaging tests (X-rays or MRIs).However, even if imaging tests show changes in your back, this usually does not require surgery. HOME CARE INSTRUCTIONS For many people, back pain returns.Since low back pain is rarely dangerous, it is often a condition that people can learn to [facility] their own.   Remain active. It is stressful on the back to sit or stand in one place. Do not sit, drive, or stand in one place for more than 30 minutes at a time. Take short walks on level surfaces as soon as pain allows.Try to increase the length of time you walk each day.   Do not stay in bed.Resting more than 1 or 2 days can delay your recovery.   Do not avoid exercise or work.Your body is made to move.It is not dangerous to be active, even  though your back may hurt.Your back will likely heal faster if you return to being active before your pain is gone.   Pay attention to your body when you bend and lift. Many people have less discomfortwhen lifting if they bend their knees, keep the load close to their bodies,and avoid twisting. Often, the most comfortable positions are those that put less stress on your recovering back.   Find a comfortable position to sleep. Use a firm mattress and lie on your side with your knees slightly bent. If you lie on your back, put a pillow under your knees.   Only take over-the-counter or prescription medicines as directed by your caregiver. Over-the-counter medicines to reduce pain and inflammation are often the most helpful.Your caregiver may prescribe muscle relaxant drugs.These medicines help dull your pain so you can more quickly return to your normal activities and healthy exercise.   Put ice on the injured area.   Put ice in a plastic bag.   Place a towel between your skin and the bag.   Leave the ice on for 15 to 20 minutes, 3 to 4 times a day for the first 2 to 3 days. After that, ice and heat may be alternated to reduce pain and spasms.   Ask your caregiver about trying back exercises and gentle massage. This may be of some benefit.   Avoid feeling anxious or stressed.Stress increases  muscle tension and can worsen back pain.It is important to recognize when you are anxious or stressed and learn ways to manage it.Exercise is a great option.  SEEK MEDICAL CARE IF:  You have pain that is not relieved with rest or medicine.   You have pain that does not improve in 1 week.   You have new symptoms.   You are generally not feeling well.  SEEK IMMEDIATE MEDICAL CARE IF:   You have pain that radiates from your back into your legs.   You develop new bowel or bladder control problems.   You have unusual weakness or numbness in your arms or legs.   You develop nausea or vomiting.    You develop abdominal pain.   You feel faint.  Document Released: 12/29/2004 Document Revised: 12/18/2010 Document Reviewed: 05/19/2010 [facility] Patient Information 2012 [place], [place].

## 2011-07-02 NOTE — ED Notes (Signed)
[facility] called for patient pick up.  Family at bedside.

## 2011-07-03 NOTE — ED Provider Notes (Signed)
I saw and evaluated the patient, reviewed the resident's note and I agree with the findings and plan.   Chronic low back pain.  Family has been giving muscle relaxants only.  Pt is arousable, conversant.  Pt can follow up with PCP Dr. Nehemiah Settle as outpt safely.  INR is therapeutic.    Gavin Pound. Oletta Lamas, MD 07/03/11 2041

## 2011-07-04 LAB — URINE CULTURE
Colony Count: 60000
Culture  Setup Time: 201306201205

## 2011-12-16 ENCOUNTER — Ambulatory Visit: Payer: Self-pay | Admitting: Ophthalmology

## 2011-12-16 LAB — CREATININE, SERUM
Creatinine: 1.49 mg/dL — ABNORMAL HIGH (ref 0.60–1.30)
EGFR (African American): 49 — ABNORMAL LOW
EGFR (Non-African Amer.): 42 — ABNORMAL LOW

## 2012-03-14 ENCOUNTER — Other Ambulatory Visit (HOSPITAL_COMMUNITY): Payer: Self-pay | Admitting: Internal Medicine

## 2012-03-14 ENCOUNTER — Encounter (HOSPITAL_COMMUNITY): Payer: Medicare Other

## 2012-03-14 DIAGNOSIS — M7989 Other specified soft tissue disorders: Secondary | ICD-10-CM

## 2012-03-17 ENCOUNTER — Ambulatory Visit (HOSPITAL_COMMUNITY)
Admission: RE | Admit: 2012-03-17 | Discharge: 2012-03-17 | Disposition: A | Discharge status: Home / Self Care | Payer: Medicare PPO | Source: Ambulatory Visit | Attending: Internal Medicine | Admitting: Internal Medicine

## 2012-03-17 DIAGNOSIS — M7989 Other specified soft tissue disorders: Secondary | ICD-10-CM

## 2012-03-17 DIAGNOSIS — I824Y9 Acute embolism and thrombosis of unspecified deep veins of unspecified proximal lower extremity: Secondary | ICD-10-CM | POA: Insufficient documentation

## 2012-03-17 NOTE — Progress Notes (Addendum)
Right:  DVT noted common femoral vein.  No evidence of superficial thrombosis.  No Baker's cyst.  Left: DVT noted common femoral and femoral veins.  There is superficial thrombosis in the greater saphenous vein.  No Baker's cyst.

## 2012-07-07 ENCOUNTER — Other Ambulatory Visit: Payer: Self-pay | Admitting: Neurology

## 2012-07-23 ENCOUNTER — Other Ambulatory Visit: Payer: Self-pay | Admitting: Neurology

## 2012-08-03 ENCOUNTER — Telehealth: Payer: Self-pay | Admitting: Neurology

## 2012-08-03 MED ORDER — DONEPEZIL HCL 10 MG PO TABS: 10.0000 mg | ORAL_TABLET | Freq: Every day | ORAL | Status: DC

## 2012-08-03 NOTE — Telephone Encounter (Signed)
Rx sent 

## 2012-09-22 ENCOUNTER — Other Ambulatory Visit: Payer: Self-pay | Admitting: Internal Medicine

## 2012-09-22 DIAGNOSIS — R52 Pain, unspecified: Secondary | ICD-10-CM

## 2012-09-29 ENCOUNTER — Other Ambulatory Visit: Payer: Medicare Other

## 2012-10-04 ENCOUNTER — Ambulatory Visit
Admission: RE | Admit: 2012-10-04 | Discharge: 2012-10-04 | Disposition: A | Discharge status: Home / Self Care | Payer: Medicare PPO | Source: Ambulatory Visit | Attending: Internal Medicine | Admitting: Internal Medicine

## 2012-10-04 DIAGNOSIS — R52 Pain, unspecified: Secondary | ICD-10-CM

## 2012-10-06 ENCOUNTER — Telehealth: Payer: Self-pay | Admitting: Oncology

## 2012-10-06 NOTE — Telephone Encounter (Signed)
S/W PT WIFE AND GVE NP APPT 10/02 @ 1:30 W/SHADAD REFERRING DR. RON POLITE DX- PERSISTENT DVT WELCOME PACKET MAILED

## 2012-10-13 ENCOUNTER — Other Ambulatory Visit (HOSPITAL_BASED_OUTPATIENT_CLINIC_OR_DEPARTMENT_OTHER): Payer: Medicare PPO | Admitting: Lab

## 2012-10-13 ENCOUNTER — Ambulatory Visit: Payer: BC Managed Care – PPO

## 2012-10-13 ENCOUNTER — Ambulatory Visit (HOSPITAL_BASED_OUTPATIENT_CLINIC_OR_DEPARTMENT_OTHER): Payer: Medicare PPO | Admitting: Oncology

## 2012-10-13 VITALS — BP 126/68 | HR 70 | Temp 97.5°F | Resp 18

## 2012-10-13 DIAGNOSIS — I82409 Acute embolism and thrombosis of unspecified deep veins of unspecified lower extremity: Secondary | ICD-10-CM

## 2012-10-13 DIAGNOSIS — M7989 Other specified soft tissue disorders: Secondary | ICD-10-CM

## 2012-10-13 DIAGNOSIS — M79609 Pain in unspecified limb: Secondary | ICD-10-CM

## 2012-10-13 NOTE — Consult Note (Signed)
Reason for Referral: History of DVT.   HPI: This is a pleasant 79 year old gentleman referred to me for his history of DVT. He initially presented with left leg swelling in June of 2013. He was hospitalized between June 6 and June 10 of 2013 for a left leg DVT. At that time, he did have a findings consistent with acute deep vein thrombosis involving the right common femoral vein as well as the left common femoral, popliteal and posterior tibial vein. He was started on Coumadin after that hospitalization and have been on it since that time. His risk factors at that time was a knee operation that was done in 2012 as well as completely lack of mobility. He is chair bound for the most and utilizing a motorized scooter to move about and his house. He is not able to stand or ambulate at this time. In the last year and 3 months his lower extremity swelling have actually improved and a followup lower extremity Doppler done on September of 2014 continue to show deep vein thrombosis involving the left common femoral vein and the left saphenous femoral junction. There was no DVT on the right side. There was also clinically noted that his lower for many swelling has improved in both lower extremities. He does report some chronic pain in his left knee but really no other complaints.  Clinically, Joshua Rodgers is very limited quality of life. As mentioned he is chair and bed bound for the most of the day. He is not reporting any seizure or headaches. He does report occasional symptoms of back pain and knee pain but does not report any fevers or chills or sweats. Does not report any appetite changes or weight decline. He is not reporting any nausea or vomiting or abdominal distention. Did not report any changes in his bowel habits. Has not reported any hematuria or dysuria. He does have an indwelling Foley catheter and followed by Dr. Brunilda Payor for that at this time as well as his history of prostate cancer. He is also up to date  with colonoscopy done 2 years ago.   Past Medical History  Diagnosis Date  . Generalized weakness   . Hypokalemia   . Renal disorder   . UTI (lower urinary tract infection)   . Cancer     prostate   . Hyperlipidemia   . Vitamin d deficiency   . Bell's palsy   . DJD (degenerative joint disease)   . Lumbar stenosis   . Hypertension   . Depression   . Dementia   :  Past Surgical History  Procedure Laterality Date  . Total knee arthroplasty    . Back surgery    :  Current Outpatient Prescriptions  Medication Sig Dispense Refill  . acetaminophen (TYLENOL) 500 MG tablet Take 1,000 mg by mouth every 6 (six) hours as needed. For pain      . ALPRAZolam (XANAX) 0.5 MG tablet Take 0.5 mg by mouth 3 (three) times daily as needed. For anxiety.      Marland Kitchen aMILoride (MIDAMOR) 5 MG tablet Take 10 mg by mouth 2 (two) times daily.      Marland Kitchen amLODipine (NORVASC) 10 MG tablet Take 10 mg by mouth every morning.      Marland Kitchen atenolol (TENORMIN) 50 MG tablet Take 50 mg by mouth every morning.      . Calcium-Vitamin D (CALTRATE 600 PLUS-VIT D PO) Take 1 tablet by mouth 2 (two) times daily.      . Cholecalciferol  2000 UNITS CAPS Take 1 capsule by mouth 2 (two) times daily.      . Cyanocobalamin (VITAMIN B 12 PO) Take 1,000 mg by mouth daily.      . cyclobenzaprine (FLEXERIL) 5 MG tablet Take 5 mg by mouth 3 (three) times daily as needed. For muscle spasms      . donepezil (ARICEPT) 10 MG tablet Take 1 tablet (10 mg total) by mouth daily.  30 tablet  3  . ferrous sulfate 324 (65 FE) MG TBEC Take 1 tablet by mouth 2 (two) times daily.      Marland Kitchen GLUCOSAMINE CHONDROITIN COMPLX PO Take 1 tablet by mouth daily.      . mirabegron ER (MYRBETRIQ) 25 MG TB24 Take 25 mg by mouth daily.      . Omega-3 Fat Ac-Cholecalciferol (MINICAPS VITAMIN-D/OMEGA-3 PO) Take 1,000 mg by mouth 2 (two) times daily.      . potassium chloride SA (K-DUR,KLOR-CON) 20 MEQ tablet Take 20 mEq by mouth 2 (two) times daily.      . Tamsulosin HCl  (FLOMAX) 0.4 MG CAPS Take 0.8 mg by mouth at bedtime.      Marland Kitchen warfarin (COUMADIN) 2 MG tablet Take 2 mg by mouth daily.       No current facility-administered medications for this visit.       Allergies  Allergen Reactions  . Iohexol      Desc: sob, throat swelling (1988 gdc) pt requires full premeds and does well, JB 8/14/7   :  No family history on file.:  History   Social History  . Marital Status: Married    Spouse Name: N/A    Number of Children: N/A  . Years of Education: N/A   Occupational History  . Not on file.   Social History Main Topics  . Smoking status: Former Games developer  . Smokeless tobacco: Never Used  . Alcohol Use: No  . Drug Use: No  . Sexual Activity: No   Other Topics Concern  . Not on file   Social History Narrative  . No narrative on file  :  Pertinent items are noted in HPI.  Exam: ECOG 3 Blood pressure 126/68, pulse 70, temperature 97.5 F (36.4 C), temperature source Oral, resp. rate 18. General appearance: alert and cooperative Head: Normocephalic, without obvious abnormality, atraumatic Throat: lips, mucosa, and tongue normal; teeth and gums normal Neck: no adenopathy, no carotid bruit, no JVD, supple, symmetrical, trachea midline and thyroid not enlarged, symmetric, no tenderness/mass/nodules Back: negative Resp: clear to auscultation bilaterally Chest wall: no tenderness Cardio: regular rate and rhythm, S1, S2 normal, no murmur, click, rub or gallop GI: soft, non-tender; bowel sounds normal; no masses,  no organomegaly Extremities: extremities normal, atraumatic, no cyanosis. Trace edema noted bilaterally. Skin: Skin color, texture, turgor normal. No rashes or lesions Lymph nodes: Cervical, supraclavicular, and axillary nodes normal.     US Venous Img Lower Bilateral  10/04/2012   CLINICAL DATA:  Paraplegia.  Leg pain.  EXAM: VENOUS DOPPLER ULTRASOUND OF BILATERAL LOWER EXTREMITIES  TECHNIQUE: Gray-scale sonography with graded  compression, as well as color Doppler and duplex ultrasound, were performed to evaluate the deep venous system from the level of the common femoral vein through the popliteal and proximal calf veins. Spectral Doppler was utilized to evaluate flow at rest and with distal augmentation maneuvers.  COMPARISON:  None.  FINDINGS: Thrombus within deep veins: Present in the left common femoral vein and left saphenous femoral junction, and in the proximal  saphenous vein.  Compressibility of deep veins: Normal except in the regions noted above.  Duplex waveform respiratory phasicity:  Normal.  Duplex waveform response to augmentation:  Normal.  Venous reflux:  None visualized.  IMPRESSION: 1. Deep vein thrombosis involving the left common femoral vein and left saphenous femoral junction. There is also thrombus in the proximal left great saphenous vein. No DVT on the right side noted.   Electronically Signed   By: Herbie Baltimore   On: 10/04/2012 15:25    Assessment and Plan:   79 year old gentleman with the following issues:  1. Bilateral lower extremity deep vein thrombosis diagnosed in June of 2013. This was discovered when the patient clinically noted bilateral lower extremity swelling and lower extremity Doppler confirmed the presence of extensive clots in both legs. After a prolonged anticoagulation for the last year and 3 months his clinical status has improved with improved lower extremity swelling and a followup lower extremity Doppler on 10/04/2012 showed a persistent clot on the left side but none on the right side. The patient is completely immobile and chair bound. I discussed the natural course of deep vein thrombosis in this particular setting as well as his risks generally. I doubt that he has a hypercoagulable state and likely the onset of his blood clot was due to his immobility and possible orthopedic surgery. Or occult cancer is also a possibility but he have been on active surveillance for his  prostate cancer as well as receiving age-appropriate cancer screening. Given the fact that he is clinically improving and of the clot burden is actually have decreased in the last year and 3 months but not completely resolved makes me think that this is probably related to his the venous structure as well as extreme immobility.  My recommendation at this point is not to do any further testing and I see no added value to a hypercoagulable workup. However, I would recommend that he continues on long-term anticoagulation with Coumadin given his extreme risk of developing recurrent deep vein thrombosis and possible fatal pulmonary embolism.  2. Left leg pain: This is probably multifactorial due to his history of deep vein thrombosis and possible arthritis.  All his questions were answered today and I will be happy to see Joshua Rodgers in the future as needed.

## 2012-10-13 NOTE — Progress Notes (Signed)
See consult note

## 2012-11-21 ENCOUNTER — Encounter (INDEPENDENT_AMBULATORY_CARE_PROVIDER_SITE_OTHER): Payer: Self-pay

## 2012-11-21 ENCOUNTER — Encounter: Payer: Self-pay | Admitting: Neurology

## 2012-11-21 ENCOUNTER — Ambulatory Visit (INDEPENDENT_AMBULATORY_CARE_PROVIDER_SITE_OTHER): Payer: 59 | Admitting: Neurology

## 2012-11-21 VITALS — BP 120/70 | HR 64

## 2012-11-21 DIAGNOSIS — R269 Unspecified abnormalities of gait and mobility: Secondary | ICD-10-CM | POA: Insufficient documentation

## 2012-11-21 NOTE — Progress Notes (Signed)
History of Present Illness:   Mr. Battiste,  79 year-old right-handed [place] American male, accompanied by his wife and daughter, follow up for gait difficulty, worsening memory loss. Last clinic visit was in Feb 2013.  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  He is a retired Programmer, systems at [employer], with past medical history of left Bell's palsy, hypertension, presenting with chronic low back pain, increased gait difficulty. I saw him initially in 2010 for gait difficulty.  At that time, he was found to have mild bilateral ankle dorsiflexion weakness. He later, also developed slow progressive memory loss.  He was  evaluated by Dr. Mikal Plane, MRI of lumbar demonstrated moderate to to severe canal stenosis, multilevel degenerative disc disease.  He underwent L4 laminectomy, L3-5 hemilaminectomies and decompression of the spinal canal at L3,4,5 by Dr. Mikal Plane in 09/2008.  His low back pain has much improved, but he continue to have gait difficulty, bending forward, walking slowly.   Repeat MRI Lumbar showed post surgical changes, continued evidence of L4, L5 foraminal stenosis, no canal stenosis.  He continued to have low back pain and gait difficulty post surgically, later, also developed gradual worsening memory trouble    MRI of brain from [facility] system review,alarmingly diffuse atrophy, especially bilateral perisylvian fissure regions,  ventriculomegaly,  mild to moderate periventricular white matter disease.   He is now wheelchair bound for one year, need assistance in transfer, increased the distal leg weakness, he also wearing of external Foley Catheter, more agitation at evening time. Wife hope to improve his ability to take a few steps, to be easily transferred,  Review of Systems  Out of a complete 14 system review, the patient complains of only the following symptoms, and all other reviewed systems are negative.  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 Patient drinks one cup of coffee daily, quit tobacco use 30 years ago, seldomly consumes alcohol and had never used illicet drugs.  He is right handed.   Family History Patients parents are both deceased cause of death not listed age of there death listed 61  Past Medical History High blood pressure, memory loss, multi level disc disease, gait disorder,axonal peripheral neuropathy.  Surgical History L4- L5 laminectomy 09/2008.    Physical Exam  General: well developed, well  nourished, seated, in no evident distress, well groomed Head: head  normocephalic and atraumatic.  Oropharynx benign. Neck: supple  Respiratory: LCA Cardiovascular: regular rate and rhythm, no murmurs  Neurologic Exam  Mental Status: Awake and quiet, depend on family for history, MMSE 13/30, he missed 3/3 recalls, he is not oriented to time and place, could not copy design or write sentence.  Cranial Nerves:  Pupils equal, briskly reactive to light.  Extraocular movements full without nystagmus.  Visual fields full to confrontation.  Hearing intact and symmetric to finger snap.  Facial sensation intact.  He has left upper and lower face weakness. Tongue, palate move normally and symmetrically.  Neck flexion and extension normal. Motor: He has profound bilateral ankle weakness, ankle dorsiflexion 2/5, at left and right.  Coordination: Rapid alternating movements normal in all extremities.  Finger-to-nose performed accurately bilaterally. Gait and Station: deferred. Reflexes: absent bilateral lower extremity deep tendon  reflexes.  Toes downgoing.   Assessment and Plan:   79 years old right-handed [place] American male, with  progressive worsening memory trouble, history of lumbar spinal stenosis, now worsening bilateral lower extremity distal weakness,Gait difficulty.  1.  continue Aricept, Namenda 2.  ankle brace 3 home physical therapy 4 return to clinic in 6 months with Eber Jones.     Marland Kitchen

## 2012-11-24 ENCOUNTER — Emergency Department (HOSPITAL_COMMUNITY): Payer: Medicare PPO

## 2012-11-24 ENCOUNTER — Encounter (HOSPITAL_COMMUNITY): Payer: Self-pay | Admitting: Emergency Medicine

## 2012-11-24 ENCOUNTER — Inpatient Hospital Stay (HOSPITAL_COMMUNITY)
Admission: EM | Admit: 2012-11-24 | Discharge: 2012-11-26 | DRG: 689 | Disposition: A | Discharge status: Home Health | Payer: Medicare PPO | Attending: Internal Medicine | Admitting: Internal Medicine

## 2012-11-24 DIAGNOSIS — Z7901 Long term (current) use of anticoagulants: Secondary | ICD-10-CM

## 2012-11-24 DIAGNOSIS — F3289 Other specified depressive episodes: Secondary | ICD-10-CM | POA: Diagnosis present

## 2012-11-24 DIAGNOSIS — Z993 Dependence on wheelchair: Secondary | ICD-10-CM

## 2012-11-24 DIAGNOSIS — B961 Klebsiella pneumoniae [K. pneumoniae] as the cause of diseases classified elsewhere: Secondary | ICD-10-CM | POA: Diagnosis present

## 2012-11-24 DIAGNOSIS — G510 Bell's palsy: Secondary | ICD-10-CM | POA: Diagnosis present

## 2012-11-24 DIAGNOSIS — E785 Hyperlipidemia, unspecified: Secondary | ICD-10-CM | POA: Diagnosis present

## 2012-11-24 DIAGNOSIS — M1990 Unspecified osteoarthritis, unspecified site: Secondary | ICD-10-CM | POA: Diagnosis present

## 2012-11-24 DIAGNOSIS — I82409 Acute embolism and thrombosis of unspecified deep veins of unspecified lower extremity: Secondary | ICD-10-CM

## 2012-11-24 DIAGNOSIS — M48061 Spinal stenosis, lumbar region without neurogenic claudication: Secondary | ICD-10-CM

## 2012-11-24 DIAGNOSIS — F329 Major depressive disorder, single episode, unspecified: Secondary | ICD-10-CM | POA: Diagnosis present

## 2012-11-24 DIAGNOSIS — Z96659 Presence of unspecified artificial knee joint: Secondary | ICD-10-CM

## 2012-11-24 DIAGNOSIS — Z8546 Personal history of malignant neoplasm of prostate: Secondary | ICD-10-CM

## 2012-11-24 DIAGNOSIS — R4182 Altered mental status, unspecified: Secondary | ICD-10-CM

## 2012-11-24 DIAGNOSIS — Z87891 Personal history of nicotine dependence: Secondary | ICD-10-CM

## 2012-11-24 DIAGNOSIS — N390 Urinary tract infection, site not specified: Principal | ICD-10-CM | POA: Diagnosis present

## 2012-11-24 DIAGNOSIS — G929 Unspecified toxic encephalopathy: Secondary | ICD-10-CM | POA: Diagnosis present

## 2012-11-24 DIAGNOSIS — E559 Vitamin D deficiency, unspecified: Secondary | ICD-10-CM | POA: Diagnosis present

## 2012-11-24 DIAGNOSIS — Z86718 Personal history of other venous thrombosis and embolism: Secondary | ICD-10-CM

## 2012-11-24 DIAGNOSIS — R269 Unspecified abnormalities of gait and mobility: Secondary | ICD-10-CM | POA: Diagnosis present

## 2012-11-24 DIAGNOSIS — G92 Toxic encephalopathy: Secondary | ICD-10-CM | POA: Diagnosis present

## 2012-11-24 DIAGNOSIS — E876 Hypokalemia: Secondary | ICD-10-CM | POA: Diagnosis present

## 2012-11-24 DIAGNOSIS — I10 Essential (primary) hypertension: Secondary | ICD-10-CM | POA: Diagnosis present

## 2012-11-24 DIAGNOSIS — F0390 Unspecified dementia without behavioral disturbance: Secondary | ICD-10-CM | POA: Diagnosis present

## 2012-11-24 DIAGNOSIS — Z79899 Other long term (current) drug therapy: Secondary | ICD-10-CM

## 2012-11-24 HISTORY — DX: Acute embolism and thrombosis of unspecified deep veins of unspecified lower extremity: I82.409

## 2012-11-24 HISTORY — DX: Unspecified asthma, uncomplicated: J45.909

## 2012-11-24 HISTORY — DX: Urinary tract infection, site not specified: N39.0

## 2012-11-24 HISTORY — DX: Personal history of peptic ulcer disease: Z87.11

## 2012-11-24 HISTORY — DX: Malignant neoplasm of prostate: C61

## 2012-11-24 HISTORY — DX: Anxiety disorder, unspecified: F41.9

## 2012-11-24 HISTORY — DX: Unspecified osteoarthritis, unspecified site: M19.90

## 2012-11-24 LAB — CBC WITH DIFFERENTIAL/PLATELET
Basophils Relative: 0 % (ref 0–1)
Eosinophils Absolute: 0 10*3/uL (ref 0.0–0.7)
Eosinophils Relative: 0 % (ref 0–5)
Lymphocytes Relative: 12 % (ref 12–46)
Lymphs Abs: 0.9 10*3/uL (ref 0.7–4.0)
MCH: 30.3 pg (ref 26.0–34.0)
MCHC: 32.9 g/dL (ref 30.0–36.0)
MCV: 92.3 fL (ref 78.0–100.0)
Monocytes Relative: 12 % (ref 3–12)
Neutro Abs: 5.5 10*3/uL (ref 1.7–7.7)
Neutrophils Relative %: 76 % (ref 43–77)
Platelets: 239 10*3/uL (ref 150–400)
RDW: 14.9 % (ref 11.5–15.5)
WBC: 7.2 10*3/uL (ref 4.0–10.5)

## 2012-11-24 LAB — URINALYSIS, ROUTINE W REFLEX MICROSCOPIC
Nitrite: NEGATIVE
Specific Gravity, Urine: >1.03 — ABNORMAL HIGH (ref 1.005–1.030)
Urobilinogen, UA: 0.2 mg/dL (ref 0.0–1.0)
pH: 7 (ref 5.0–8.0)

## 2012-11-24 LAB — TROPONIN I: Troponin I: <0.3 ng/mL (ref <0.30)

## 2012-11-24 LAB — PHOSPHORUS: Phosphorus: 3 mg/dL (ref 2.3–4.6)

## 2012-11-24 LAB — AMMONIA: Ammonia: 19 umol/L (ref 11–60)

## 2012-11-24 LAB — CBC
HCT: 34.6 % — ABNORMAL LOW (ref 39.0–52.0)
MCH: 29.9 pg (ref 26.0–34.0)
MCHC: 32.4 g/dL (ref 30.0–36.0)
MCV: 92.5 fL (ref 78.0–100.0)
Platelets: 233 10*3/uL (ref 150–400)
RDW: 15.1 % (ref 11.5–15.5)
WBC: 5.8 10*3/uL (ref 4.0–10.5)

## 2012-11-24 LAB — PROTIME-INR: Prothrombin Time: 19.4 seconds — ABNORMAL HIGH (ref 11.6–15.2)

## 2012-11-24 LAB — COMPREHENSIVE METABOLIC PANEL
ALT: 10 U/L (ref 0–53)
Albumin: 3.9 g/dL (ref 3.5–5.2)
BUN: 19 mg/dL (ref 6–23)
Calcium: 9.8 mg/dL (ref 8.4–10.5)
Chloride: 102 mEq/L (ref 96–112)
GFR calc Af Amer: 58 mL/min — ABNORMAL LOW (ref >90)
GFR calc non Af Amer: 50 mL/min — ABNORMAL LOW (ref >90)
Glucose, Bld: 100 mg/dL — ABNORMAL HIGH (ref 70–99)
Sodium: 141 mEq/L (ref 135–145)
Total Protein: 7.5 g/dL (ref 6.0–8.3)

## 2012-11-24 LAB — CREATININE, SERUM: GFR calc Af Amer: 63 mL/min — ABNORMAL LOW (ref >90)

## 2012-11-24 LAB — URINE MICROSCOPIC-ADD ON

## 2012-11-24 MED ORDER — ONDANSETRON HCL 4 MG PO TABS: 4.0000 mg | ORAL_TABLET | Freq: Four times a day (QID) | ORAL | Status: DC | PRN

## 2012-11-24 MED ORDER — TAMSULOSIN HCL 0.4 MG PO CAPS
0.8000 mg | ORAL_CAPSULE | Freq: Every day | ORAL | Status: DC
  Administered 2012-11-24 – 2012-11-25 (×2): 0.8 mg via ORAL
  Filled 2012-11-24 (×3): qty 2

## 2012-11-24 MED ORDER — SODIUM CHLORIDE 0.9 % IJ SOLN: 3.0000 mL | INTRAMUSCULAR | Status: DC | PRN

## 2012-11-24 MED ORDER — WARFARIN SODIUM 3 MG PO TABS
3.0000 mg | ORAL_TABLET | Freq: Once | ORAL | Status: AC
  Administered 2012-11-24: 3 mg via ORAL
  Filled 2012-11-24 (×2): qty 1

## 2012-11-24 MED ORDER — MIRABEGRON ER 25 MG PO TB24
25.0000 mg | ORAL_TABLET | Freq: Every day | ORAL | Status: DC
  Administered 2012-11-24 – 2012-11-26 (×3): 25 mg via ORAL
  Filled 2012-11-24 (×3): qty 1

## 2012-11-24 MED ORDER — DEXTROSE 5 % IV SOLN
1.0000 g | Freq: Once | INTRAVENOUS | Status: AC
  Administered 2012-11-24: 1 g via INTRAVENOUS
  Filled 2012-11-24: qty 10

## 2012-11-24 MED ORDER — SODIUM CHLORIDE 0.9 % IJ SOLN
3.0000 mL | Freq: Two times a day (BID) | INTRAMUSCULAR | Status: DC
  Administered 2012-11-24 – 2012-11-25 (×2): 3 mL via INTRAVENOUS

## 2012-11-24 MED ORDER — ONDANSETRON HCL 4 MG/2ML IJ SOLN: 4.0000 mg | Freq: Four times a day (QID) | INTRAMUSCULAR | Status: DC | PRN

## 2012-11-24 MED ORDER — ATENOLOL 50 MG PO TABS
50.0000 mg | ORAL_TABLET | Freq: Every morning | ORAL | Status: DC
  Administered 2012-11-25 – 2012-11-26 (×2): 50 mg via ORAL
  Filled 2012-11-24 (×2): qty 1

## 2012-11-24 MED ORDER — WARFARIN - PHARMACIST DOSING INPATIENT
Freq: Every day | Status: DC
  Administered 2012-11-24: 18:00:00

## 2012-11-24 MED ORDER — DONEPEZIL HCL 10 MG PO TABS
10.0000 mg | ORAL_TABLET | Freq: Every day | ORAL | Status: DC
  Administered 2012-11-24 – 2012-11-26 (×3): 10 mg via ORAL
  Filled 2012-11-24 (×3): qty 1

## 2012-11-24 MED ORDER — CYCLOBENZAPRINE HCL 10 MG PO TABS: 5.0000 mg | ORAL_TABLET | Freq: Three times a day (TID) | ORAL | Status: DC | PRN

## 2012-11-24 MED ORDER — KCL IN DEXTROSE-NACL 20-5-0.45 MEQ/L-%-% IV SOLN
INTRAVENOUS | Status: DC
  Administered 2012-11-24 – 2012-11-25 (×2): via INTRAVENOUS
  Filled 2012-11-24 (×5): qty 1000

## 2012-11-24 MED ORDER — SODIUM CHLORIDE 0.9 % IV SOLN: 250.0000 mL | INTRAVENOUS | Status: DC | PRN

## 2012-11-24 MED ORDER — ACETAMINOPHEN 500 MG PO TABS
1000.0000 mg | ORAL_TABLET | Freq: Once | ORAL | Status: AC
  Administered 2012-11-24: 20:00:00 1000 mg via ORAL
  Filled 2012-11-24: qty 2

## 2012-11-24 MED ORDER — AMLODIPINE BESYLATE 10 MG PO TABS
10.0000 mg | ORAL_TABLET | Freq: Every morning | ORAL | Status: DC
  Administered 2012-11-25 – 2012-11-26 (×2): 10 mg via ORAL
  Filled 2012-11-24 (×2): qty 1

## 2012-11-24 MED ORDER — DEXTROSE 5 % IV SOLN
1.0000 g | INTRAVENOUS | Status: DC
  Administered 2012-11-25: 1 g via INTRAVENOUS
  Filled 2012-11-24 (×2): qty 10

## 2012-11-24 MED ORDER — CITALOPRAM HYDROBROMIDE 20 MG PO TABS
20.0000 mg | ORAL_TABLET | Freq: Every day | ORAL | Status: DC
  Administered 2012-11-24 – 2012-11-26 (×3): 20 mg via ORAL
  Filled 2012-11-24 (×3): qty 1

## 2012-11-24 MED ORDER — HEPARIN SODIUM (PORCINE) 5000 UNIT/ML IJ SOLN
5000.0000 [IU] | Freq: Three times a day (TID) | INTRAMUSCULAR | Status: DC
  Filled 2012-11-24 (×2): qty 1

## 2012-11-24 NOTE — H&P (Addendum)
[facility] History and Physical  JEANPAUL BIEHL QMV:[id number] DOB: 03-Jan-1924 DOA: 11/24/2012  Referring physician: Dr. Criss Alvine PCP: Katy Apo, MD  Specialists: Dr. Levert Feinstein  Chief Complaint: Altered mental status and increased somnolence  HPI: Joshua Rodgers is a 79 y.o. male  With history of chronic lumbosacral radiculopathies, moderate-to-severe canal stenosis status post L3-L5 laminectomies and decompression by Dr. Mikal Plane in September 2010 has been wheelchair bound for a year with recent evaluation by neurologist Dr. Debarah Crape in 11/21/2012.  Presents today with 2 day complaint of the above-mentioned symptoms the problem has been persistent and getting worse since onset. Nothing the wife is aware of makes the problem better or worse. She states that yesterday he was sleeping more than usual and has been doing less activity than usual.  In the ED patient was found to have a UTI. CT of head was negative. And we were consulted for management of toxic encephalopathy.  Review of Systems:  10 point review of systems reviewed and negative unless otherwise mentioned above  Past Medical History  Diagnosis Date  . Generalized weakness   . Hypokalemia   . Renal disorder   . UTI (lower urinary tract infection)   . Cancer     prostate   . Hyperlipidemia   . Vitamin D deficiency   . Bell's palsy   . DJD (degenerative joint disease)   . Lumbar stenosis   . Hypertension   . Depression   . Dementia    Past Surgical History  Procedure Laterality Date  . Total knee arthroplasty    . Back surgery     Social History:  reports that he has quit smoking. He has never used smokeless tobacco. He reports that he does not drink alcohol or use illicit drugs.  where does patient live--home, ALF, SNF? and with whom if at home? Patient lives with wife  Can patient participate in ADLs? Not currently  Allergies  Allergen Reactions  . Iohexol      Desc: sob, throat swelling (1988 gdc) pt  requires full premeds and does well, JB 8/14/7     History reviewed. No pertinent family history.   Prior to Admission medications   Medication Sig Start Date End Date Taking? Authorizing Provider  acetaminophen (TYLENOL) 500 MG tablet Take 1,000 mg by mouth once. For pain   Yes Historical Provider, MD  ALPRAZolam Prudy Feeler) 0.5 MG tablet Take 0.5 mg by mouth 3 (three) times daily as needed. For anxiety.   Yes Historical Provider, MD  aMILoride (MIDAMOR) 5 MG tablet Take 10 mg by mouth 2 (two) times daily.   Yes Historical Provider, MD  amLODipine (NORVASC) 10 MG tablet Take 10 mg by mouth every morning.   Yes Historical Provider, MD  atenolol (TENORMIN) 50 MG tablet Take 50 mg by mouth every morning.   Yes Historical Provider, MD  Calcium-Vitamin D (CALTRATE 600 PLUS-VIT D PO) Take 1 tablet by mouth 2 (two) times daily.   Yes Historical Provider, MD  Cholecalciferol 2000 UNITS CAPS Take 1 capsule by mouth 2 (two) times daily.   Yes Historical Provider, MD  citalopram (CELEXA) 20 MG tablet Take 20 mg by mouth daily. 11/11/12  Yes Historical Provider, MD  Cyanocobalamin (VITAMIN B 12 PO) Take 1,000 mg by mouth daily.   Yes Historical Provider, MD  cyclobenzaprine (FLEXERIL) 5 MG tablet Take 5 mg by mouth 3 (three) times daily as needed for muscle spasms. For muscle spasms   Yes Historical Provider, MD  donepezil (ARICEPT) 10 MG tablet Take 10 mg by mouth daily. 08/03/12  Yes Levert Feinstein, MD  ferrous sulfate 324 (65 FE) MG TBEC Take 1 tablet by mouth 2 (two) times daily.   Yes Historical Provider, MD  mirabegron ER (MYRBETRIQ) 25 MG TB24 Take 25 mg by mouth daily.   Yes Historical Provider, MD  potassium chloride SA (K-DUR,KLOR-CON) 20 MEQ tablet Take 20 mEq by mouth 2 (two) times daily.   Yes Historical Provider, MD  Tamsulosin HCl (FLOMAX) 0.4 MG CAPS Take 0.8 mg by mouth at bedtime.   Yes Historical Provider, MD  warfarin (COUMADIN) 2 MG tablet Take 2 mg by mouth daily.   Yes Historical Provider,  MD   Physical Exam: Filed Vitals:   11/24/12 1315  BP: 172/102  Pulse: 95  Temp:   Resp: 17     General:  Pt in NAD, Alert and awake  Eyes: EOMI, non icteric  ENT: normal exterior appearance, dry mucous membranes  Neck: supple, no goiter  Cardiovascular: RRR, no MRG  Respiratory: CTA BL, no increased work of breathing  Abdomen: soft, NT, ND  Skin: warm and dry  Musculoskeletal: no clubbing  Psychiatric: unable to properly assess due to limited patient cooperation  Neurologic: Patient arrousable   Labs on Admission:  Basic Metabolic Panel:  Recent Labs Lab 11/24/12 1015  NA 141  K 3.7  CL 102  CO2 29  GLUCOSE 100*  BUN 19  CREATININE 1.25  CALCIUM 9.8   Liver Function Tests:  Recent Labs Lab 11/24/12 1015  AST 23  ALT 10  ALKPHOS 52  BILITOT 0.3  PROT 7.5  ALBUMIN 3.9   No results found for this basename: LIPASE, AMYLASE,  in the last 168 hours  Recent Labs Lab 11/24/12 1057  AMMONIA 19   CBC:  Recent Labs Lab 11/24/12 1015  WBC 7.2  NEUTROABS 5.5  HGB 11.4*  HCT 34.7*  MCV 92.3  PLT 239   Cardiac Enzymes:  Recent Labs Lab 11/24/12 1015  TROPONINI <0.30    BNP (last 3 results) No results found for this basename: PROBNP,  in the last 8760 hours CBG:  Recent Labs Lab 11/24/12 1012  GLUCAP 97    Radiological Exams on Admission: Ct Head Wo Contrast  11/24/2012   CLINICAL DATA:  Weakness  EXAM: CT HEAD WITHOUT CONTRAST  TECHNIQUE: Contiguous axial images were obtained from the base of the skull through the vertex without intravenous contrast.  COMPARISON:  CT 02/27/2010  FINDINGS: Advanced atrophy. Ventricular enlargement is stable and felt to be related to atrophy.  Chronic microvascular ischemic change in the white matter. Negative for acute infarct, hemorrhage, or mass lesion.  IMPRESSION: Advanced atrophy. Chronic microvascular ischemic change in the white matter.  No acute abnormality and no change from 2012.    Electronically Signed   By: Marlan Palau M.D.   On: 11/24/2012 11:11   Dg Chest Portable 1 View  11/24/2012   CLINICAL DATA:  79 year old male with weakness and shortness of Breath. Initial encounter.  EXAM: PORTABLE CHEST - 1 VIEW  COMPARISON:  03/14/2012 and earlier.  FINDINGS: Portable AP view at 1034 hrs. Mildly larger lung volumes and improved ventilation at the left lung base. Stable cardiac size and mediastinal contours. Visualized tracheal air column is within normal limits. No pneumothorax, pulmonary edema, pleural effusion, or acute pulmonary opacity.  IMPRESSION: No acute cardiopulmonary abnormality.   Electronically Signed   By: Augusto Gamble M.D.   On: 11/24/2012  11:02    EKG: Independently reviewed. Normal sinus rhythm with no ST elevation or depression  Assessment/Plan Active Problems:    1. Toxic encephalopathy - Most likely due to urinary tract infection given you u/a results and negative chest x-ray - At this point plan on giving IV fluids and supportive therapy as well as antibiotics to help clear the infection. - Other etiologies may be polypharmacy as such will have some of the sedating medications on patient's home meds  2 UTI most likely contributing to #1 - Followup with Urine cultures - Rocephin pharmacy to dose  3 dementia - Will continue home regimen  4. htn - Will continue amlodipine and beta blocker given elevated blood pressure   Addendum 5. H/o DVT - Will continue warfarin, pharmacy to dose and d/c heparin VTE prophylaxis.   Code Status: full  code  Family Communication: Discussed with wife at bedside  Disposition Plan: pending improvement in mental status back to baseline  Time spent:  more than one hour   Penny Pia [facility] Pager [phone]  If 7PM-7AM, please contact night-coverage [email or web address] Password [email or web address] 11/24/2012, 2:50 PM

## 2012-11-24 NOTE — ED Provider Notes (Signed)
CSN: [id number]     Arrival date & time 11/24/12  1000 History   First MD Initiated Contact with Patient 11/24/12 1008     Chief Complaint  Patient presents with  . Weakness   (Consider location/radiation/quality/duration/timing/severity/associated sxs/prior Treatment) HPI Comments: 79 year old male presents by EMS with his wife for acute on chronic weakness. History is difficult to obtain from the patient is to dementia and possibly altered mental status. The wife states that he was playing of being cold last night and had that he turned out. He is also been progressively weak worse since last night. They've been barely able to get him up into a chair. He has chronic weakness and cannot walk on his own and is to have assistance but this is worse than normal. He has chronic facial droop and the wife thinks that he might be worse today. She knows all her symptoms after he woke up this morning. He has not had any fevers, cough, dyspnea, or urinary symptoms. He constantly wears a condom catheter because he is always incontinent of urine.   Past Medical History  Diagnosis Date  . Generalized weakness   . Hypokalemia   . Renal disorder   . UTI (lower urinary tract infection)   . Cancer     prostate   . Hyperlipidemia   . Vitamin D deficiency   . Bell's palsy   . DJD (degenerative joint disease)   . Lumbar stenosis   . Hypertension   . Depression   . Dementia    Past Surgical History  Procedure Laterality Date  . Total knee arthroplasty    . Back surgery     History reviewed. No pertinent family history. History  Substance Use Topics  . Smoking status: Former Games developer  . Smokeless tobacco: Never Used  . Alcohol Use: No    Review of Systems  Unable to perform ROS: Dementia    Allergies  Iohexol  Home Medications   Current Outpatient Rx  Name  Route  Sig  Dispense  Refill  . acetaminophen (TYLENOL) 500 MG tablet   Oral   Take 1,000 mg by mouth every 6 (six) hours as  needed. For pain         . ALPRAZolam (XANAX) 0.5 MG tablet   Oral   Take 0.5 mg by mouth 3 (three) times daily as needed. For anxiety.         Marland Kitchen aMILoride (MIDAMOR) 5 MG tablet   Oral   Take 10 mg by mouth 2 (two) times daily.         Marland Kitchen amLODipine (NORVASC) 10 MG tablet   Oral   Take 10 mg by mouth every morning.         Marland Kitchen atenolol (TENORMIN) 50 MG tablet   Oral   Take 50 mg by mouth every morning.         . Calcium-Vitamin D (CALTRATE 600 PLUS-VIT D PO)   Oral   Take 1 tablet by mouth 2 (two) times daily.         . Cholecalciferol 2000 UNITS CAPS   Oral   Take 1 capsule by mouth 2 (two) times daily.         . citalopram (CELEXA) 20 MG tablet   Oral   Take 20 mg by mouth daily.         . Cyanocobalamin (VITAMIN B 12 PO)   Oral   Take 1,000 mg by mouth daily.         Marland Kitchen  cyclobenzaprine (FLEXERIL) 5 MG tablet   Oral   Take 5 mg by mouth 3 (three) times daily as needed. For muscle spasms         . donepezil (ARICEPT) 10 MG tablet   Oral   Take 1 tablet (10 mg total) by mouth daily.   30 tablet   3   . ferrous sulfate 324 (65 FE) MG TBEC   Oral   Take 1 tablet by mouth 2 (two) times daily.         Marland Kitchen GLUCOSAMINE CHONDROITIN COMPLX PO   Oral   Take 1 tablet by mouth daily.         . mirabegron ER (MYRBETRIQ) 25 MG TB24   Oral   Take 25 mg by mouth daily.         . Omega-3 Fat Ac-Cholecalciferol (MINICAPS VITAMIN-D/OMEGA-3 PO)   Oral   Take 1,000 mg by mouth 2 (two) times daily.         . potassium chloride SA (K-DUR,KLOR-CON) 20 MEQ tablet   Oral   Take 20 mEq by mouth 2 (two) times daily.         . Tamsulosin HCl (FLOMAX) 0.4 MG CAPS   Oral   Take 0.8 mg by mouth at bedtime.         Marland Kitchen warfarin (COUMADIN) 2 MG tablet   Oral   Take 2 mg by mouth daily.          BP 178/102  Temp(Src) 97.7 F (36.5 C) (Oral)  Resp 24  SpO2 96% Physical Exam  Nursing note and vitals reviewed. Constitutional: He appears  well-developed and well-nourished.  HENT:  Head: Normocephalic and atraumatic.  Right Ear: External ear normal.  Left Ear: External ear normal.  Nose: Nose normal.  Eyes: Pupils are equal, round, and reactive to light. Right eye exhibits no discharge. Left eye exhibits no discharge.  Neck: Neck supple.  Cardiovascular: Normal rate, regular rhythm, normal heart sounds and intact distal pulses.   Pulmonary/Chest: Effort normal and breath sounds normal. He has no wheezes. He has no rales.  Abdominal: Soft. There is no tenderness.  Genitourinary:  Condom catheter in place  Musculoskeletal: He exhibits no edema.  Neurological: He is alert. He is disoriented. GCS eye subscore is 4. GCS verbal subscore is 4. GCS motor subscore is 6.  Grossly normal strength. Mild right sided facial droop (old per wife)  Skin: Skin is warm and dry.    ED Course  Procedures (including critical care time) Labs Review Labs Reviewed  CBC WITH DIFFERENTIAL - Abnormal; Notable for the following:    RBC 3.76 (*)    Hemoglobin 11.4 (*)    HCT 34.7 (*)    All other components within normal limits  COMPREHENSIVE METABOLIC PANEL - Abnormal; Notable for the following:    Glucose, Bld 100 (*)    GFR calc non Af Amer 50 (*)    GFR calc Af Amer 58 (*)    All other components within normal limits  URINALYSIS, ROUTINE W REFLEX MICROSCOPIC - Abnormal; Notable for the following:    APPearance TURBID (*)    Specific Gravity, Urine >1.030 (*)    Hgb urine dipstick LARGE (*)    Protein, ur 100 (*)    Leukocytes, UA MODERATE (*)    All other components within normal limits  PROTIME-INR - Abnormal; Notable for the following:    Prothrombin Time 19.4 (*)    INR 1.69 (*)  All other components within normal limits  URINE MICROSCOPIC-ADD ON - Abnormal; Notable for the following:    Squamous Epithelial / LPF MANY (*)    Bacteria, UA MANY (*)    All other components within normal limits  URINE CULTURE  GLUCOSE,  CAPILLARY  AMMONIA  TROPONIN I  CG4 I-STAT (LACTIC ACID)   Imaging Review Ct Head Wo Contrast  11/24/2012   CLINICAL DATA:  Weakness  EXAM: CT HEAD WITHOUT CONTRAST  TECHNIQUE: Contiguous axial images were obtained from the base of the skull through the vertex without intravenous contrast.  COMPARISON:  CT 02/27/2010  FINDINGS: Advanced atrophy. Ventricular enlargement is stable and felt to be related to atrophy.  Chronic microvascular ischemic change in the white matter. Negative for acute infarct, hemorrhage, or mass lesion.  IMPRESSION: Advanced atrophy. Chronic microvascular ischemic change in the white matter.  No acute abnormality and no change from 2012.   Electronically Signed   By: Marlan Palau M.D.   On: 11/24/2012 11:11   Dg Chest Portable 1 View  11/24/2012   CLINICAL DATA:  79 year old male with weakness and shortness of Breath. Initial encounter.  EXAM: PORTABLE CHEST - 1 VIEW  COMPARISON:  03/14/2012 and earlier.  FINDINGS: Portable AP view at 1034 hrs. Mildly larger lung volumes and improved ventilation at the left lung base. Stable cardiac size and mediastinal contours. Visualized tracheal air column is within normal limits. No pneumothorax, pulmonary edema, pleural effusion, or acute pulmonary opacity.  IMPRESSION: No acute cardiopulmonary abnormality.   Electronically Signed   By: Augusto Gamble M.D.   On: 11/24/2012 11:02    EKG Interpretation     Ventricular Rate:  89 PR Interval:    QRS Duration: 69 QT Interval:  375 QTC Calculation: 456 R Axis:   0 Text Interpretation:  Normal sinus rhythm Posterior infarct, old No significant change since last tracing            MDM   1. UTI (urinary tract infection)   2. Altered mental status    No focal weakness on exam or new slurred speech/facial droop to suggest a new CVA. Given his history of chills and a dirty urine w/ him chronically with a condom cath will treat for UTI. This is likely the cause of his change in  mental status and diffuse weakness.     Audree Camel, MD 11/24/12 [phone]

## 2012-11-24 NOTE — Progress Notes (Signed)
ANTIBIOTIC CONSULT NOTE - INITIAL  Pharmacy Consult for ceftriaxone and warfarin Indication: UTI, history of DVT  Allergies  Allergen Reactions  . Iohexol      Desc: sob, throat swelling (1988 gdc) pt requires full premeds and does well, JB 8/14/7     Patient Measurements:     Vital Signs: Temp: 98.3 F (36.8 C) (11/13 1718) Temp src: Axillary (11/13 1718) BP: 174/101 mmHg (11/13 1718) Pulse Rate: 84 (11/13 1718) Intake/Output from previous day:   Intake/Output from this shift: Total I/O In: -  Out: 400 [Urine:400]  Labs:  Recent Labs  11/24/12 1015  WBC 7.2  HGB 11.4*  PLT 239  CREATININE 1.25   INR 1.69  The CrCl is unknown because both a height and weight (above a minimum accepted value) are required for this calculation. No results found for this basename: VANCOTROUGH, VANCOPEAK, VANCORANDOM, GENTTROUGH, GENTPEAK, GENTRANDOM, TOBRATROUGH, TOBRAPEAK, TOBRARND, AMIKACINPEAK, AMIKACINTROU, AMIKACIN,  in the last 72 hours   Microbiology: No results found for this or any previous visit (from the past 720 hour(s)).  Medical History: Past Medical History  Diagnosis Date  . Generalized weakness   . Hypokalemia   . Renal disorder   . UTI (lower urinary tract infection)   . Cancer     prostate   . Hyperlipidemia   . Vitamin D deficiency   . Bell's palsy   . DJD (degenerative joint disease)   . Lumbar stenosis   . Hypertension   . Depression   . Dementia     Medications:  Prescriptions prior to admission  Medication Sig Dispense Refill  . acetaminophen (TYLENOL) 500 MG tablet Take 1,000 mg by mouth once. For pain      . ALPRAZolam (XANAX) 0.5 MG tablet Take 0.5 mg by mouth 3 (three) times daily as needed. For anxiety.      Marland Kitchen aMILoride (MIDAMOR) 5 MG tablet Take 10 mg by mouth 2 (two) times daily.      Marland Kitchen amLODipine (NORVASC) 10 MG tablet Take 10 mg by mouth every morning.      Marland Kitchen atenolol (TENORMIN) 50 MG tablet Take 50 mg by mouth every morning.       . Calcium-Vitamin D (CALTRATE 600 PLUS-VIT D PO) Take 1 tablet by mouth 2 (two) times daily.      . Cholecalciferol 2000 UNITS CAPS Take 1 capsule by mouth 2 (two) times daily.      . citalopram (CELEXA) 20 MG tablet Take 20 mg by mouth daily.      . Cyanocobalamin (VITAMIN B 12 PO) Take 1,000 mg by mouth daily.      . cyclobenzaprine (FLEXERIL) 5 MG tablet Take 5 mg by mouth 3 (three) times daily as needed for muscle spasms. For muscle spasms      . donepezil (ARICEPT) 10 MG tablet Take 10 mg by mouth daily.      . ferrous sulfate 324 (65 FE) MG TBEC Take 1 tablet by mouth 2 (two) times daily.      . mirabegron ER (MYRBETRIQ) 25 MG TB24 Take 25 mg by mouth daily.      . potassium chloride SA (K-DUR,KLOR-CON) 20 MEQ tablet Take 20 mEq by mouth 2 (two) times daily.      . Tamsulosin HCl (FLOMAX) 0.4 MG CAPS Take 0.8 mg by mouth at bedtime.      Marland Kitchen warfarin (COUMADIN) 2 MG tablet Take 2 mg by mouth daily.       Assessment: 79 year old  man admitted with altered mental status to start on ceftriaxone for UTI.  The first dose was given in the ED today.    Goal of Therapy:  Treat infection;  INR 2-3  Plan:  Ceftriaxone 1g IV q24.  Follow urine culture. Warfarin 3mg  po x 1 dose today. Daily protimes  Mickeal Skinner 11/24/2012,5:38 PM

## 2012-11-24 NOTE — ED Notes (Signed)
Wife-- Windell Moulding -- cell [phone] Daughter -- Sunny Schlein  --- [phone]  PLEASE notify wife of pt's bed assignment.

## 2012-11-24 NOTE — Progress Notes (Signed)
Pt admitted to the unit at 1730. Pt mental status is confused, A&O x1. Pt oriented to room, staff, and call bell. Skin is dry and intact. Full assessment charted in [facility]. Call bell within reach. Visitor guidelines reviewed w/ pt and/or family.

## 2012-11-24 NOTE — ED Notes (Signed)
Wife given coffee and graham crackers.

## 2012-11-24 NOTE — ED Notes (Signed)
Wife states pt has been increasingly weak, for the past two days, with vomiting last night and decreased appetite. Denies any fever. Has a condom catheter from home.

## 2012-11-24 NOTE — ED Notes (Signed)
CBG 97 

## 2012-11-25 DIAGNOSIS — R269 Unspecified abnormalities of gait and mobility: Secondary | ICD-10-CM

## 2012-11-25 LAB — BASIC METABOLIC PANEL
BUN: 18 mg/dL (ref 6–23)
CO2: 28 mEq/L (ref 19–32)
Calcium: 9 mg/dL (ref 8.4–10.5)
Chloride: 104 mEq/L (ref 96–112)
Creatinine, Ser: 1.41 mg/dL — ABNORMAL HIGH (ref 0.50–1.35)
GFR calc Af Amer: 50 mL/min — ABNORMAL LOW (ref >90)
Potassium: 3.6 mEq/L (ref 3.5–5.1)
Sodium: 143 mEq/L (ref 135–145)

## 2012-11-25 LAB — CBC
HCT: 31.1 % — ABNORMAL LOW (ref 39.0–52.0)
Hemoglobin: 10.1 g/dL — ABNORMAL LOW (ref 13.0–17.0)
MCV: 92.6 fL (ref 78.0–100.0)
RBC: 3.36 MIL/uL — ABNORMAL LOW (ref 4.22–5.81)
RDW: 15.1 % (ref 11.5–15.5)
WBC: 4.5 10*3/uL (ref 4.0–10.5)

## 2012-11-25 MED ORDER — SODIUM CHLORIDE 0.9 % IV SOLN
INTRAVENOUS | Status: DC
  Administered 2012-11-25 – 2012-11-26 (×2): via INTRAVENOUS

## 2012-11-25 MED ORDER — WARFARIN SODIUM 3 MG PO TABS
3.0000 mg | ORAL_TABLET | Freq: Once | ORAL | Status: AC
  Administered 2012-11-25: 18:00:00 3 mg via ORAL
  Filled 2012-11-25: qty 1

## 2012-11-25 NOTE — Progress Notes (Signed)
[facility] PROGRESS NOTE  REUBIN BUSHNELL NFA:[id number] DOB: [birth year] DOA: 11/24/2012 PCP: Katy Apo, MD  HPI/Subjective: Mr. Mancia is an 79 yo male who presents with a PMHx of radiculopathy, lumbar stenosis, DVT, and dementia, with a chief complaint of altered mental status and "not feeling well" per his wife.  He presented to the ER yesterday with malaise, "inability to get out of bed or help himself", and vomiting.  Upon presentation to the ER, a head CT was obtained and found to have no acute changes.  A UA was also obtained and he was found to have a UTI.  He is currently being treated empirically with Rocephin while the urine culture pends.  Today, he is feeling much better, is aware of his surroundings and can hold conversation, and is back to baseline per his wife.  Assessment/Plan:  1. Toxic Encephalopathy - likely secondary to UTI - treating with IV fluids and rocephin - head CT done in ER showed no acute changes - chest xray also negative - could be polypharmacy related, due to sedating meds that pt is on - pt does have underlying dementia, but is back at baseline today per wife.   2. UTI - urine cultures pending - empirically treating with rocephin  3. Dementia -continuing home meds (donepezil)  4. HTN - continue home meds (amlodipine and beta blockers)   5. Hx of DVT - continuing warfarin therapy - monitor INR  6. Generalized weakness -Patient baseline is not great, unable to ambulate without assistance at home. -Patient has history of spinal stenosis, needs assistance with his ADLs. -PT/OT to evaluate and treat.  Code Status:full Family Communication:wife at bedside Disposition Plan: home with wife and nursing assistant   Consultants:  PT to be consulted regarding pt ambulation at home  Procedures:  Head CT  UA  Antibiotics: Anti-infectives   Start     Dose/Rate Route Frequency Ordered Stop   11/25/12 1200  cefTRIAXone (ROCEPHIN) 1  g in dextrose 5 % 50 mL IVPB     1 g 100 mL/hr over 30 Minutes Intravenous Every 24 hours 11/24/12 1737     11/24/12 1315  cefTRIAXone (ROCEPHIN) 1 g in dextrose 5 % 50 mL IVPB     1 g 100 mL/hr over 30 Minutes Intravenous  Once 11/24/12 1304 11/24/12 1512       Objective: Filed Vitals:   11/25/12 0554  BP: 126/88  Pulse: 82  Temp: 98.4 F (36.9 C)  Resp: 20    Intake/Output Summary (Last 24 hours) at 11/25/12 0850 Last data filed at 11/25/12 0035  Gross per 24 hour  Intake      0 ml  Output    750 ml  Net   -750 ml   Filed Weights   11/25/12 0826  Weight: 91.7 kg (202 lb 2.6 oz)    Exam:   General:  wdwn elderly male sitting upright in bed being fed by wife  Cardiovascular: RRR, normal S1 S2, no m/g/r  Respiratory: clear to auscultation bilaterally, no inc work of breathing  Abdomen: +BS, nontender, nondistended, no guarding, no organomegaly or masses  Musculoskeletal: UE ROM intact, LE ROM reduced due to previous spinal surgery, +1 edema in LE  Data Reviewed: Basic Metabolic Panel:  Recent Labs Lab 11/24/12 1015 11/24/12 1846 11/25/12 0647  NA 141  --  143  K 3.7  --  3.6  CL 102  --  104  CO2 29  --  28  GLUCOSE 100*  --  114*  BUN 19  --  18  CREATININE 1.25 1.17 1.41*  CALCIUM 9.8  --  9.0  MG  --  1.7  --   PHOS  --  3.0  --    Liver Function Tests:  Recent Labs Lab 11/24/12 1015  AST 23  ALT 10  ALKPHOS 52  BILITOT 0.3  PROT 7.5  ALBUMIN 3.9    Recent Labs Lab 11/24/12 1057  AMMONIA 19   CBC:  Recent Labs Lab 11/24/12 1015 11/24/12 1846 11/25/12 0647  WBC 7.2 5.8 4.5  NEUTROABS 5.5  --   --   HGB 11.4* 11.2* 10.1*  HCT 34.7* 34.6* 31.1*  MCV 92.3 92.5 92.6  PLT 239 233 201   Cardiac Enzymes:  Recent Labs Lab 11/24/12 1015  TROPONINI <0.30   CBG:  Recent Labs Lab 11/24/12 1012  GLUCAP 97     Studies: Ct Head Wo Contrast  11/24/2012   CLINICAL DATA:  Weakness  EXAM: CT HEAD WITHOUT CONTRAST   TECHNIQUE: Contiguous axial images were obtained from the base of the skull through the vertex without intravenous contrast.  COMPARISON:  CT 02/27/2010  FINDINGS: Advanced atrophy. Ventricular enlargement is stable and felt to be related to atrophy.  Chronic microvascular ischemic change in the white matter. Negative for acute infarct, hemorrhage, or mass lesion.  IMPRESSION: Advanced atrophy. Chronic microvascular ischemic change in the white matter.  No acute abnormality and no change from 2012.   Electronically Signed   By: Marlan Palau M.D.   On: 11/24/2012 11:11   Dg Chest Portable 1 View  11/24/2012   CLINICAL DATA:  79 year old male with weakness and shortness of Breath. Initial encounter.  EXAM: PORTABLE CHEST - 1 VIEW  COMPARISON:  03/14/2012 and earlier.  FINDINGS: Portable AP view at 1034 hrs. Mildly larger lung volumes and improved ventilation at the left lung base. Stable cardiac size and mediastinal contours. Visualized tracheal air column is within normal limits. No pneumothorax, pulmonary edema, pleural effusion, or acute pulmonary opacity.  IMPRESSION: No acute cardiopulmonary abnormality.   Electronically Signed   By: Augusto Gamble M.D.   On: 11/24/2012 11:02    Scheduled Meds: . amLODipine  10 mg Oral q morning - 10a  . atenolol  50 mg Oral q morning - 10a  . cefTRIAXone (ROCEPHIN)  IV  1 g Intravenous Q24H  . citalopram  20 mg Oral Daily  . donepezil  10 mg Oral Daily  . mirabegron ER  25 mg Oral Daily  . sodium chloride  3 mL Intravenous Q12H  . tamsulosin  0.8 mg Oral QHS  . warfarin  3 mg Oral ONCE-1800  . Warfarin - Pharmacist Dosing Inpatient   Does not apply q1800   Continuous Infusions: . dextrose 5 % and 0.45 % NaCl with KCl 20 mEq/L 100 mL/hr at 11/25/12 0315    Principal Problem:   Toxic encephalopathy Active Problems:   Dementia   HTN (hypertension)   Abnormality of gait   UTI (lower urinary tract infection)    Time spent: 35    Dorinda Hill,  PA-S  [facility] Pager [phone] If 7PM-7AM, please contact night-coverage at [email or web address], password [employer] 11/25/2012, 8:50 AM  LOS: 1 day       Addendum  Patient seen and examined, chart and data base reviewed.  I agree with the above assessment and plan.  For full details please see Mrs. Dorinda Hill, PA-S note.  I reviewed and addended the above note  as needed.   Clint Lipps, MD [facility] Pager: [phone] 11/25/2012, 11:21 AM

## 2012-11-25 NOTE — Care Management Note (Unsigned)
    Page 1 of 1   11/25/2012     2:44:25 PM   CARE MANAGEMENT NOTE 11/25/2012  Patient:  Joshua Rodgers, Joshua Rodgers   Account Number:  [id number]  Date Initiated:  11/25/2012  Documentation initiated by:  Letha Cape  Subjective/Objective Assessment:   dx toxic encephalopathy, uti, vomiting,  admit- lives with spouse.     Action/Plan:   Anticipated DC Date:  11/27/2012   Anticipated DC Plan:  HOME W HOME HEALTH SERVICES         Choice offered to / List presented to:             Status of service:  In process, will continue to follow Medicare Important Message given?   (If response is "NO", the following Medicare IM given date fields will be blank) Date Medicare IM given:   Date Additional Medicare IM given:    Discharge Disposition:    Per UR Regulation:  Reviewed for med. necessity/level of care/duration of stay  If discussed at Long Length of Stay Meetings, dates discussed:    Comments:  11/25/12 14:43 Letha Cape RN, BSN [phone] patient lives with spouse, NCM will continue to follow for dc needs.

## 2012-11-25 NOTE — Progress Notes (Signed)
ANTICOAGULATION CONSULT NOTE - Follow Up Consult  Pharmacy Consult for Coumadin Indication: history of DVT  Allergies  Allergen Reactions  . Iohexol      Desc: sob, throat swelling (1988 gdc) pt requires full premeds and does well, JB 8/14/7     Labs:  Recent Labs  11/24/12 1015 11/24/12 1846 11/25/12 0647  HGB 11.4* 11.2* 10.1*  HCT 34.7* 34.6* 31.1*  PLT 239 233 201  LABPROT 19.4*  --  19.9*  INR 1.69*  --  1.75*  CREATININE 1.25 1.17 1.41*  TROPONINI <0.30  --   --     The CrCl is unknown because both a height and weight (above a minimum accepted value) are required for this calculation.  Assessment: 79 year old male admitted with confusion on Coumadin PTA for DVT history.  INR = 1.75, no bleeding noted  Goal of Therapy:  INR 2-3 Monitor platelets by anticoagulation protocol: Yes   Plan:  1) Repeat Coumadin 3 mg po x 1 today 2) Daily INR  Thank you. Okey Regal, PharmD [phone]  Elwin Sleight 11/25/2012,8:33 AM

## 2012-11-26 DIAGNOSIS — Z86718 Personal history of other venous thrombosis and embolism: Secondary | ICD-10-CM

## 2012-11-26 LAB — BASIC METABOLIC PANEL
BUN: 16 mg/dL (ref 6–23)
CO2: 29 mEq/L (ref 19–32)
Calcium: 8.6 mg/dL (ref 8.4–10.5)
Chloride: 104 mEq/L (ref 96–112)
Creatinine, Ser: 1.28 mg/dL (ref 0.50–1.35)
GFR calc Af Amer: 56 mL/min — ABNORMAL LOW (ref >90)
Glucose, Bld: 120 mg/dL — ABNORMAL HIGH (ref 70–99)
Sodium: 145 mEq/L (ref 135–145)

## 2012-11-26 LAB — PROTIME-INR
INR: 1.72 — ABNORMAL HIGH (ref 0.00–1.49)
Prothrombin Time: 19.7 seconds — ABNORMAL HIGH (ref 11.6–15.2)

## 2012-11-26 LAB — URINE CULTURE

## 2012-11-26 MED ORDER — CIPROFLOXACIN HCL 500 MG PO TABS: 500.0000 mg | ORAL_TABLET | Freq: Two times a day (BID) | ORAL | Status: DC

## 2012-11-26 MED ORDER — WARFARIN SODIUM 2 MG PO TABS: 2.0000 mg | ORAL_TABLET | ORAL | Status: DC

## 2012-11-26 MED ORDER — POTASSIUM CHLORIDE CRYS ER 20 MEQ PO TBCR: 60.0000 meq | EXTENDED_RELEASE_TABLET | Freq: Four times a day (QID) | ORAL | Status: DC

## 2012-11-26 MED ORDER — POTASSIUM CHLORIDE CRYS ER 20 MEQ PO TBCR
60.0000 meq | EXTENDED_RELEASE_TABLET | ORAL | Status: DC
  Administered 2012-11-26 (×2): 60 meq via ORAL
  Filled 2012-11-26 (×2): qty 3

## 2012-11-26 MED ORDER — WARFARIN SODIUM 4 MG PO TABS
4.0000 mg | ORAL_TABLET | ORAL | Status: DC
  Filled 2012-11-26: qty 1

## 2012-11-26 NOTE — Progress Notes (Signed)
ANTICOAGULATION CONSULT NOTE - Follow Up Consult  Pharmacy Consult for Coumadin Indication: history of DVT  Allergies  Allergen Reactions  . Iohexol      Desc: sob, throat swelling (1988 gdc) pt requires full premeds and does well, JB 8/14/7     Labs:  Recent Labs  11/24/12 1015 11/24/12 1846 11/25/12 0647 11/26/12 0622  HGB 11.4* 11.2* 10.1*  --   HCT 34.7* 34.6* 31.1*  --   PLT 239 233 201  --   LABPROT 19.4*  --  19.9* 19.7*  INR 1.69*  --  1.75* 1.72*  CREATININE 1.25 1.17 1.41*  --   TROPONINI <0.30  --   --   --     Estimated Creatinine Clearance: 42.8 ml/min (by C-G formula based on Cr of 1.41).  Assessment: 78 year old male admitted with confusion on Coumadin PTA for DVT history.  INR = 1.72, no bleeding noted  Goal of Therapy:  INR 2-3 Monitor platelets by anticoagulation protocol: Yes   Plan:  1) Coumadin 4 mg po TTSS, 2 mg MWF 2) Daily INR  Thank you. Okey Regal, PharmD [phone]  Elwin Sleight 11/26/2012,9:53 AM

## 2012-11-26 NOTE — Progress Notes (Signed)
.   NURSING PROGRESS NOTE  [name] [id number] Discharge Data: 11/26/2012 5:27 PM Attending Provider: No att. providers found WUJ:[id number],NWGNFA D, MD     Joshua Rodgers to be D/C'd Home per MD order.    All IV's discontinued with no bleeding noted.  All belongings returned to patient for patient to take home.   Last Vital Signs:  Blood pressure 152/87, pulse 93, temperature 97.6 F (36.4 C), temperature source Oral, resp. rate 20, height 5\' 11"  (1.803 m), weight 91.7 kg (202 lb 2.6 oz), SpO2 98.00%.  Discharge Medication List   Medication List         acetaminophen 500 MG tablet  Commonly known as:  TYLENOL  Take 1,000 mg by mouth once. For pain     ALPRAZolam 0.5 MG tablet  Commonly known as:  XANAX  Take 0.5 mg by mouth 3 (three) times daily as needed. For anxiety.     aMILoride 5 MG tablet  Commonly known as:  MIDAMOR  Take 10 mg by mouth 2 (two) times daily.     amLODipine 10 MG tablet  Commonly known as:  NORVASC  Take 10 mg by mouth every morning.     atenolol 50 MG tablet  Commonly known as:  TENORMIN  Take 50 mg by mouth every morning.     CALTRATE 600 PLUS-VIT D PO  Take 1 tablet by mouth 2 (two) times daily.     Cholecalciferol 2000 UNITS Caps  Take 1 capsule by mouth 2 (two) times daily.     ciprofloxacin 500 MG tablet  Commonly known as:  CIPRO  Take 1 tablet (500 mg total) by mouth 2 (two) times daily.     citalopram 20 MG tablet  Commonly known as:  CELEXA  Take 20 mg by mouth daily.     cyclobenzaprine 5 MG tablet  Commonly known as:  FLEXERIL  Take 5 mg by mouth 3 (three) times daily as needed for muscle spasms. For muscle spasms     donepezil 10 MG tablet  Commonly known as:  ARICEPT  Take 10 mg by mouth daily.     ferrous sulfate 324 (65 FE) MG Tbec  Take 1 tablet by mouth 2 (two) times daily.     MYRBETRIQ 25 MG Tb24 tablet  Generic drug:  mirabegron ER  Take 25 mg by mouth daily.     potassium chloride SA 20 MEQ tablet   Commonly known as:  K-DUR,KLOR-CON  Take 20 mEq by mouth 2 (two) times daily.     tamsulosin 0.4 MG Caps capsule  Commonly known as:  FLOMAX  Take 0.8 mg by mouth at bedtime.     VITAMIN B 12 PO  Take 1,000 mg by mouth daily.     warfarin 2 MG tablet  Commonly known as:  COUMADIN  Take 2 mg by mouth daily.        Madelin Rear, MSN, RN, [employer]

## 2012-11-26 NOTE — Progress Notes (Signed)
CRITICAL VALUE ALERT  Critical value received:  Potassium 2.6  Date of notification:  11/26/12  Time of notification:  1220  Critical value read back: yes  Nurse who received alert:  Madelin Rear, MSN, RN, CMSRN  MD notified (1st page):  [place]  Time of first page:  1221  MD notified (2nd page):  Time of second page:  Responding MD:  Ordered potassium  Time MD responded:  1221

## 2012-11-26 NOTE — Progress Notes (Signed)
   CARE MANAGEMENT NOTE 11/26/2012  Patient:  Joshua Rodgers, Joshua Rodgers   Account Number:  [id number]  Date Initiated:  11/25/2012  Documentation initiated by:  Letha Cape  Subjective/Objective Assessment:   dx toxic encephalopathy, uti, vomiting,  admit- lives with spouse.     Action/Plan:   discharge planning   Anticipated DC Date:  11/27/2012   Anticipated DC Plan:  HOME W HOME HEALTH SERVICES      DC Planning Services  CM consult      [facility] Choice  HOME HEALTH   Choice offered to / List presented to:  C-3 Spouse        HH arranged  HH-2 PT  HH-3 OT      [facility] agency  [facility]   Status of service:  Completed, signed off Medicare Important Message given?   (If response is "NO", the following Medicare IM given date fields will be blank) Date Medicare IM given:   Date Additional Medicare IM given:    Discharge Disposition:  HOME W HOME HEALTH SERVICES  Per UR Regulation:  Reviewed for med. necessity/level of care/duration of stay  If discussed at Long Length of Stay Meetings, dates discussed:    Comments:  11/26/12 14:15 CM spoke with pt, pt's wife, and pt's daughter in room to offer choice.  Pt's wife states they have had [facility] before and were very happy.  Address and contact numbers were verified.  Referral faxed to [facility].  No other CM needs wer communicated.  Freddy Jaksch, BSN, [place] [phone].  11/25/12 14:43 Letha Cape RN, BSN [phone] patient lives with spouse, NCM will continue to follow for dc needs.

## 2012-11-26 NOTE — Evaluation (Signed)
Physical Therapy Evaluation Patient Details Name: Joshua Rodgers MRN: [id number] DOB: [birth year] Today's Date: 11/26/2012 Time: 1638 (time modified to account for 1049-1109 earlier today)-1711 PT Time Calculation (min): 33 min  PT Assessment / Plan / Recommendation History of Present Illness  Pt adm with [facility]. With history of chronic lumbosacral radiculopathies, moderate-to-severe canal stenosis status post L3-L5 laminectomies and decompression by Dr. Mikal Plane in September 2010 has been wheelchair bound for a year  Clinical Impression  Patient evaluated by Physical Therapy with no further acute PT needs identified. Pt ready for d/c per MD and wife with son/daughter able to transfer pt from bed to chair. They agreed he is more stiff and weaker than previously and agreed to HHPT to try to get pt back to a one person transfer. See below for any follow-up Physial Therapy or equipment needs. PT is signing off. Thank you for this referral.     PT Assessment  Patient needs continued PT services    Follow Up Recommendations  Home health PT;Supervision/Assistance - 24 hour    Does the patient have the potential to tolerate intense rehabilitation      Barriers to Discharge        Equipment Recommendations  None recommended by PT    Recommendations for Other Services     Frequency      Precautions / Restrictions Precautions Precautions: Fall   Pertinent Vitals/Pain Grimaced with attempts to flex Lt knee >90 degrees to place foot on footrest.      Mobility  Bed Mobility Bed Mobility: Rolling Right;Rolling Left;Supine to Sit;Sitting - Scoot to [place] of Bed Rolling Right: 1: +2 Total assist Rolling Right: Patient Percentage: 10% Rolling Left: 1: +2 Total assist Rolling Left: Patient Percentage: 10% Supine to Sit: 1: +2 Total assist;HOB flat Supine to Sit: Patient Percentage: 0% Sitting - Scoot to Edge of Bed: 1: +2 Total assist Sitting - Scoot to Edge of Bed: Patient Percentage:  0% Details for Bed Mobility Assistance: pt becoming agitated when asked to assist; wife able to calm him down, however he did not assist much with process. Use of bed pad to pivot from supine to sit EOB and to lateral transfer into chair Transfers Transfers: Lateral/Scoot Transfers Lateral/Scoot Transfers: 1: +2 Total assist;With armrests removed Lateral Transfers: Patient Percentage: 0% Details for Transfer Assistance: son-in-law behind pt keeping him flexed forward at hips; wife and PT used bedpad to slide pt laterally into electric w/c seat Wheelchair Mobility Wheelchair Mobility: No (uses electric w/c; wife drives for him at times)    Exercises     PT Diagnosis: Generalized weakness  PT Problem List: Decreased strength;Decreased range of motion;Decreased balance;Decreased mobility;Decreased cognition PT Treatment Interventions:       PT Goals(Current goals can be found in the care plan section) Acute Rehab PT Goals Patient Stated Goal: wife desires home today PT Goal Formulation: No goals set, d/c therapy  Visit Information  Last PT Received On: 11/26/12 Assistance Needed: +3 or more History of Present Illness: Pt adm with [facility]. With history of chronic lumbosacral radiculopathies, moderate-to-severe canal stenosis status post L3-L5 laminectomies and decompression by Dr. Mikal Plane in September 2010 has been wheelchair bound for a year       Prior Functioning  Home Living Family/patient expects to be discharged to:: Private residence Living Arrangements: Spouse/significant other Available Help at Discharge: Family;Home health;Personal care attendant Type of Home: House Home Access: Ramped entrance Home Layout: One level Home Equipment: Wheelchair - power;Bedside commode;Other (comment) (sliding board;  hoyer lift; van with w/c lift) Prior Function Level of Independence: Needs assistance Gait / Transfers Assistance Needed: wife reports she can usually transfer pt herself with  bedpad under his hips (level transfer bed to electric w/c). On his bad days, it takes 2 people or use the hoyer lift.  ADL's / Homemaking Assistance Needed: total assist Comments: Wife reported they use a condom catheter with a leg bag and a depends at home. Discussed with RN if they can switch pt to a leg bag and decided to use mesh briefs with sanitary pad (wife had not brought a brief and hospital does not use them). PT left while nursing dressed pt so once he was up in his electric chair, he would be ready for d/c. Communication Communication: No difficulties    Cognition  Cognition Arousal/Alertness: Awake/alert Behavior During Therapy: Agitated (mildly agitated) Overall Cognitive Status: History of cognitive impairments - at baseline    Extremity/Trunk Assessment Upper Extremity Assessment Upper Extremity Assessment: Generalized weakness Lower Extremity Assessment Lower Extremity Assessment: RLE deficits/detail;LLE deficits/detail RLE Deficits / Details: PROM hip to 90, knee flexion to 110, ankle to 10 DF; strength <3/5 LLE Deficits / Details: PROM hip to 90, knee to 90, ankle to -10 DF, incr stiffness (?extensor tone) noted Cervical / Trunk Assessment Cervical / Trunk Assessment: Other exceptions Cervical / Trunk Exceptions: very stiff with limited rotation and flexion   Balance Balance Balance Assessed: Yes Static Sitting Balance Static Sitting - Balance Support: Feet supported Static Sitting - Level of Assistance: 2: Max assist Static Sitting - Comment/# of Minutes: leaning posterior (?resisting movement vs tone vs stiffness from being supine for 2 days  End of Session PT - End of Session Activity Tolerance: Patient tolerated treatment well Patient left: in chair;with family/visitor present Nurse Communication: Other (comment) (pt ready for d/c from PT perspective)  GP     Jolan Upchurch 11/26/2012, 5:34 PM Pager [phone]

## 2012-11-26 NOTE — Discharge Summary (Addendum)
Physician Discharge Summary  Joshua Rodgers:[id number] DOB: 01-04-1924 DOA: 11/24/2012  PCP: Joshua Apo, MD  Admit date: 11/24/2012 Discharge date: 11/26/2012  Time spent: 40 minutes  Recommendations for Outpatient Follow-up:  1. Followup with primary care physician within one week. 2. Check INR on Tuesday, 11/29/2012, patient is on Coumadin and ciprofloxacin. 3. Check potassium level on 11/29/2012  Discharge Diagnoses:  Principal Problem:   Toxic encephalopathy Active Problems:   Dementia   HTN (hypertension)   Abnormality of gait   UTI (lower urinary tract infection)   Discharge Condition: Stable  Diet recommendation: Heart healthy  Filed Weights   11/25/12 0826  Weight: 91.7 kg (202 lb 2.6 oz)    History of present illness:  Joshua Rodgers is a 79 y.o. male  With history of chronic lumbosacral radiculopathies, moderate-to-severe canal stenosis status post L3-L5 laminectomies and decompression by Joshua Rodgers in September 2010 has been wheelchair bound for a year with recent evaluation by neurologist Joshua Rodgers in 11/21/2012. Presents today with 2 day complaint of the above-mentioned symptoms the problem has been persistent and getting worse since onset. Nothing the wife is aware of makes the problem better or worse. She states that yesterday he was sleeping more than usual and has been doing less activity than usual.  In the ED patient was found to have a UTI. CT of head was negative. And we were consulted for management of toxic encephalopathy.  Hospital Course:   1. Acute encephalopathy: Likely acute toxic encephalopathy 6 secondary to UTI. Patient came in to the hospital with lethargy and acute delirium, his urinalysis is consistent with UTI. Patient treated with Rocephin and IV fluids. Urine culture showed Klebsiella. His encephalopathy resolved, he is back to his baseline per his wife was at bedside at the time of discharge.  2. Klebsiella pneumoniae UTI: As  mentioned above patient empirically started on Rocephin, and the time of discharge switched to oral ciprofloxacin to treat for 7 more days. This is treated as complicated UTI because of male gender and chronic [place] catheter.  3. History of DVT: Patient is on chronic Coumadin therapy, his INR 1.73 at the day of discharge, patient will be discharged on ciprofloxacin, INR to be monitored very closely, INR to be checked on Tuesday. I left to the discretion of his PCP if he wants to switch to one of the new novel anticoagulants as patient will be on anticoagulation for a long time because of immobility.  4. Dementia: Continue home medications.  5. Lower extremity weakness: Patient has history of a spinal stenosis, he needs assistance on a daily basis. He uses motorized wheelchair at home, and needs assistance with most of his ADLs. Discussed with his wife and patient and his spouse are not considering inpatient rehabilitation. Home PT/OT.  6. Hypokalemia: After the discharge paperwork the RN paced before critical potassium level of 2.6. Patient was asymptomatic with that. 60 mEq of potassium for 2 times every 4 hours (total of 120 mEq of potassium) was given prior to discharge. Patient and his wife understand the should contact Dr. Idelle Rodgers office on Monday to arrange for visit and blood work ASAP.  Procedures:  None  Consultations:  None  Discharge Exam: Filed Vitals:   11/26/12 0513  BP: 139/78  Pulse: 78  Temp: 98.3 F (36.8 C)  Resp: 20   General: Alert and awake, oriented x3, not in any acute distress. HEENT: anicteric sclera, pupils reactive to light and accommodation, EOMI CVS: S1-S2 clear,  no murmur rubs or gallops Chest: clear to auscultation bilaterally, no wheezing, rales or rhonchi Abdomen: soft nontender, nondistended, normal bowel sounds, no organomegaly Extremities: no cyanosis, clubbing or edema noted bilaterally Neuro: Cranial nerves II-XII intact, no focal neurological  deficits  Discharge Instructions  Discharge Orders   Future Appointments Provider Department Dept Phone   05/22/2013 11:30 AM Joshua Feinstein, MD [facility] [phone]   Future Orders Complete By Expires   Diet - low sodium heart healthy  As directed    Increase activity slowly  As directed        Medication List         acetaminophen 500 MG tablet  Commonly known as:  TYLENOL  Take 1,000 mg by mouth once. For pain     ALPRAZolam 0.5 MG tablet  Commonly known as:  XANAX  Take 0.5 mg by mouth 3 (three) times daily as needed. For anxiety.     aMILoride 5 MG tablet  Commonly known as:  MIDAMOR  Take 10 mg by mouth 2 (two) times daily.     amLODipine 10 MG tablet  Commonly known as:  NORVASC  Take 10 mg by mouth every morning.     atenolol 50 MG tablet  Commonly known as:  TENORMIN  Take 50 mg by mouth every morning.     CALTRATE 600 PLUS-VIT D PO  Take 1 tablet by mouth 2 (two) times daily.     Cholecalciferol 2000 UNITS Caps  Take 1 capsule by mouth 2 (two) times daily.     ciprofloxacin 500 MG tablet  Commonly known as:  CIPRO  Take 1 tablet (500 mg total) by mouth 2 (two) times daily.     citalopram 20 MG tablet  Commonly known as:  CELEXA  Take 20 mg by mouth daily.     cyclobenzaprine 5 MG tablet  Commonly known as:  FLEXERIL  Take 5 mg by mouth 3 (three) times daily as needed for muscle spasms. For muscle spasms     donepezil 10 MG tablet  Commonly known as:  ARICEPT  Take 10 mg by mouth daily.     ferrous sulfate 324 (65 FE) MG Tbec  Take 1 tablet by mouth 2 (two) times daily.     MYRBETRIQ 25 MG Tb24 tablet  Generic drug:  mirabegron ER  Take 25 mg by mouth daily.     potassium chloride SA 20 MEQ tablet  Commonly known as:  K-DUR,KLOR-CON  Take 20 mEq by mouth 2 (two) times daily.     tamsulosin 0.4 MG Caps capsule  Commonly known as:  FLOMAX  Take 0.8 mg by mouth at bedtime.     VITAMIN B 12 PO  Take 1,000 mg by mouth  daily.     warfarin 2 MG tablet  Commonly known as:  COUMADIN  Take 2 mg by mouth daily.       Allergies  Allergen Reactions  . Iohexol      Desc: sob, throat swelling (1988 gdc) pt requires full premeds and does well, JB 8/14/7        Follow-up Information   Follow up with POLITE,RONALD D, MD In 1 week.   Specialty:  Internal Medicine   Contact information:   [address] [place] [address] [phone]        The results of significant diagnostics from this hospitalization (including imaging, microbiology, ancillary and laboratory) are listed below for reference.    Significant Diagnostic  Studies: Ct Head Wo Contrast  11/24/2012   CLINICAL DATA:  Weakness  EXAM: CT HEAD WITHOUT CONTRAST  TECHNIQUE: Contiguous axial images were obtained from the base of the skull through the vertex without intravenous contrast.  COMPARISON:  CT 02/27/2010  FINDINGS: Advanced atrophy. Ventricular enlargement is stable and felt to be related to atrophy.  Chronic microvascular ischemic change in the white matter. Negative for acute infarct, hemorrhage, or mass lesion.  IMPRESSION: Advanced atrophy. Chronic microvascular ischemic change in the white matter.  No acute abnormality and no change from 2012.   Electronically Signed   By: Marlan Palau M.D.   On: 11/24/2012 11:11   Dg Chest Portable 1 View  11/24/2012   CLINICAL DATA:  79 year old male with weakness and shortness of Breath. Initial encounter.  EXAM: PORTABLE CHEST - 1 VIEW  COMPARISON:  03/14/2012 and earlier.  FINDINGS: Portable AP view at 1034 hrs. Mildly larger lung volumes and improved ventilation at the left lung base. Stable cardiac size and mediastinal contours. Visualized tracheal air column is within normal limits. No pneumothorax, pulmonary edema, pleural effusion, or acute pulmonary opacity.  IMPRESSION: No acute cardiopulmonary abnormality.   Electronically Signed   By: Augusto Gamble M.D.   On: 11/24/2012  11:02    Microbiology: Recent Results (from the past 240 hour(s))  URINE CULTURE     Status: None   Collection Time    11/24/12 12:28 PM      Result Value Range Status   Specimen Description URINE, CATHETERIZED   Final   Special Requests NONE   Final   Culture  Setup Time     Final   Value: 11/24/2012 17:47     Performed at [facility]   Culture     Final   Value: >=100,000 COLONIES/mL KLEBSIELLA PNEUMONIAE     Performed at [facility]   Report Status 11/26/2012 FINAL   Final   Organism ID, Bacteria KLEBSIELLA PNEUMONIAE   Final     Labs: Basic Metabolic Panel:  Recent Labs Lab 11/24/12 1015 11/24/12 1846 11/25/12 0647  NA 141  --  143  K 3.7  --  3.6  CL 102  --  104  CO2 29  --  28  GLUCOSE 100*  --  114*  BUN 19  --  18  CREATININE 1.25 1.17 1.41*  CALCIUM 9.8  --  9.0  MG  --  1.7  --   PHOS  --  3.0  --    Liver Function Tests:  Recent Labs Lab 11/24/12 1015  AST 23  ALT 10  ALKPHOS 52  BILITOT 0.3  PROT 7.5  ALBUMIN 3.9   No results found for this basename: LIPASE, AMYLASE,  in the last 168 hours  Recent Labs Lab 11/24/12 1057  AMMONIA 19   CBC:  Recent Labs Lab 11/24/12 1015 11/24/12 1846 11/25/12 0647  WBC 7.2 5.8 4.5  NEUTROABS 5.5  --   --   HGB 11.4* 11.2* 10.1*  HCT 34.7* 34.6* 31.1*  MCV 92.3 92.5 92.6  PLT 239 233 201   Cardiac Enzymes:  Recent Labs Lab 11/24/12 1015  TROPONINI <0.30   BNP: BNP (last 3 results) No results found for this basename: PROBNP,  in the last 8760 hours CBG:  Recent Labs Lab 11/24/12 1012  GLUCAP 97       Signed:  Yitta Gongaware A  [facility] 11/26/2012, 12:08 PM

## 2012-12-11 ENCOUNTER — Other Ambulatory Visit: Payer: Self-pay | Admitting: Neurology

## 2013-02-12 ENCOUNTER — Inpatient Hospital Stay (HOSPITAL_COMMUNITY)
Admission: EM | Admit: 2013-02-12 | Discharge: 2013-02-16 | DRG: 378 | Disposition: A | Discharge status: Home / Self Care | Payer: Medicare PPO | Attending: Internal Medicine | Admitting: Internal Medicine

## 2013-02-12 ENCOUNTER — Encounter (HOSPITAL_COMMUNITY): Payer: Self-pay | Admitting: Emergency Medicine

## 2013-02-12 ENCOUNTER — Emergency Department (HOSPITAL_COMMUNITY): Payer: Medicare PPO

## 2013-02-12 DIAGNOSIS — Z96659 Presence of unspecified artificial knee joint: Secondary | ICD-10-CM

## 2013-02-12 DIAGNOSIS — Z7901 Long term (current) use of anticoagulants: Secondary | ICD-10-CM

## 2013-02-12 DIAGNOSIS — R609 Edema, unspecified: Secondary | ICD-10-CM | POA: Diagnosis present

## 2013-02-12 DIAGNOSIS — A498 Other bacterial infections of unspecified site: Secondary | ICD-10-CM | POA: Diagnosis present

## 2013-02-12 DIAGNOSIS — I509 Heart failure, unspecified: Secondary | ICD-10-CM | POA: Diagnosis present

## 2013-02-12 DIAGNOSIS — E876 Hypokalemia: Secondary | ICD-10-CM | POA: Diagnosis present

## 2013-02-12 DIAGNOSIS — I10 Essential (primary) hypertension: Secondary | ICD-10-CM | POA: Diagnosis present

## 2013-02-12 DIAGNOSIS — C61 Malignant neoplasm of prostate: Secondary | ICD-10-CM | POA: Diagnosis present

## 2013-02-12 DIAGNOSIS — I2782 Chronic pulmonary embolism: Secondary | ICD-10-CM | POA: Diagnosis present

## 2013-02-12 DIAGNOSIS — Z86718 Personal history of other venous thrombosis and embolism: Secondary | ICD-10-CM

## 2013-02-12 DIAGNOSIS — R109 Unspecified abdominal pain: Secondary | ICD-10-CM

## 2013-02-12 DIAGNOSIS — Z993 Dependence on wheelchair: Secondary | ICD-10-CM

## 2013-02-12 DIAGNOSIS — N312 Flaccid neuropathic bladder, not elsewhere classified: Secondary | ICD-10-CM

## 2013-02-12 DIAGNOSIS — K921 Melena: Secondary | ICD-10-CM | POA: Diagnosis present

## 2013-02-12 DIAGNOSIS — F0390 Unspecified dementia without behavioral disturbance: Secondary | ICD-10-CM | POA: Diagnosis present

## 2013-02-12 DIAGNOSIS — Z8546 Personal history of malignant neoplasm of prostate: Secondary | ICD-10-CM

## 2013-02-12 DIAGNOSIS — Z923 Personal history of irradiation: Secondary | ICD-10-CM

## 2013-02-12 DIAGNOSIS — E269 Hyperaldosteronism, unspecified: Secondary | ICD-10-CM | POA: Diagnosis present

## 2013-02-12 DIAGNOSIS — I5032 Chronic diastolic (congestive) heart failure: Secondary | ICD-10-CM | POA: Diagnosis present

## 2013-02-12 DIAGNOSIS — Z87891 Personal history of nicotine dependence: Secondary | ICD-10-CM

## 2013-02-12 DIAGNOSIS — N390 Urinary tract infection, site not specified: Secondary | ICD-10-CM | POA: Diagnosis present

## 2013-02-12 DIAGNOSIS — E785 Hyperlipidemia, unspecified: Secondary | ICD-10-CM | POA: Diagnosis present

## 2013-02-12 DIAGNOSIS — M48061 Spinal stenosis, lumbar region without neurogenic claudication: Secondary | ICD-10-CM | POA: Diagnosis present

## 2013-02-12 DIAGNOSIS — R1310 Dysphagia, unspecified: Secondary | ICD-10-CM | POA: Diagnosis present

## 2013-02-12 LAB — CBC WITH DIFFERENTIAL/PLATELET
Basophils Absolute: 0 10*3/uL (ref 0.0–0.1)
Basophils Relative: 0 % (ref 0–1)
EOS ABS: 0 10*3/uL (ref 0.0–0.7)
EOS PCT: 1 % (ref 0–5)
HCT: 36.6 % — ABNORMAL LOW (ref 39.0–52.0)
Hemoglobin: 11.7 g/dL — ABNORMAL LOW (ref 13.0–17.0)
Lymphocytes Relative: 24 % (ref 12–46)
Lymphs Abs: 0.9 10*3/uL (ref 0.7–4.0)
MCH: 29.7 pg (ref 26.0–34.0)
MCHC: 32 g/dL (ref 30.0–36.0)
MCV: 92.9 fL (ref 78.0–100.0)
MONOS PCT: 10 % (ref 3–12)
Monocytes Absolute: 0.4 10*3/uL (ref 0.1–1.0)
Neutro Abs: 2.4 10*3/uL (ref 1.7–7.7)
Neutrophils Relative %: 65 % (ref 43–77)
PLATELETS: 198 10*3/uL (ref 150–400)
RBC: 3.94 MIL/uL — ABNORMAL LOW (ref 4.22–5.81)
RDW: 14.4 % (ref 11.5–15.5)
WBC: 3.7 10*3/uL — ABNORMAL LOW (ref 4.0–10.5)

## 2013-02-12 LAB — COMPREHENSIVE METABOLIC PANEL
ALT: 10 U/L (ref 0–53)
AST: 18 U/L (ref 0–37)
Albumin: 3.7 g/dL (ref 3.5–5.2)
Alkaline Phosphatase: 54 U/L (ref 39–117)
BUN: 19 mg/dL (ref 6–23)
CALCIUM: 9.6 mg/dL (ref 8.4–10.5)
CO2: 30 mEq/L (ref 19–32)
Chloride: 101 mEq/L (ref 96–112)
Creatinine, Ser: 1.18 mg/dL (ref 0.50–1.35)
GFR calc non Af Amer: 54 mL/min — ABNORMAL LOW (ref >90)
GFR, EST AFRICAN AMERICAN: 62 mL/min — AB (ref >90)
GLUCOSE: 112 mg/dL — AB (ref 70–99)
Potassium: 3.6 mEq/L — ABNORMAL LOW (ref 3.7–5.3)
SODIUM: 143 meq/L (ref 137–147)
TOTAL PROTEIN: 6.8 g/dL (ref 6.0–8.3)
Total Bilirubin: 0.3 mg/dL (ref 0.3–1.2)

## 2013-02-12 LAB — URINALYSIS, ROUTINE W REFLEX MICROSCOPIC
Bilirubin Urine: NEGATIVE
Glucose, UA: NEGATIVE mg/dL
KETONES UR: NEGATIVE mg/dL
NITRITE: POSITIVE — AB
Protein, ur: NEGATIVE mg/dL
Specific Gravity, Urine: 1.017 (ref 1.005–1.030)
UROBILINOGEN UA: 0.2 mg/dL (ref 0.0–1.0)
pH: 7 (ref 5.0–8.0)

## 2013-02-12 LAB — LIPASE, BLOOD: Lipase: 119 U/L — ABNORMAL HIGH (ref 11–59)

## 2013-02-12 LAB — PROTIME-INR
INR: 2.72 — ABNORMAL HIGH (ref 0.00–1.49)
Prothrombin Time: 27.9 seconds — ABNORMAL HIGH (ref 11.6–15.2)

## 2013-02-12 LAB — URINE MICROSCOPIC-ADD ON

## 2013-02-12 LAB — APTT: APTT: 37 s (ref 24–37)

## 2013-02-12 LAB — TROPONIN I: Troponin I: <0.3 ng/mL (ref <0.30)

## 2013-02-12 LAB — OCCULT BLOOD, POC DEVICE: Fecal Occult Bld: POSITIVE — AB

## 2013-02-12 MED ORDER — SODIUM CHLORIDE 0.9 % IV BOLUS (SEPSIS)
1000.0000 mL | Freq: Once | INTRAVENOUS | Status: AC
  Administered 2013-02-12: 1000 mL via INTRAVENOUS

## 2013-02-12 MED ORDER — CEFTRIAXONE SODIUM 1 G IJ SOLR
1.0000 g | Freq: Once | INTRAMUSCULAR | Status: AC
  Administered 2013-02-12: 1 g via INTRAVENOUS
  Filled 2013-02-12: qty 10

## 2013-02-12 MED ORDER — SODIUM CHLORIDE 0.9 % IV BOLUS (SEPSIS)
1000.0000 mL | Freq: Once | INTRAVENOUS | Status: AC
  Administered 2013-02-13: 1000 mL via INTRAVENOUS

## 2013-02-12 MED ORDER — BARIUM SULFATE 2.1 % PO SUSP
900.0000 mL | ORAL | Status: AC
  Administered 2013-02-12: 900 mL via ORAL

## 2013-02-12 NOTE — ED Provider Notes (Signed)
Patient care assumed from [facility], PA-C at shift change. Patient presents for black tarry stool. He is hemoccult positive today. H/H appears stable. Patient also with abdominal pain. CT scan shows bladder wall thickening c/w cystitis without other acute intraabdominal process. UA today suggests infection. Of note, patient does have foley catheter. Rocephin administered given UA and CT findings. Have consulted Dr. Posey Pronto of [facility] who will admit. Temp admit orders placed.   Filed Vitals:   02/12/13 2045 02/12/13 2144 02/12/13 2200 02/12/13 2309  BP: 177/93 163/94 172/94 151/93  Pulse: 76 80 80 75  Temp:      TempSrc:      Resp: 18 22 19 25   SpO2:  98% 100% 97%    Results for orders placed during the hospital encounter of 02/12/13  CBC WITH DIFFERENTIAL      Result Value Range   WBC 3.7 (*) 4.0 - 10.5 K/uL   RBC 3.94 (*) 4.22 - 5.81 MIL/uL   Hemoglobin 11.7 (*) 13.0 - 17.0 g/dL   HCT 36.6 (*) 39.0 - 52.0 %   MCV 92.9  78.0 - 100.0 fL   MCH 29.7  26.0 - 34.0 pg   MCHC 32.0  30.0 - 36.0 g/dL   RDW 14.4  11.5 - 15.5 %   Platelets 198  150 - 400 K/uL   Neutrophils Relative % 65  43 - 77 %   Neutro Abs 2.4  1.7 - 7.7 K/uL   Lymphocytes Relative 24  12 - 46 %   Lymphs Abs 0.9  0.7 - 4.0 K/uL   Monocytes Relative 10  3 - 12 %   Monocytes Absolute 0.4  0.1 - 1.0 K/uL   Eosinophils Relative 1  0 - 5 %   Eosinophils Absolute 0.0  0.0 - 0.7 K/uL   Basophils Relative 0  0 - 1 %   Basophils Absolute 0.0  0.0 - 0.1 K/uL  COMPREHENSIVE METABOLIC PANEL      Result Value Range   Sodium 143  137 - 147 mEq/L   Potassium 3.6 (*) 3.7 - 5.3 mEq/L   Chloride 101  96 - 112 mEq/L   CO2 30  19 - 32 mEq/L   Glucose, Bld 112 (*) 70 - 99 mg/dL   BUN 19  6 - 23 mg/dL   Creatinine, Ser 1.18  0.50 - 1.35 mg/dL   Calcium 9.6  8.4 - 10.5 mg/dL   Total Protein 6.8  6.0 - 8.3 g/dL   Albumin 3.7  3.5 - 5.2 g/dL   AST 18  0 - 37 U/L   ALT 10  0 - 53 U/L   Alkaline Phosphatase 54  39 - 117 U/L   Total  Bilirubin 0.3  0.3 - 1.2 mg/dL   GFR calc non Af Amer 54 (*) >90 mL/min   GFR calc Af Amer 62 (*) >90 mL/min  PROTIME-INR      Result Value Range   Prothrombin Time 27.9 (*) 11.6 - 15.2 seconds   INR 2.72 (*) 0.00 - 1.49  APTT      Result Value Range   aPTT 37  24 - 37 seconds  URINALYSIS, ROUTINE W REFLEX MICROSCOPIC      Result Value Range   Color, Urine YELLOW  YELLOW   APPearance TURBID (*) CLEAR   Specific Gravity, Urine 1.017  1.005 - 1.030   pH 7.0  5.0 - 8.0   Glucose, UA NEGATIVE  NEGATIVE mg/dL   Hgb urine dipstick  SMALL (*) NEGATIVE   Bilirubin Urine NEGATIVE  NEGATIVE   Ketones, ur NEGATIVE  NEGATIVE mg/dL   Protein, ur NEGATIVE  NEGATIVE mg/dL   Urobilinogen, UA 0.2  0.0 - 1.0 mg/dL   Nitrite POSITIVE (*) NEGATIVE   Leukocytes, UA LARGE (*) NEGATIVE  LIPASE, BLOOD      Result Value Range   Lipase 119 (*) 11 - 59 U/L  TROPONIN I      Result Value Range   Troponin I <0.30  <0.30 ng/mL  URINE MICROSCOPIC-ADD ON      Result Value Range   Squamous Epithelial / LPF FEW (*) RARE   WBC, UA 21-50  <3 WBC/hpf   RBC / HPF 3-6  <3 RBC/hpf   Bacteria, UA MANY (*) RARE  OCCULT BLOOD, POC DEVICE      Result Value Range   Fecal Occult Bld POSITIVE (*) NEGATIVE   Ct Abdomen Pelvis Wo Contrast  02/12/2013   CLINICAL DATA:  Black tarry stools, rigid abdomen. Suspect urinary tract infection. Status post left renal tumor ablation.  EXAM: CT ABDOMEN AND PELVIS WITHOUT CONTRAST  TECHNIQUE: Multidetector CT imaging of the abdomen and pelvis was performed following the standard protocol without intravenous contrast.  COMPARISON:  CT of the abdomen April 15, 2007  FINDINGS: Included view of the lung bases are clear though, there is mild respiratory motion. Heart size is normal. Mild aortic annular calcifications. The included pericardium is unremarkable.  Small hiatal hernia. The stomach, small and large bowel are normal in course and caliber without inflammatory changes. Normal appendix.  There may be a small component of rectal prolapse. No intraperitoneal free fluid nor free air.  The liver, spleen, pancreas, gallbladder are unremarkable for this noncontrast examination. 22 x 25 mm left adrenal nodule (9 Hounsfield units), 11 x 13 mm right adrenal nodule (-14 Hounsfield units) both of which are representative of benign adrenal adenomas.  2 mm nonobstructing right lower pole renal calculus associated with mild scarring. No hydronephrosis. No left nephrolithiasis. 10 mm cyst in right interpolar kidney is unchanged. Ureters are normal in course and caliber. Eccentric posterior urinary bladder wall thickening measures 11 mm.  Great vessels are overall normal in course and caliber with moderate calcific atherosclerosis. Small right fat containing inguinal hernia. Left pelvic iliopsoas muscle intramuscular lipoma. Moderate fat containing umbilical hernia. Severe L5-S1 degenerative disc disease. Status post L4-5 laminectomies.  IMPRESSION: Eccentric posterior urinary bladder wall thickening, and of this could reflect cystitis, tumor could have a similar appearance. Consider cystoscopy upon resolution of acute symptoms.  No acute intra-abdominal or pelvic process.  Right lower pole renal scarring associated with a nonobstructing 2 mm calculus.   Electronically Signed   By: Elon Alas   On: 02/12/2013 23:22      Antonietta Breach, PA-C 02/12/13 2350

## 2013-02-12 NOTE — ED Notes (Signed)
Phlebotomy notified that blood still needs to be drawn.

## 2013-02-12 NOTE — ED Notes (Signed)
Pt returned from radiology.

## 2013-02-12 NOTE — ED Notes (Signed)
Called CT to inform pt finished drinking contrast and ready for scan. 

## 2013-02-12 NOTE — ED Provider Notes (Signed)
CSN: [id number]     Arrival date & time 02/12/13  1827 History   First MD Initiated Contact with Patient 02/12/13 1831     Chief Complaint  Patient presents with  . Abdominal Pain   (Consider location/radiation/quality/duration/timing/severity/associated sxs/prior Treatment) The history is provided by the patient and the spouse.  Joshua Rodgers is an 80 y/o M with PMHx of hypokalemia, generalized weakness, renal disorder, HTN, depression, DVT - currently on anticoagulation therapy (coumadin), asthma, DJD, lumbar stenosis brought in by EMS and accompanied by family regarding abdominal pain that has been ongoing for one week. Difficult history secondary to patient having dementia. Patient reported that he feels a fullness in his abdomen - reported that he gets full quickly. Reported that he has not been passing much flatulence. Stated that he has not had a bowel movement within the past 2 days. Wife reported that the patient's stools has been black and tarry. Reported that with each bowel movement it has been occurring this way. Wife reported that the patient has been having difficulty with swallowing for the past couple of weeks - reported that the food does not go down as easily. Patient reported that he has a foley attached to him - wife reported the catheter was changed at 3:00PM this afternoon. Wife reported that the patient does not ambulate secondary to lumbar stenosis - patient is wheel chair bound. Patient wears catheter due to incontinence. Wife reported chronic left sided facial drooping - denied new neurological issues. Reported that the patient has been getting easily agitated, but wife reported that this issue has been ongoing for approximately one year. Denied nausea, vomiting, fever, chills, chest pain, shortness of breath, difficulty breathing, speech changes, facial drooping, changes to behavior, easy agitation. Wife reported no new changes to personality, aggressive behavior, mental status  change.  PCP Dr. Delfina Redwood  Past Medical History  Diagnosis Date  . Generalized weakness   . Hypokalemia   . Renal disorder   . Hyperlipidemia   . Vitamin D deficiency   . Bell's palsy   . Lumbar stenosis   . Hypertension   . Depression   . Dementia   . Prostate cancer ~ 2004    "had radiation tx" (11/24/2012)  . DVT (deep venous thrombosis)     "he's had them in both legs" (11/24/2012)  . Asthma     "as a child, real bad" (11/24/2012)  . History of bleeding ulcers ~ 1956    "in hospital for ~ 1 month" (11/24/2012)  . Recurrent UTI (urinary tract infection) 2013-now    "lots" (11/24/2012)  . DJD (degenerative joint disease)   . Arthritis     "joints" (11/24/2012)  . Anxiety    Past Surgical History  Procedure Laterality Date  . Total knee arthroplasty Left 02/2010  . Back surgery  2008    "for stenosis" (11/24/2012)   History reviewed. No pertinent family history. History  Substance Use Topics  . Smoking status: Former Smoker -- 10 years    Types: Cigarettes  . Smokeless tobacco: Never Used     Comment: 11/24/2012 "quit smoking cigarettes in ~ 1956"  . Alcohol Use: No    Review of Systems  Constitutional: Negative for fever and chills.  Respiratory: Negative for chest tightness and shortness of breath.   Cardiovascular: Negative for chest pain.  Gastrointestinal: Positive for abdominal pain, constipation and blood in stool (black tarry stools). Negative for nausea and vomiting.  Musculoskeletal: Negative for back pain.  Neurological: Positive  for weakness. Negative for dizziness.  Psychiatric/Behavioral: Negative for behavioral problems.  All other systems reviewed and are negative.    Allergies  Iohexol  Home Medications   Current Outpatient Rx  Name  Route  Sig  Dispense  Refill  . ALPRAZolam (XANAX) 0.5 MG tablet   Oral   Take 0.5 mg by mouth 3 (three) times daily as needed for anxiety. For anxiety.         Marland Kitchen aMILoride (MIDAMOR) 5 MG tablet    Oral   Take 10 mg by mouth daily.         Marland Kitchen amLODipine (NORVASC) 10 MG tablet   Oral   Take 10 mg by mouth daily.         Marland Kitchen atenolol (TENORMIN) 50 MG tablet   Oral   Take 50 mg by mouth every morning.         . Calcium-Vitamin D (CALTRATE 600 PLUS-VIT D PO)   Oral   Take 1 tablet by mouth 2 (two) times daily.         . cholecalciferol (VITAMIN D) 1000 UNITS tablet   Oral   Take 1,000 Units by mouth daily.         . citalopram (CELEXA) 20 MG tablet   Oral   Take 20 mg by mouth daily.         . Cyanocobalamin (VITAMIN B 12 PO)   Oral   Take 1,000 mg by mouth daily.         Marland Kitchen donepezil (ARICEPT) 10 MG tablet   Oral   Take 10 mg by mouth daily.         . ferrous sulfate 324 (65 FE) MG TBEC   Oral   Take 1 tablet by mouth 2 (two) times daily.         . Glucosamine-Chondroit-Vit C-Mn (GLUCOSAMINE CHONDR 1500 COMPLX) CAPS   Oral   Take 1 capsule by mouth daily.         . potassium chloride SA (K-DUR,KLOR-CON) 20 MEQ tablet   Oral   Take 20 mEq by mouth 2 (two) times daily.         . Tamsulosin HCl (FLOMAX) 0.4 MG CAPS   Oral   Take 0.8 mg by mouth at bedtime.         Marland Kitchen warfarin (COUMADIN) 2 MG tablet   Oral   Take 1-2 mg by mouth daily. Take 1/2 tablet (1mg ) on saturdays and sundays all other days take 1tablet (2mg )          BP 165/90  Pulse 69  Temp(Src) 98.1 F (36.7 C) (Oral)  Resp 24  SpO2 97% Physical Exam  Nursing note and vitals reviewed. Constitutional: He is oriented to person, place, and time. He appears well-developed and well-nourished. No distress.  HENT:  Head: Normocephalic and atraumatic.  Mouth/Throat: No oropharyngeal exudate.  Dry mucous membranes  Eyes: Conjunctivae are normal. Pupils are equal, round, and reactive to light. Right eye exhibits no discharge. Left eye exhibits no discharge.  Neck: Normal range of motion. Neck supple.  Cardiovascular: Normal rate, regular rhythm and normal heart sounds.    Pulses:      Radial pulses are 2+ on the right side, and 2+ on the left side.       Dorsalis pedis pulses are 2+ on the right side, and 2+ on the left side.  Pulmonary/Chest: Effort normal. He has no wheezes. He has no rales.  Negative respiratory distress Negative  use of accessory muscles Patient able to speak in full sentences without difficulty  Decreased breath sounds to upper and lower lobes bilaterally  Abdominal: Soft. Normal appearance and bowel sounds are normal. He exhibits no fluid wave and no ascites. There is generalized tenderness. There is guarding.  Discomfort upon palpation noted - generalized. When abdomen palpated patient tightens up his stomach - positive guarding to entire abdomen.   Genitourinary:  Went to perform rectal exam, stool in diaper. Patient made a bowel movement. Dark black, tarry stool identified.  Musculoskeletal: Normal range of motion.  Decreased ROM to bilateral lower extremities - chronic issue  Neurological: He is alert and oriented to person, place, and time. No cranial nerve deficit. He exhibits normal muscle tone. Coordination normal.  Cranial nerves III-XII grossly intact Strength 5+/5+ to upper and lower extremities bilaterally with resistance applied, equal distribution noted Sensation intact  Negative slurred speech Patient able to bring finger to nose without difficulty or ataxia Mild facial weakness to the left side - wife reported that that has been present since the 1950's Patient follows commands properly Patient has proper coordination to upper extremities bilaterally  Skin: Skin is warm and dry. No rash noted. He is not diaphoretic. No erythema.  Psychiatric: He has a normal mood and affect. His behavior is normal. Thought content normal.    ED Course  Procedures (including critical care time)  This provider reviewed the patient's chart. Patient has been seen in the ED setting numerous times regarding AMS complaints - numerous CT  scans of the head without contrast have been performed with negative findings. Numerous times it is driven from UTI - patient has chronic UTI secondary to catheter. Patient presenting with dysphagia today - patient had a barium swallow test performed in 12/2010 with unremarkable findings.   7:45 PM This provider spoke with Maudie Mercury from [facility] - discussed wanting to get CT scan of abdomen and pelvis but patient allergic to Iohexol. Reported that patient can get PO contrast instead.   Results for orders placed during the hospital encounter of 02/12/13  CBC WITH DIFFERENTIAL      Result Value Range   WBC 3.7 (*) 4.0 - 10.5 K/uL   RBC 3.94 (*) 4.22 - 5.81 MIL/uL   Hemoglobin 11.7 (*) 13.0 - 17.0 g/dL   HCT 36.6 (*) 39.0 - 52.0 %   MCV 92.9  78.0 - 100.0 fL   MCH 29.7  26.0 - 34.0 pg   MCHC 32.0  30.0 - 36.0 g/dL   RDW 14.4  11.5 - 15.5 %   Platelets 198  150 - 400 K/uL   Neutrophils Relative % 65  43 - 77 %   Neutro Abs 2.4  1.7 - 7.7 K/uL   Lymphocytes Relative 24  12 - 46 %   Lymphs Abs 0.9  0.7 - 4.0 K/uL   Monocytes Relative 10  3 - 12 %   Monocytes Absolute 0.4  0.1 - 1.0 K/uL   Eosinophils Relative 1  0 - 5 %   Eosinophils Absolute 0.0  0.0 - 0.7 K/uL   Basophils Relative 0  0 - 1 %   Basophils Absolute 0.0  0.0 - 0.1 K/uL  COMPREHENSIVE METABOLIC PANEL      Result Value Range   Sodium 143  137 - 147 mEq/L   Potassium 3.6 (*) 3.7 - 5.3 mEq/L   Chloride 101  96 - 112 mEq/L   CO2 30  19 - 32 mEq/L  Glucose, Bld 112 (*) 70 - 99 mg/dL   BUN 19  6 - 23 mg/dL   Creatinine, Ser 1.18  0.50 - 1.35 mg/dL   Calcium 9.6  8.4 - 10.5 mg/dL   Total Protein 6.8  6.0 - 8.3 g/dL   Albumin 3.7  3.5 - 5.2 g/dL   AST 18  0 - 37 U/L   ALT 10  0 - 53 U/L   Alkaline Phosphatase 54  39 - 117 U/L   Total Bilirubin 0.3  0.3 - 1.2 mg/dL   GFR calc non Af Amer 54 (*) >90 mL/min   GFR calc Af Amer 62 (*) >90 mL/min  PROTIME-INR      Result Value Range   Prothrombin Time 27.9 (*) 11.6 - 15.2 seconds   INR  2.72 (*) 0.00 - 1.49  APTT      Result Value Range   aPTT 37  24 - 37 seconds  URINALYSIS, ROUTINE W REFLEX MICROSCOPIC      Result Value Range   Color, Urine YELLOW  YELLOW   APPearance TURBID (*) CLEAR   Specific Gravity, Urine 1.017  1.005 - 1.030   pH 7.0  5.0 - 8.0   Glucose, UA NEGATIVE  NEGATIVE mg/dL   Hgb urine dipstick SMALL (*) NEGATIVE   Bilirubin Urine NEGATIVE  NEGATIVE   Ketones, ur NEGATIVE  NEGATIVE mg/dL   Protein, ur NEGATIVE  NEGATIVE mg/dL   Urobilinogen, UA 0.2  0.0 - 1.0 mg/dL   Nitrite POSITIVE (*) NEGATIVE   Leukocytes, UA LARGE (*) NEGATIVE  LIPASE, BLOOD      Result Value Range   Lipase 119 (*) 11 - 59 U/L  TROPONIN I      Result Value Range   Troponin I <0.30  <0.30 ng/mL  URINE MICROSCOPIC-ADD ON      Result Value Range   Squamous Epithelial / LPF FEW (*) RARE   WBC, UA 21-50  <3 WBC/hpf   RBC / HPF 3-6  <3 RBC/hpf   Bacteria, UA MANY (*) RARE  OCCULT BLOOD, POC DEVICE      Result Value Range   Fecal Occult Bld POSITIVE (*) NEGATIVE    Labs Review Labs Reviewed  CBC WITH DIFFERENTIAL - Abnormal; Notable for the following:    WBC 3.7 (*)    RBC 3.94 (*)    Hemoglobin 11.7 (*)    HCT 36.6 (*)    All other components within normal limits  COMPREHENSIVE METABOLIC PANEL - Abnormal; Notable for the following:    Potassium 3.6 (*)    Glucose, Bld 112 (*)    GFR calc non Af Amer 54 (*)    GFR calc Af Amer 62 (*)    All other components within normal limits  PROTIME-INR - Abnormal; Notable for the following:    Prothrombin Time 27.9 (*)    INR 2.72 (*)    All other components within normal limits  URINALYSIS, ROUTINE W REFLEX MICROSCOPIC - Abnormal; Notable for the following:    APPearance TURBID (*)    Hgb urine dipstick SMALL (*)    Nitrite POSITIVE (*)    Leukocytes, UA LARGE (*)    All other components within normal limits  LIPASE, BLOOD - Abnormal; Notable for the following:    Lipase 119 (*)    All other components within normal  limits  URINE MICROSCOPIC-ADD ON - Abnormal; Notable for the following:    Squamous Epithelial / LPF FEW (*)  Bacteria, UA MANY (*)    All other components within normal limits  OCCULT BLOOD, POC DEVICE - Abnormal; Notable for the following:    Fecal Occult Bld POSITIVE (*)    All other components within normal limits  URINE CULTURE  APTT  TROPONIN I   Imaging Review No results found.  EKG Interpretation   None       MDM  No diagnosis found.  Medications  Barium Sulfate 2.1 % SUSP 900 mL (900 mLs Oral Given 02/12/13 2029)  cefTRIAXone (ROCEPHIN) 1 g in dextrose 5 % 50 mL IVPB (not administered)  sodium chloride 0.9 % bolus 1,000 mL (not administered)  sodium chloride 0.9 % bolus 1,000 mL (1,000 mLs Intravenous New Bag/Given 02/12/13 1924)    Filed Vitals:   02/12/13 2015 02/12/13 2045 02/12/13 2144 02/12/13 2200  BP: 165/90 177/93 163/94 172/94  Pulse: 69 76 80 80  Temp:      TempSrc:      Resp: 24 18 22 19   SpO2: 97%  98% 100%    Patient presenting to the ED with abdominal pain and constipation. Abdominal pain has been ongoing for the past week with no bowel movement within 2 days. Patient reported that the abdominal pain is of a fullness sensation. Wife reported that he has been having black tarry stools.  Alert. GCS 15. Heart rate and rhythm normal. Lungs with decreased breath sounds to upper and lower lobes bilaterally. DP and radial pulses 2+ bilaterally. Negative swelling or pitting edema noted to bilateral lower extremities bilaterally. BS normoactive in all 4 quadrants with guarding upon palpation to all quadrants. Left facial droop that is chronic. Patient unable to move lower extremities secondary to lumbar stenosis - chronic issue. Patient has a permanent cath - changed 3:00PM today.  EKG noted normal sinus rhythm with heart rate of 64 bpm with nonspecific ST elevations to anterior leads. Troponin negative elevation. CBC noted-negative elevation white blood cell  count is actually low upper for count of 3.7. Hemoglobin is 11.7, hematocrit 36.6 - 1 levels are compared to 2 months ago hemoglobin and hematocrit actually improved, hemoglobin has increased from 10.1 to 11.7 and hematocrit increased from 31.1 to 36.6. CMP noted mild hypokalemia of 3.6-patient is a chronic issue of hypokalemia. Liver function identified as being normal. Fecal occult positive. Lipase elevated at 119. Urinalysis noted positive nitrites with large leukocytes, white blood cell count 21-50 with many bacteria. Urine culture pending.  APTT within normal limits. INR 2.72, prothrombin time 27.9-patient on Coumadin.  CT abdomen and pelvis without contrast be performed to rule out possible acute abdominal processes. Patient presenting with UTI - cannot rule out possible pyelonephritis infection - patient has history of recurrent UTIs secondary due to catheter. Patient appears to be dehydrated - IV fluids to be administered. IV antibiotics to be administered for UTI. Patient appears to be neurologically intact - as per baseline, negative new changes noted by family members. Concerned regarding black stools and difficulty swallowing. Patient was able to swallow liquids in ED setting just fine. Discussed case with Antonietta Breach, PA-C at change in shift. Discussed that patient will most likely need to be admitted regarding GI bleed and UTI. Transfer of care to [facility], PA-C at change in shift.   Jamse Mead, PA-C 02/13/13 [place], PA-C 02/13/13 1439

## 2013-02-12 NOTE — ED Notes (Signed)
Per EMS, pt coming from home with abd pain x3-4 days with black tarry stools. EMS reports palpation of abd feels rigid. Pt has foley in place, EMS reports suspect UTI. EMS reports pt a little altered mental per baseline. BP 160/104, HR 86.

## 2013-02-12 NOTE — ED Notes (Signed)
This RN attempted to draw blood for labs twice, and was unsuccessful.

## 2013-02-13 ENCOUNTER — Inpatient Hospital Stay (HOSPITAL_COMMUNITY): Payer: Medicare PPO

## 2013-02-13 DIAGNOSIS — K921 Melena: Principal | ICD-10-CM

## 2013-02-13 DIAGNOSIS — M48061 Spinal stenosis, lumbar region without neurogenic claudication: Secondary | ICD-10-CM

## 2013-02-13 DIAGNOSIS — Z86718 Personal history of other venous thrombosis and embolism: Secondary | ICD-10-CM

## 2013-02-13 DIAGNOSIS — I10 Essential (primary) hypertension: Secondary | ICD-10-CM

## 2013-02-13 DIAGNOSIS — C61 Malignant neoplasm of prostate: Secondary | ICD-10-CM | POA: Diagnosis present

## 2013-02-13 DIAGNOSIS — N312 Flaccid neuropathic bladder, not elsewhere classified: Secondary | ICD-10-CM

## 2013-02-13 DIAGNOSIS — F0390 Unspecified dementia without behavioral disturbance: Secondary | ICD-10-CM

## 2013-02-13 DIAGNOSIS — N390 Urinary tract infection, site not specified: Secondary | ICD-10-CM

## 2013-02-13 DIAGNOSIS — R109 Unspecified abdominal pain: Secondary | ICD-10-CM

## 2013-02-13 LAB — CBC WITH DIFFERENTIAL/PLATELET
BASOS ABS: 0 10*3/uL (ref 0.0–0.1)
BASOS PCT: 0 % (ref 0–1)
EOS ABS: 0 10*3/uL (ref 0.0–0.7)
Eosinophils Relative: 1 % (ref 0–5)
HCT: 34.9 % — ABNORMAL LOW (ref 39.0–52.0)
Hemoglobin: 11.2 g/dL — ABNORMAL LOW (ref 13.0–17.0)
Lymphocytes Relative: 23 % (ref 12–46)
Lymphs Abs: 1 10*3/uL (ref 0.7–4.0)
MCH: 29.8 pg (ref 26.0–34.0)
MCHC: 32.1 g/dL (ref 30.0–36.0)
MCV: 92.8 fL (ref 78.0–100.0)
Monocytes Absolute: 0.5 10*3/uL (ref 0.1–1.0)
Monocytes Relative: 11 % (ref 3–12)
Neutro Abs: 2.9 10*3/uL (ref 1.7–7.7)
Neutrophils Relative %: 65 % (ref 43–77)
PLATELETS: 199 10*3/uL (ref 150–400)
RBC: 3.76 MIL/uL — ABNORMAL LOW (ref 4.22–5.81)
RDW: 14.4 % (ref 11.5–15.5)
WBC: 4.4 10*3/uL (ref 4.0–10.5)

## 2013-02-13 LAB — PROTIME-INR
INR: 2.71 — ABNORMAL HIGH (ref 0.00–1.49)
Prothrombin Time: 27.8 seconds — ABNORMAL HIGH (ref 11.6–15.2)

## 2013-02-13 LAB — APTT: aPTT: 40 seconds — ABNORMAL HIGH (ref 24–37)

## 2013-02-13 LAB — COMPREHENSIVE METABOLIC PANEL
ALBUMIN: 3.6 g/dL (ref 3.5–5.2)
ALK PHOS: 49 U/L (ref 39–117)
ALT: 9 U/L (ref 0–53)
AST: 16 U/L (ref 0–37)
BUN: 14 mg/dL (ref 6–23)
CO2: 30 mEq/L (ref 19–32)
Calcium: 8.9 mg/dL (ref 8.4–10.5)
Chloride: 101 mEq/L (ref 96–112)
Creatinine, Ser: 1.01 mg/dL (ref 0.50–1.35)
GFR calc Af Amer: 75 mL/min — ABNORMAL LOW (ref >90)
GFR calc non Af Amer: 65 mL/min — ABNORMAL LOW (ref >90)
Glucose, Bld: 82 mg/dL (ref 70–99)
POTASSIUM: 3 meq/L — AB (ref 3.7–5.3)
Sodium: 144 mEq/L (ref 137–147)
Total Bilirubin: 0.3 mg/dL (ref 0.3–1.2)
Total Protein: 6.5 g/dL (ref 6.0–8.3)

## 2013-02-13 LAB — CG4 I-STAT (LACTIC ACID): Lactic Acid, Venous: 1.04 mmol/L (ref 0.5–2.2)

## 2013-02-13 LAB — TROPONIN I

## 2013-02-13 MED ORDER — DONEPEZIL HCL 10 MG PO TABS
10.0000 mg | ORAL_TABLET | Freq: Every day | ORAL | Status: DC
  Administered 2013-02-13 – 2013-02-15 (×3): 10 mg via ORAL
  Filled 2013-02-13 (×4): qty 1

## 2013-02-13 MED ORDER — SODIUM CHLORIDE 0.9 % IJ SOLN
3.0000 mL | Freq: Two times a day (BID) | INTRAMUSCULAR | Status: DC
  Administered 2013-02-13 – 2013-02-15 (×6): 3 mL via INTRAVENOUS

## 2013-02-13 MED ORDER — FERROUS SULFATE 324 (65 FE) MG PO TBEC: 1.0000 | DELAYED_RELEASE_TABLET | Freq: Two times a day (BID) | ORAL | Status: DC

## 2013-02-13 MED ORDER — PANTOPRAZOLE SODIUM 40 MG IV SOLR
80.0000 mg | Freq: Once | INTRAVENOUS | Status: AC
  Administered 2013-02-13: 80 mg via INTRAVENOUS
  Filled 2013-02-13: qty 80

## 2013-02-13 MED ORDER — ONDANSETRON HCL 4 MG PO TABS: 4.0000 mg | ORAL_TABLET | Freq: Four times a day (QID) | ORAL | Status: DC | PRN

## 2013-02-13 MED ORDER — LORAZEPAM 2 MG/ML IJ SOLN: 0.5000 mg | Freq: Once | INTRAMUSCULAR | Status: DC

## 2013-02-13 MED ORDER — DEXTROSE 5 % IV SOLN
1.0000 g | INTRAVENOUS | Status: DC
  Administered 2013-02-13: 1 g via INTRAVENOUS
  Filled 2013-02-13 (×2): qty 10

## 2013-02-13 MED ORDER — TAMSULOSIN HCL 0.4 MG PO CAPS
0.8000 mg | ORAL_CAPSULE | Freq: Every day | ORAL | Status: DC
  Administered 2013-02-13 – 2013-02-15 (×4): 0.8 mg via ORAL
  Filled 2013-02-13 (×5): qty 2

## 2013-02-13 MED ORDER — ATENOLOL 50 MG PO TABS
50.0000 mg | ORAL_TABLET | Freq: Every morning | ORAL | Status: DC
  Administered 2013-02-13 – 2013-02-16 (×4): 50 mg via ORAL
  Filled 2013-02-13 (×4): qty 1

## 2013-02-13 MED ORDER — AMLODIPINE BESYLATE 10 MG PO TABS
10.0000 mg | ORAL_TABLET | Freq: Every day | ORAL | Status: DC
  Administered 2013-02-13 – 2013-02-16 (×4): 10 mg via ORAL
  Filled 2013-02-13 (×4): qty 1

## 2013-02-13 MED ORDER — ONDANSETRON HCL 4 MG/2ML IJ SOLN: 4.0000 mg | Freq: Four times a day (QID) | INTRAMUSCULAR | Status: DC | PRN

## 2013-02-13 MED ORDER — POTASSIUM CHLORIDE 10 MEQ/100ML IV SOLN
10.0000 meq | INTRAVENOUS | Status: AC
  Administered 2013-02-13 (×3): 10 meq via INTRAVENOUS
  Filled 2013-02-13 (×3): qty 100

## 2013-02-13 MED ORDER — PANTOPRAZOLE SODIUM 40 MG IV SOLR
40.0000 mg | Freq: Two times a day (BID) | INTRAVENOUS | Status: DC
  Administered 2013-02-13 – 2013-02-15 (×6): 40 mg via INTRAVENOUS
  Filled 2013-02-13 (×9): qty 40

## 2013-02-13 MED ORDER — ALPRAZOLAM 0.5 MG PO TABS
0.5000 mg | ORAL_TABLET | Freq: Three times a day (TID) | ORAL | Status: DC | PRN
  Filled 2013-02-13 (×2): qty 1

## 2013-02-13 MED ORDER — AMILORIDE HCL 5 MG PO TABS
10.0000 mg | ORAL_TABLET | Freq: Every day | ORAL | Status: DC
  Administered 2013-02-13 – 2013-02-16 (×4): 10 mg via ORAL
  Filled 2013-02-13 (×4): qty 2

## 2013-02-13 MED ORDER — CITALOPRAM HYDROBROMIDE 20 MG PO TABS
20.0000 mg | ORAL_TABLET | Freq: Every day | ORAL | Status: DC
  Administered 2013-02-13 – 2013-02-15 (×3): 20 mg via ORAL
  Filled 2013-02-13 (×4): qty 1

## 2013-02-13 MED ORDER — FERROUS SULFATE 325 (65 FE) MG PO TABS
325.0000 mg | ORAL_TABLET | Freq: Two times a day (BID) | ORAL | Status: DC
  Administered 2013-02-13 – 2013-02-15 (×6): 325 mg via ORAL
  Filled 2013-02-13 (×10): qty 1

## 2013-02-13 NOTE — ED Notes (Signed)
Attempted report x 3. No answer.

## 2013-02-13 NOTE — H&P (Addendum)
[facility] History and Physical  Patient: Joshua Rodgers  ZES:[id number]  DOB: 1923-09-21  DOS: the patient was seen and examined on 02/13/2013 PCP: Katy Apo, MD  Chief Complaint: Black color bowel movements  HPI: Joshua Rodgers is a 80 y.o. 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 The patient is coming from home. The patient presented with complaints of abdominal pain and  Back stool.as per the patient he has chronic abdominal pain which is present since last many months and he also has black color bowel movement which is present since last year. He also reported on and off constipation. The history has been obtained from both wife as well as patient and as per the wife the patient also has difficulty swallowing and at times has episodes of aspiration. They mention that sometimes the food was stuck at the throat level and sometime at the stomach level. He denies any acid reflux but complains of epigastric pain which feels like crampy in nature. He also denies any nausea or vomiting. He denies any bleeding anywhere else. There is no complaint of shortness of breath or chest pain. He has some cough with clear sputum and recently he had some sore throat which is getting better. He denies any fever or chills. There is no recent change in his medications. He is on Coumadin chronically for his DVT that was diagnosed back in 2013 and has been recommended to stay on it lifelong due to near fatal pulmonary embolism. At his baseline is not fairly active and mostly wheelchair-bound.   Review of Systems: as mentioned in the history of present illness.  A Comprehensive review of the other systems is negative.  Past Medical History  Diagnosis Date  . Generalized  weakness   . Hypokalemia   . Renal disorder   . Hyperlipidemia   . Vitamin D deficiency   . Bell's palsy   . Lumbar stenosis   . Hypertension   . Depression   . Dementia   . Prostate cancer ~ 2004    "had radiation tx" (11/24/2012)  . DVT (deep venous thrombosis)     "he's had them in both legs" (11/24/2012)  . Asthma     "as a child, real bad" (11/24/2012)  . History of bleeding ulcers ~ 1956    "in hospital for ~ 1 month" (11/24/2012)  . Recurrent UTI (urinary tract infection) 2013-now    "lots" (11/24/2012)  . DJD (degenerative joint disease)   . Arthritis     "joints" (11/24/2012)  . Anxiety    Past Surgical History  Procedure Laterality Date  . Total knee arthroplasty Left 02/2010  . Back surgery  2008    "for stenosis" (11/24/2012)   Social History:  reports that he has quit smoking. His smoking use included Cigarettes. He smoked 0.00 packs per day for 10 years. He has never used smokeless tobacco. He reports that he does not drink alcohol or use illicit drugs. Independent for most of his  ADL.  Allergies  Allergen Reactions  . Iohexol      Desc: sob, throat swelling (1988 gdc) pt requires full premeds and does well, JB 8/14/7     History reviewed. No pertinent family history.  Prior to Admission medications   Medication Sig Start Date End Date Taking? Authorizing Provider  ALPRAZolam (XANAX) 0.5 MG tablet Take 0.5 mg by mouth 3 (three) times daily as needed for anxiety. For anxiety.   Yes Historical Provider, MD  aMILoride (MIDAMOR) 5 MG tablet Take 10 mg by mouth daily.   Yes Historical Provider, MD  amLODipine (NORVASC) 10 MG tablet Take 10 mg by mouth daily.   Yes Historical Provider, MD  atenolol (TENORMIN) 50 MG tablet Take 50 mg by mouth every morning.   Yes Historical Provider, MD  Calcium-Vitamin D (CALTRATE 600 PLUS-VIT D PO) Take 1 tablet by mouth 2 (two) times daily.   Yes Historical Provider, MD  cholecalciferol (VITAMIN D) 1000 UNITS tablet Take  1,000 Units by mouth daily.   Yes Historical Provider, MD  citalopram (CELEXA) 20 MG tablet Take 20 mg by mouth daily. 11/11/12  Yes Historical Provider, MD  Cyanocobalamin (VITAMIN B 12 PO) Take 1,000 mg by mouth daily.   Yes Historical Provider, MD  donepezil (ARICEPT) 10 MG tablet Take 10 mg by mouth daily. 08/03/12  Yes Marcial Pacas, MD  ferrous sulfate 324 (65 FE) MG TBEC Take 1 tablet by mouth 2 (two) times daily.   Yes Historical Provider, MD  Glucosamine-Chondroit-Vit C-Mn (GLUCOSAMINE CHONDR 1500 COMPLX) CAPS Take 1 capsule by mouth daily.   Yes Historical Provider, MD  potassium chloride SA (K-DUR,KLOR-CON) 20 MEQ tablet Take 20 mEq by mouth 2 (two) times daily.   Yes Historical Provider, MD  Tamsulosin HCl (FLOMAX) 0.4 MG CAPS Take 0.8 mg by mouth at bedtime.   Yes Historical Provider, MD  warfarin (COUMADIN) 2 MG tablet Take 1-2 mg by mouth daily. Take 1/2 tablet (1mg ) on saturdays and sundays all other days take 1tablet (2mg )   Yes Historical Provider, MD    Physical Exam: Filed Vitals:   02/12/13 2309 02/13/13 0000 02/13/13 0045 02/13/13 0100  BP: 151/93 163/80 173/90 177/93  Pulse: 75 72 70 72  Temp:      TempSrc:      Resp: 25 23 21 24   SpO2: 97% 99% 97% 96%    General: Alert, Awake and Oriented to Time, Place and Person. Appear in moderate distress Eyes: PERRL ENT: Oral Mucosa clear moist. Neck:  no JVD Cardiovascular: S1 and S2 Present,  aortic systolic Murmur, Peripheral Pulses Present Respiratory: Bilateral Air entry equal and Decreased, Clear to Auscultation,   no Crackles, no wheezes Abdomen: Bowel Sound Present, Soft and diffuse minimal  tender more in epigastric region no guarding no rigidity  Skin:  no Rash Extremities:  right leg Pedal edema,  no calf tenderness Neurologic: Grossly Unremarkable. Labs on Admission:  CBC:  Recent Labs Lab 02/12/13 1920  WBC 3.7*  NEUTROABS 2.4  HGB 11.7*  HCT 36.6*  MCV 92.9  PLT 198    CMP     Component Value  Date/Time   NA 143 02/12/2013 1920   K 3.6* 02/12/2013 1920   CL 101 02/12/2013 1920   CO2 30 02/12/2013 1920   GLUCOSE 112* 02/12/2013 1920   BUN 19 02/12/2013 1920   CREATININE 1.18 02/12/2013 1920   CALCIUM 9.6 02/12/2013 1920   PROT 6.8 02/12/2013 1920   ALBUMIN 3.7 02/12/2013 1920   AST 18 02/12/2013 1920   ALT 10 02/12/2013 1920   ALKPHOS 54 02/12/2013 1920   BILITOT 0.3 02/12/2013 1920   GFRNONAA 54* 02/12/2013 1920   GFRAA 62* 02/12/2013 1920     Recent Labs Lab 02/12/13 1920  LIPASE 119*   No results found for this basename: AMMONIA,  in the last 168 hours   Recent Labs Lab 02/12/13 1920 02/13/13 0006  TROPONINI <0.30 <0.30   BNP (last 3 results) No results found for this basename: PROBNP,  in the last 8760 hours  Radiological Exams on Admission: Ct Abdomen Pelvis Wo Contrast  02/12/2013   CLINICAL DATA:  Black tarry stools, rigid abdomen. Suspect urinary tract infection. Status post left renal tumor ablation.  EXAM: CT ABDOMEN AND PELVIS WITHOUT CONTRAST  TECHNIQUE: Multidetector CT imaging of the abdomen and pelvis was performed following the standard protocol without intravenous contrast.  COMPARISON:  CT of the abdomen April 15, 2007  FINDINGS: Included view of the lung bases are clear though, there is mild respiratory motion. Heart size is normal. Mild aortic annular calcifications. The included pericardium is unremarkable.  Small hiatal hernia. The stomach, small and large bowel are normal in course and caliber without inflammatory changes. Normal appendix. There may be a small component of rectal prolapse. No intraperitoneal free fluid nor free air.  The liver, spleen, pancreas, gallbladder are unremarkable for this noncontrast examination. 22 x 25 mm left adrenal nodule (9 Hounsfield units), 11 x 13 mm right adrenal nodule (-14 Hounsfield units) both of which are representative of benign adrenal adenomas.  2 mm nonobstructing right lower pole renal calculus associated with mild scarring. No  hydronephrosis. No left nephrolithiasis. 10 mm cyst in right interpolar kidney is unchanged. Ureters are normal in course and caliber. Eccentric posterior urinary bladder wall thickening measures 11 mm.  Great vessels are overall normal in course and caliber with moderate calcific atherosclerosis. Small right fat containing inguinal hernia. Left pelvic iliopsoas muscle intramuscular lipoma. Moderate fat containing umbilical hernia. Severe L5-S1 degenerative disc disease. Status post L4-5 laminectomies.  IMPRESSION: Eccentric posterior urinary bladder wall thickening, and of this could reflect cystitis, tumor could have a similar appearance. Consider cystoscopy upon resolution of acute symptoms.  No acute intra-abdominal or pelvic process.  Right lower pole renal scarring associated with a nonobstructing 2 mm calculus.   Electronically Signed   By: Elon Alas   On: 02/12/2013 23:22    Assessment/Plan Principal Problem:   Melena Active Problems:   Lumbar stenosis   Dementia   HTN (hypertension)   History of DVT (deep vein thrombosis)   UTI (lower urinary tract infection)   Prostate cancer   1. Melena The patient is presenting with complaints of black color bowel movements that have been ongoing since last few months at least. He reported that they are soft and loose and he has on and off constipation and regular bowel movements. Since last few weeks he has been complaining of some epigastric pain and difficulty swallowing. With this he also has a history of bleeding ulcer but it is unclear whether he had any workup done for that or not it At present his CT scan does not show any significant acute abnormality in his GI tract. He is hemodynamically stable. His hemoglobin is stable. He has positive Hemoccult. I will put him on Protonix IV, keep him n.p.o. Consult GI, follow serial H&H. since he has history of life-threatening DVT and has been recommended to stay on Coumadin lifelong I would  hold 1 dose of Coumadin but would not reverse his INR acutely at present since his hemoglobin is stable if he has further bowel movements with blood or hemoglobin trending downwards at that time I will reverse his warfarin.  2. history of DVT At present holding Coumadin and not reversing INR in view  of life-threatening PE history  3. Possible cystitis and history of prostate cancer with radiation At present I would treat him with IV ceftriaxone but this may be chronic changes due to radiation  4.Difficulty swallowing food   I will keep him n.p.o. and obtain a speech evaluation GI consult was also be needed for this  6. Hypertension At present patient has accelerated hypertension I would continue his home antihypertensive medications  7 dementia Stable continue Aricept  8 lumbar stenosis Patient at his baseline does not ambulate PTOT consult   9. elevated lipase Patient does presents with complaints of abdominal pain which is in epigastric region but a CT scan does not suggest any pancreatitis. He does have elevated lipase. Keep n.p.o. IV Protonix and IV Zofran until pain subsides.  10. Bilateral adrenal nodule Incidental finding may require followup  Consults: Gastroenterology   DVT Prophylaxis: mechanical compression device Nutrition: N.p.o.   Code Status: Full   Family Communication: Family  was present at bedside, opportunity was given to ask question and all questions were answered satisfactorily at the time of interview. Disposition: Admitted to inpatient in telemetry unit.  Author: Berle Mull, MD [facility] Pager: [phone] 02/13/2013, 1:52 AM    If 7PM-7AM, please contact night-coverage [email or web address] Password [email or web address]

## 2013-02-13 NOTE — ED Notes (Signed)
Patel, MD at bedside.  

## 2013-02-13 NOTE — Consult Note (Signed)
[facility] Gastroenterology Consultation Note  Referring Provider: Dr. Renford Dills [facility] Patsi Sears) Primary Care Physician:  Katy Apo, MD  Reason for Consultation:  Dark stools, anemia, hemoccult-positive stool  HPI: Joshua Rodgers is a 80 y.o. male whom we've been asked to see for above symptoms.  Patient has history of chronic dementia, and while he has some troubles with comprehension of some questions, he is able to provide a history for me.  He describes having 2-3 black stools per day for the past couple months.  He is on iron, and I questioned him whether his stools are always dark on oral iron, and he's not sure.  Hgb is around 12, which is near (or a little higher than) his chronic baseline, dating back for a few years, upon my review of his outpatient chart.  Patient told primary team he was having dysphagia, but denies this to me.  Speech therapy has already seen the patient and recommends regular diet with thin liquids.  Patient denies abdominal pain, hematemesis, hematochezia.  Patient denies ever having had an endoscopy or colonoscopy, and I can not locate any endoscopic report either here on [facility] or on our [facility] outpatient EMR.  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  Has been taking increase in NSAIDs (naproxen) for pain of late.   Past Medical History  Diagnosis Date  . Generalized weakness   . Hypokalemia   . Renal disorder   . Hyperlipidemia   . Vitamin D deficiency   . Bell's palsy   . Lumbar stenosis   . Hypertension   . Depression   . Dementia   . Prostate cancer ~ 2004    "had radiation tx" (11/24/2012)  . DVT (deep venous thrombosis)     "he's had them in both legs" (11/24/2012)  . Asthma     "as a child, real bad" (11/24/2012)  . History of bleeding ulcers ~ 1956    "in hospital for ~ 1 month" (11/24/2012)  . Recurrent UTI (urinary tract infection) 2013-now    "lots" (11/24/2012)  . DJD (degenerative joint  disease)   . Arthritis     "joints" (11/24/2012)  . Anxiety     Past Surgical History  Procedure Laterality Date  . Total knee arthroplasty Left 02/2010  . Back surgery  2008    "for stenosis" (11/24/2012)    Prior to Admission medications   Medication Sig Start Date End Date Taking? Authorizing Provider  ALPRAZolam Prudy Feeler) 0.5 MG tablet Take 0.5 mg by mouth 3 (three) times daily as needed for anxiety. For anxiety.   Yes Historical Provider, MD  aMILoride (MIDAMOR) 5 MG tablet Take 10 mg by mouth daily.   Yes Historical Provider, MD  amLODipine (NORVASC) 10 MG tablet Take 10 mg by mouth daily.   Yes Historical Provider, MD  atenolol (TENORMIN) 50 MG tablet Take 50 mg by mouth every morning.   Yes Historical Provider, MD  Calcium-Vitamin D (CALTRATE 600 PLUS-VIT D PO) Take 1 tablet by mouth 2 (two) times daily.   Yes Historical Provider, MD  cholecalciferol (VITAMIN D) 1000 UNITS tablet Take 1,000 Units by mouth daily.   Yes Historical Provider, MD  citalopram (CELEXA) 20 MG tablet Take 20 mg by mouth daily. 11/11/12  Yes Historical Provider, MD  Cyanocobalamin (VITAMIN B 12 PO) Take 1,000 mg by mouth daily.   Yes Historical Provider, MD  donepezil (ARICEPT) 10 MG tablet Take 10 mg by mouth daily. 08/03/12  Yes Marcial Pacas, MD  ferrous sulfate 324 (65 FE) MG TBEC Take 1 tablet by mouth 2 (two) times daily.   Yes Historical Provider, MD  Glucosamine-Chondroit-Vit C-Mn (GLUCOSAMINE CHONDR 1500 COMPLX) CAPS Take 1 capsule by mouth daily.   Yes Historical Provider, MD  potassium chloride SA (K-DUR,KLOR-CON) 20 MEQ tablet Take 20 mEq by mouth 2 (two) times daily.   Yes Historical Provider, MD  Tamsulosin HCl (FLOMAX) 0.4 MG CAPS Take 0.8 mg by mouth at bedtime.   Yes Historical Provider, MD  warfarin (COUMADIN) 2 MG tablet Take 1-2 mg by mouth daily. Take 1/2 tablet (1mg ) on saturdays and sundays all other days take 1tablet (2mg )   Yes Historical Provider, MD    Current Facility-Administered  Medications  Medication Dose Route Frequency Provider Last Rate Last Dose  . ALPRAZolam (XANAX) tablet 0.5 mg  0.5 mg Oral TID PRN Berle Mull, MD      . aMILoride (MIDAMOR) tablet 10 mg  10 mg Oral Daily Berle Mull, MD      . amLODipine (NORVASC) tablet 10 mg  10 mg Oral Daily Berle Mull, MD      . atenolol (TENORMIN) tablet 50 mg  50 mg Oral q morning - 10a Berle Mull, MD      . cefTRIAXone (ROCEPHIN) 1 g in dextrose 5 % 50 mL IVPB  1 g Intravenous Q24H Berle Mull, MD      . citalopram (CELEXA) tablet 20 mg  20 mg Oral Daily Berle Mull, MD      . donepezil (ARICEPT) tablet 10 mg  10 mg Oral Daily Berle Mull, MD      . ferrous sulfate tablet 325 mg  325 mg Oral BID WC Berle Mull, MD      . LORazepam (ATIVAN) injection 0.5 mg  0.5 mg Intravenous Once Dianne Dun, NP      . ondansetron [facility]) tablet 4 mg  4 mg Oral Q6H PRN Berle Mull, MD       Or  . ondansetron (ZOFRAN) injection 4 mg  4 mg Intravenous Q6H PRN Berle Mull, MD      . pantoprazole (PROTONIX) injection 40 mg  40 mg Intravenous Q12H Berle Mull, MD      . potassium chloride 10 mEq in 100 mL IVPB  10 mEq Intravenous Q1 Hr x 3 Kandice Hams, MD      . sodium chloride 0.9 % injection 3 mL  3 mL Intravenous Q12H Berle Mull, MD      . tamsulosin (FLOMAX) capsule 0.8 mg  0.8 mg Oral QHS Berle Mull, MD   0.8 mg at 02/13/13 0326    Allergies as of 02/12/2013 - Review Complete 02/12/2013  Allergen Reaction Noted  . Iohexol  12/17/2004    History reviewed. No pertinent family history.  History   Social History  . Marital Status: Married    Spouse Name: N/A    Number of Children: N/A  . Years of Education: N/A   Occupational History  . Not on file.   Social History Main Topics  . Smoking status: Former Smoker -- 10 years    Types: Cigarettes  . Smokeless tobacco: Never Used     Comment: 11/24/2012 "quit smoking cigarettes in ~ 1956"  . Alcohol Use: No  . Drug Use: No  . Sexual  Activity: No   Other Topics Concern  . Not on file   Social History Narrative  . No narrative on file    Review  of Systems: Grossly normal but limited due to his troubles with chronic dementia  Physical Exam: Vital signs in last 24 hours: Temp:  [98.1 F (36.7 C)-98.5 F (36.9 C)] 98.4 F (36.9 C) (02/02 0613) Pulse Rate:  [69-81] 80 (02/02 0613) Resp:  [18-25] 18 (02/02 0613) BP: (122-184)/(80-102) 157/92 mmHg (02/02 0613) SpO2:  [95 %-100 %] 95 % (02/02 0613) Weight:  [85.957 kg (189 lb 8 oz)] 85.957 kg (189 lb 8 oz) (02/02 0203) Last BM Date: 02/13/13 General:   Alert,  Well-developed, well-nourished, pleasant and cooperative in NAD Head:  Normocephalic and atraumatic. Eyes:  Sclera clear, no icterus.   Conjunctiva pink. Ears:  Normal auditory acuity. Nose:  No deformity, discharge,  or lesions. Mouth:  No deformity or lesions.  Oropharynx pink & moist. Neck:  Supple; no masses or thyromegaly. Lungs:  Clear throughout to auscultation.   No wheezes, crackles, or rhonchi. No acute distress. Heart:  Regular rate and rhythm; II/VI SEM LUSB with radiation to apex Abdomen:  Soft, nontender and nondistended. No masses, hepatosplenomegaly or hernias noted. Normal bowel sounds, without guarding, and without rebound.     Msk:  Symmetrical without gross deformities. Normal posture. Extremities:  Without clubbing or edema. Neurologic:  Alert to person and place, but does seem to have some troubles with memory and comprehension of questions;  Otherwise grossly normal neurologically. Skin:  Intact without significant lesions or rashes. Psych:  Alert and cooperative. Normal mood and affect.   Lab Results:  Recent Labs  02/12/13 1920 02/13/13 0400  WBC 3.7* 4.4  HGB 11.7* 11.2*  HCT 36.6* 34.9*  PLT 198 199   BMET  Recent Labs  02/12/13 1920 02/13/13 0400  NA 143 144  K 3.6* 3.0*  CL 101 101  CO2 30 30  GLUCOSE 112* 82  BUN 19 14  CREATININE 1.18 1.01  CALCIUM 9.6  8.9   LFT  Recent Labs  02/13/13 0400  PROT 6.5  ALBUMIN 3.6  AST 16  ALT 9  ALKPHOS 49  BILITOT 0.3   PT/INR  Recent Labs  02/12/13 1920 02/13/13 0400  LABPROT 27.9* 27.8*  INR 2.72* 2.71*    Studies/Results: Ct Abdomen Pelvis Wo Contrast  02/12/2013   CLINICAL DATA:  Black tarry stools, rigid abdomen. Suspect urinary tract infection. Status post left renal tumor ablation.  EXAM: CT ABDOMEN AND PELVIS WITHOUT CONTRAST  TECHNIQUE: Multidetector CT imaging of the abdomen and pelvis was performed following the standard protocol without intravenous contrast.  COMPARISON:  CT of the abdomen April 15, 2007  FINDINGS: Included view of the lung bases are clear though, there is mild respiratory motion. Heart size is normal. Mild aortic annular calcifications. The included pericardium is unremarkable.  Small hiatal hernia. The stomach, small and large bowel are normal in course and caliber without inflammatory changes. Normal appendix. There may be a small component of rectal prolapse. No intraperitoneal free fluid nor free air.  The liver, spleen, pancreas, gallbladder are unremarkable for this noncontrast examination. 22 x 25 mm left adrenal nodule (9 Hounsfield units), 11 x 13 mm right adrenal nodule (-14 Hounsfield units) both of which are representative of benign adrenal adenomas.  2 mm nonobstructing right lower pole renal calculus associated with mild scarring. No hydronephrosis. No left nephrolithiasis. 10 mm cyst in right interpolar kidney is unchanged. Ureters are normal in course and caliber. Eccentric posterior urinary bladder wall thickening measures 11 mm.  Great vessels are overall normal in course and caliber with moderate  calcific atherosclerosis. Small right fat containing inguinal hernia. Left pelvic iliopsoas muscle intramuscular lipoma. Moderate fat containing umbilical hernia. Severe L5-S1 degenerative disc disease. Status post L4-5 laminectomies.  IMPRESSION: Eccentric posterior  urinary bladder wall thickening, and of this could reflect cystitis, tumor could have a similar appearance. Consider cystoscopy upon resolution of acute symptoms.  No acute intra-abdominal or pelvic process.  Right lower pole renal scarring associated with a nonobstructing 2 mm calculus.   Electronically Signed   By: Elon Alas   On: 02/12/2013 23:22   Dg Chest 2 View  02/13/2013   CLINICAL DATA:  Aspiration.  EXAM: CHEST  2 VIEW  COMPARISON:  Chest radiograph November 22, 2012  FINDINGS: The cardiac silhouette remains limits of normal in size, mediastinal silhouette is nonsuspicious. Both lungs are clear. Elevated left hemidiaphragm. No pneumothorax. The visualized skeletal structures are unremarkable. Multiple EKG lines overlie the patient and may obscure subtle underlying pathology. Mild degenerative change of the thoracic spine.  IMPRESSION: No acute cardiopulmonary process.   Electronically Signed   By: Elon Alas   On: 02/13/2013 02:57   Impression:  1.  Black stools.  Hemoccult-positive.  Perhaps iron-related. 2.  Anemia.  Appears near baseline level, dating back at least a couple years. 3.  Dysphagia by report.  Patient denies dysphagia when I question him; might be some troubles with comprehension.  Speech therapy has seen patient and recommends regular diet with thin liquids.  Plan:  1.  Given patient's supposed lifelong need for warfarin, and his possible history of melena (versus simply dark stools from oral iron), would suggest endoscopy for further evaluation.  Will need INR to drift down to </= 1.8 before we can safely do his endoscopy.  Will probably be in a couple days (current INR 2.7). 2.  Will need to discuss with wife or other family member to obtain procedural consent.  I have called home and cell numbers for patient's wife, Zaavan Sipp, with no answer, and I left her a message on her home phone answering machine. 3.  PPI and follow CBC for the time-being. 4.  Will  follow; thank you for the consult.   LOS: 1 day   Tymere Depuy M  02/13/2013, 11:01 AM

## 2013-02-13 NOTE — Progress Notes (Signed)
Utilization review completed.  

## 2013-02-13 NOTE — Evaluation (Signed)
Clinical/Bedside Swallow Evaluation Patient Details  Name: Joshua Rodgers MRN: [id number] Date of Birth: December 23, 1923  Today's Date: 02/13/2013 Time: 3382-5053 SLP Time Calculation (min): 15 min  Past Medical History:  Past Medical History  Diagnosis Date  . Generalized weakness   . Hypokalemia   . Renal disorder   . Hyperlipidemia   . Vitamin D deficiency   . Bell's palsy   . Lumbar stenosis   . Hypertension   . Depression   . Dementia   . Prostate cancer ~ 2004    "had radiation tx" (11/24/2012)  . DVT (deep venous thrombosis)     "he's had them in both legs" (11/24/2012)  . Asthma     "as a child, real bad" (11/24/2012)  . History of bleeding ulcers ~ 1956    "in hospital for ~ 1 month" (11/24/2012)  . Recurrent UTI (urinary tract infection) 2013-now    "lots" (11/24/2012)  . DJD (degenerative joint disease)   . Arthritis     "joints" (11/24/2012)  . Anxiety    Past Surgical History:  Past Surgical History  Procedure Laterality Date  . Total knee arthroplasty Left 02/2010  . Back surgery  2008    "for stenosis" (11/24/2012)   HPI:  80 year old male with PMH of dementia, HTN, prostate cancer, Bells palsy, asthma, spinal stenosis, UTI, presenting with dark stools and c/o new onset dysphagia characterized by c/o food getting stuck in throat. MBS 01/09/11 indicated normal oropharyngeal swallowing function, possible cervical osteophytes C4-7 which did not impact overall function and recommendations for a regular diet.    Assessment / Plan / Recommendation Clinical Impression  Patient presents with a functional oropharyngeal swallow without overt indication of aspiration. Wife reports c/o globus with pos however patient did not confirm during today's exam. Audible swallow noted suggestive of anatomical variation consistent with previous MBS results. Globus may be a result of suspected osteophytes along cervical spine as well vs esophageal deficit. Education complete with  patient and wife regarding general safe swallowing and esophageal precautions. No SLP f/u indicated. Defer further esophageal w/u to MD if feels necessary.    Aspiration Risk  Mild    Diet Recommendation Regular;Thin liquid   Liquid Administration via: Cup;Straw Medication Administration: Whole meds with liquid Supervision: Patient able to self feed;Intermittent supervision to cue for compensatory strategies Compensations: Slow rate;Small sips/bites;Follow solids with liquid Postural Changes and/or Swallow Maneuvers: Seated upright 90 degrees;Upright 30-60 min after meal    Other  Recommendations Oral Care Recommendations: Oral care BID   Follow Up Recommendations  None    Frequency and Duration        Pertinent Vitals/Pain n/a        Swallow Study    General HPI: 80 year old male with PMH of dementia, HTN, prostate cancer, Bells palsy, asthma, spinal stenosis, UTI, presenting with dark stools and c/o new onset dysphagia characterized by c/o food getting stuck in throat. MBS 01/09/11 indicated normal oropharyngeal swallowing function, possible cervical osteophytes C4-7 which did not impact overall function and recommendations for a regular diet.  Type of Study: Bedside swallow evaluation Previous Swallow Assessment: see HPI Diet Prior to this Study: NPO Temperature Spikes Noted: No Respiratory Status: Room air History of Recent Intubation: No Behavior/Cognition: Alert;Cooperative;Pleasant mood Oral Cavity - Dentition: Adequate natural dentition Self-Feeding Abilities: Able to feed self Patient Positioning: Upright in bed Baseline Vocal Quality: Clear Volitional Cough: Strong Volitional Swallow: Able to elicit    Oral/Motor/Sensory Function Overall Oral Motor/Sensory  Function: Appears within functional limits for tasks assessed   Ice Chips Ice chips: Not tested   Thin Liquid Thin Liquid: Within functional limits Presentation: Cup;Self Fed;Straw    Nectar Thick Nectar  Thick Liquid: Not tested   Honey Thick Honey Thick Liquid: Not tested   Puree Puree: Within functional limits Presentation: Spoon   Solid   GO   Joshua Lafontant MA, CCC-SLP [phone]  Solid: Within functional limits Presentation: Self Fed       Joshua Rodgers Joshua Rodgers 02/13/2013,9:50 AM

## 2013-02-13 NOTE — Progress Notes (Signed)
INITIAL NUTRITION ASSESSMENT  DOCUMENTATION CODES Per approved criteria  -Not Applicable   INTERVENTION:  Recommend diet advancement per SLP to regular with thin liquids.  RD to add PO supplements as needed based on adequacy of oral intake.  NUTRITION DIAGNOSIS: Inadequate oral intake related to difficulty swallowing at home as evidenced by patient/wife report.   Goal: Intake to meet >90% of estimated nutrition needs.  Monitor:  PO intake, labs, weight trend.  Reason for Assessment: MST  80 y.o. male  Admitting Dx: Melena  ASSESSMENT: Patient is a 80 year old male with PMH of dementia, HTN, prostate cancer, Bells palsy, asthma, spinal stenosis, UTI, presenting with dark stools and c/o new onset dysphagia characterized by c/o food getting stuck in throat.   Patient and his wife report that he has lost a little weight. PO intake has decreased over the past few months. He eats 3 meals a day, but smaller amounts than he used to eat. C/O food getting stuck in his throat. S/P bedside swallow evaluation with SLP this morning, SLP recommends regular diet with thin liquids. Nutrition focused physical exam completed.  No muscle or subcutaneous fat depletion noticed.   Height: Ht Readings from Last 1 Encounters:  02/13/13 5\' 11"  (1.803 m)    Weight: Wt Readings from Last 1 Encounters:  02/13/13 189 lb 8 oz (85.957 kg)    Ideal Body Weight: 78.2 kg  % Ideal Body Weight: 110%  Wt Readings from Last 10 Encounters:  02/13/13 189 lb 8 oz (85.957 kg)  11/25/12 202 lb 2.6 oz (91.7 kg)  06/18/11 190 lb (86.183 kg)    Usual Body Weight: 202 lb (3 months ago)  % Usual Body Weight: 94%  BMI:  Body mass index is 26.44 kg/(m^2).  Estimated Nutritional Needs: Kcal: 1850-1950 Protein: 95-110 gm Fluid: 1.9-2 L  Skin: no wounds  Diet Order: NPO  EDUCATION NEEDS: -No education needs identified at this time.   Intake/Output Summary (Last 24 hours) at 02/13/13 1007 Last data  filed at 02/13/13 8119  Gross per 24 hour  Intake      0 ml  Output    900 ml  Net   -900 ml    Last BM: 2/2   Labs:   Recent Labs Lab 02/12/13 1920 02/13/13 0400  NA 143 144  K 3.6* 3.0*  CL 101 101  CO2 30 30  BUN 19 14  CREATININE 1.18 1.01  CALCIUM 9.6 8.9  GLUCOSE 112* 82    CBG (last 3)  No results found for this basename: GLUCAP,  in the last 72 hours  Scheduled Meds: . aMILoride  10 mg Oral Daily  . amLODipine  10 mg Oral Daily  . atenolol  50 mg Oral q morning - 10a  . cefTRIAXone (ROCEPHIN)  IV  1 g Intravenous Q24H  . citalopram  20 mg Oral Daily  . donepezil  10 mg Oral Daily  . ferrous sulfate  325 mg Oral BID WC  . LORazepam  0.5 mg Intravenous Once  . pantoprazole (PROTONIX) IV  40 mg Intravenous Q12H  . potassium chloride  10 mEq Intravenous Q1 Hr x 3  . sodium chloride  3 mL Intravenous Q12H  . tamsulosin  0.8 mg Oral QHS    Continuous Infusions:   Past Medical History  Diagnosis Date  . Generalized weakness   . Hypokalemia   . Renal disorder   . Hyperlipidemia   . Vitamin D deficiency   . Bell's palsy   .  Lumbar stenosis   . Hypertension   . Depression   . Dementia   . Prostate cancer ~ 2004    "had radiation tx" (11/24/2012)  . DVT (deep venous thrombosis)     "he's had them in both legs" (11/24/2012)  . Asthma     "as a child, real bad" (11/24/2012)  . History of bleeding ulcers ~ 1956    "in hospital for ~ 1 month" (11/24/2012)  . Recurrent UTI (urinary tract infection) 2013-now    "lots" (11/24/2012)  . DJD (degenerative joint disease)   . Arthritis     "joints" (11/24/2012)  . Anxiety     Past Surgical History  Procedure Laterality Date  . Total knee arthroplasty Left 02/2010  . Back surgery  2008    "for stenosis" (11/24/2012)     Molli Barrows, [place], [place], [place] Pager [phone] After Hours Pager [phone]

## 2013-02-13 NOTE — ED Provider Notes (Signed)
Medical screening examination/treatment/procedure(s) were performed by non-physician practitioner and as supervising physician I was immediately available for consultation/collaboration.     Merryl Hacker, MD 02/13/13 [phone]

## 2013-02-13 NOTE — Progress Notes (Signed)
Notified Dr. Delfina Redwood of patient having ventricular arrythmia, pt asymptomatic, pt receiving runs of potassium. Continue to monitor. Carroll Kinds RN

## 2013-02-13 NOTE — ED Notes (Signed)
This RN called to give report x 2, and was told that the room was changed, and the receiving nurse was not aware of the change. This RN will call back in 10 minutes.

## 2013-02-13 NOTE — ED Provider Notes (Signed)
Medical screening examination/treatment/procedure(s) were conducted as a shared visit with non-physician practitioner(s) and myself.  I personally evaluated the patient during the encounter.    Patient with melena on coumadin for DVT. He is well appearing, has voluntary guarding but only minimal complaints of abd pain. Will need CT, labs and admission  Joshua Hamburger, MD 02/13/13 1527

## 2013-02-13 NOTE — ED Notes (Addendum)
Attempted to call report x 1. This RN was placed on hold, and the phone was not answered.

## 2013-02-13 NOTE — Progress Notes (Signed)
Subjective: Admission H&P review. Per wife patient has been having dark stools, they are  always dark because of iron. There is no bright red stool. Patient's INR is therapeutic H&H has been stable. He is a new complaint of difficulty swallowing like food is getting stuck. There is no history of this in the past. Patient's wife does admit to given him some Aleve for pain. I reiterated to her again today that she needs to avoid all NSAIDs. There's been no coffee ground emesis.  Objective: Vital signs in last 24 hours: Temp:  [98.1 F (36.7 C)-98.5 F (36.9 C)] 98.4 F (36.9 C) (02/02 0613) Pulse Rate:  [69-81] 80 (02/02 0613) Resp:  [18-25] 18 (02/02 0613) BP: (122-184)/(80-102) 157/92 mmHg (02/02 0613) SpO2:  [95 %-100 %] 95 % (02/02 0613) Weight:  [85.957 kg (189 lb 8 oz)] 85.957 kg (189 lb 8 oz) (02/02 0203) Weight change:  Last BM Date: 02/13/13  Intake/Output from previous day: 02/01 0701 - 02/02 0700 In: -  Out: 900 [Urine:900] Intake/Output this shift:    General appearance: pleasant, no apparent distress Resp: clear to auscultation bilaterally Cardio: regular rate and rhythm GI: soft, non-tender; bowel sounds normal; no masses,  no organomegaly Extremities: chronic lower extremity edema left greater than right  Lab Results:  Results for orders placed during the hospital encounter of 02/12/13 (from the past 24 hour(s))  CBC WITH DIFFERENTIAL     Status: Abnormal   Collection Time    02/12/13  7:20 PM      Result Value Range   WBC 3.7 (*) 4.0 - 10.5 K/uL   RBC 3.94 (*) 4.22 - 5.81 MIL/uL   Hemoglobin 11.7 (*) 13.0 - 17.0 g/dL   HCT 36.6 (*) 39.0 - 52.0 %   MCV 92.9  78.0 - 100.0 fL   MCH 29.7  26.0 - 34.0 pg   MCHC 32.0  30.0 - 36.0 g/dL   RDW 14.4  11.5 - 15.5 %   Platelets 198  150 - 400 K/uL   Neutrophils Relative % 65  43 - 77 %   Neutro Abs 2.4  1.7 - 7.7 K/uL   Lymphocytes Relative 24  12 - 46 %   Lymphs Abs 0.9  0.7 - 4.0 K/uL   Monocytes Relative 10  3 -  12 %   Monocytes Absolute 0.4  0.1 - 1.0 K/uL   Eosinophils Relative 1  0 - 5 %   Eosinophils Absolute 0.0  0.0 - 0.7 K/uL   Basophils Relative 0  0 - 1 %   Basophils Absolute 0.0  0.0 - 0.1 K/uL  COMPREHENSIVE METABOLIC PANEL     Status: Abnormal   Collection Time    02/12/13  7:20 PM      Result Value Range   Sodium 143  137 - 147 mEq/L   Potassium 3.6 (*) 3.7 - 5.3 mEq/L   Chloride 101  96 - 112 mEq/L   CO2 30  19 - 32 mEq/L   Glucose, Bld 112 (*) 70 - 99 mg/dL   BUN 19  6 - 23 mg/dL   Creatinine, Ser 1.18  0.50 - 1.35 mg/dL   Calcium 9.6  8.4 - 10.5 mg/dL   Total Protein 6.8  6.0 - 8.3 g/dL   Albumin 3.7  3.5 - 5.2 g/dL   AST 18  0 - 37 U/L   ALT 10  0 - 53 U/L   Alkaline Phosphatase 54  39 - 117 U/L  Total Bilirubin 0.3  0.3 - 1.2 mg/dL   GFR calc non Af Amer 54 (*) >90 mL/min   GFR calc Af Amer 62 (*) >90 mL/min  PROTIME-INR     Status: Abnormal   Collection Time    02/12/13  7:20 PM      Result Value Range   Prothrombin Time 27.9 (*) 11.6 - 15.2 seconds   INR 2.72 (*) 0.00 - 1.49  APTT     Status: None   Collection Time    02/12/13  7:20 PM      Result Value Range   aPTT 37  24 - 37 seconds  LIPASE, BLOOD     Status: Abnormal   Collection Time    02/12/13  7:20 PM      Result Value Range   Lipase 119 (*) 11 - 59 U/L  TROPONIN I     Status: None   Collection Time    02/12/13  7:20 PM      Result Value Range   Troponin I <0.30  <0.30 ng/mL  OCCULT BLOOD, POC DEVICE     Status: Abnormal   Collection Time    02/12/13  7:37 PM      Result Value Range   Fecal Occult Bld POSITIVE (*) NEGATIVE  URINALYSIS, ROUTINE W REFLEX MICROSCOPIC     Status: Abnormal   Collection Time    02/12/13  7:56 PM      Result Value Range   Color, Urine YELLOW  YELLOW   APPearance TURBID (*) CLEAR   Specific Gravity, Urine 1.017  1.005 - 1.030   pH 7.0  5.0 - 8.0   Glucose, UA NEGATIVE  NEGATIVE mg/dL   Hgb urine dipstick SMALL (*) NEGATIVE   Bilirubin Urine NEGATIVE  NEGATIVE    Ketones, ur NEGATIVE  NEGATIVE mg/dL   Protein, ur NEGATIVE  NEGATIVE mg/dL   Urobilinogen, UA 0.2  0.0 - 1.0 mg/dL   Nitrite POSITIVE (*) NEGATIVE   Leukocytes, UA LARGE (*) NEGATIVE  URINE MICROSCOPIC-ADD ON     Status: Abnormal   Collection Time    02/12/13  7:56 PM      Result Value Range   Squamous Epithelial / LPF FEW (*) RARE   WBC, UA 21-50  <3 WBC/hpf   RBC / HPF 3-6  <3 RBC/hpf   Bacteria, UA MANY (*) RARE  TROPONIN I     Status: None   Collection Time    02/13/13 12:06 AM      Result Value Range   Troponin I <0.30  <0.30 ng/mL  CG4 I-STAT (LACTIC ACID)     Status: None   Collection Time    02/13/13 12:13 AM      Result Value Range   Lactic Acid, Venous 1.04  0.5 - 2.2 mmol/L  CBC WITH DIFFERENTIAL     Status: Abnormal   Collection Time    02/13/13  4:00 AM      Result Value Range   WBC 4.4  4.0 - 10.5 K/uL   RBC 3.76 (*) 4.22 - 5.81 MIL/uL   Hemoglobin 11.2 (*) 13.0 - 17.0 g/dL   HCT 34.9 (*) 39.0 - 52.0 %   MCV 92.8  78.0 - 100.0 fL   MCH 29.8  26.0 - 34.0 pg   MCHC 32.1  30.0 - 36.0 g/dL   RDW 14.4  11.5 - 15.5 %   Platelets 199  150 - 400 K/uL   Neutrophils Relative % 65  43 - 77 %   Neutro Abs 2.9  1.7 - 7.7 K/uL   Lymphocytes Relative 23  12 - 46 %   Lymphs Abs 1.0  0.7 - 4.0 K/uL   Monocytes Relative 11  3 - 12 %   Monocytes Absolute 0.5  0.1 - 1.0 K/uL   Eosinophils Relative 1  0 - 5 %   Eosinophils Absolute 0.0  0.0 - 0.7 K/uL   Basophils Relative 0  0 - 1 %   Basophils Absolute 0.0  0.0 - 0.1 K/uL  COMPREHENSIVE METABOLIC PANEL     Status: Abnormal   Collection Time    02/13/13  4:00 AM      Result Value Range   Sodium 144  137 - 147 mEq/L   Potassium 3.0 (*) 3.7 - 5.3 mEq/L   Chloride 101  96 - 112 mEq/L   CO2 30  19 - 32 mEq/L   Glucose, Bld 82  70 - 99 mg/dL   BUN 14  6 - 23 mg/dL   Creatinine, Ser 1.30  0.50 - 1.35 mg/dL   Calcium 8.9  8.4 - 86.5 mg/dL   Total Protein 6.5  6.0 - 8.3 g/dL   Albumin 3.6  3.5 - 5.2 g/dL   AST 16  0 -  37 U/L   ALT 9  0 - 53 U/L   Alkaline Phosphatase 49  39 - 117 U/L   Total Bilirubin 0.3  0.3 - 1.2 mg/dL   GFR calc non Af Amer 65 (*) >90 mL/min   GFR calc Af Amer 75 (*) >90 mL/min  PROTIME-INR     Status: Abnormal   Collection Time    02/13/13  4:00 AM      Result Value Range   Prothrombin Time 27.8 (*) 11.6 - 15.2 seconds   INR 2.71 (*) 0.00 - 1.49  APTT     Status: Abnormal   Collection Time    02/13/13  4:00 AM      Result Value Range   aPTT 40 (*) 24 - 37 seconds      Studies/Results: Ct Abdomen Pelvis Wo Contrast  02/12/2013   CLINICAL DATA:  Black tarry stools, rigid abdomen. Suspect urinary tract infection. Status post left renal tumor ablation.  EXAM: CT ABDOMEN AND PELVIS WITHOUT CONTRAST  TECHNIQUE: Multidetector CT imaging of the abdomen and pelvis was performed following the standard protocol without intravenous contrast.  COMPARISON:  CT of the abdomen April 15, 2007  FINDINGS: Included view of the lung bases are clear though, there is mild respiratory motion. Heart size is normal. Mild aortic annular calcifications. The included pericardium is unremarkable.  Small hiatal hernia. The stomach, small and large bowel are normal in course and caliber without inflammatory changes. Normal appendix. There may be a small component of rectal prolapse. No intraperitoneal free fluid nor free air.  The liver, spleen, pancreas, gallbladder are unremarkable for this noncontrast examination. 22 x 25 mm left adrenal nodule (9 Hounsfield units), 11 x 13 mm right adrenal nodule (-14 Hounsfield units) both of which are representative of benign adrenal adenomas.  2 mm nonobstructing right lower pole renal calculus associated with mild scarring. No hydronephrosis. No left nephrolithiasis. 10 mm cyst in right interpolar kidney is unchanged. Ureters are normal in course and caliber. Eccentric posterior urinary bladder wall thickening measures 11 mm.  Great vessels are overall normal in course and  caliber with moderate calcific atherosclerosis. Small right fat containing inguinal hernia. Left pelvic  iliopsoas muscle intramuscular lipoma. Moderate fat containing umbilical hernia. Severe L5-S1 degenerative disc disease. Status post L4-5 laminectomies.  IMPRESSION: Eccentric posterior urinary bladder wall thickening, and of this could reflect cystitis, tumor could have a similar appearance. Consider cystoscopy upon resolution of acute symptoms.  No acute intra-abdominal or pelvic process.  Right lower pole renal scarring associated with a nonobstructing 2 mm calculus.   Electronically Signed   By: Elon Alas   On: 02/12/2013 23:22   Dg Chest 2 View  02/13/2013   CLINICAL DATA:  Aspiration.  EXAM: CHEST  2 VIEW  COMPARISON:  Chest radiograph November 22, 2012  FINDINGS: The cardiac silhouette remains limits of normal in size, mediastinal silhouette is nonsuspicious. Both lungs are clear. Elevated left hemidiaphragm. No pneumothorax. The visualized skeletal structures are unremarkable. Multiple EKG lines overlie the patient and may obscure subtle underlying pathology. Mild degenerative change of the thoracic spine.  IMPRESSION: No acute cardiopulmonary process.   Electronically Signed   By: Elon Alas   On: 02/13/2013 02:57    Medications:  Prior to Admission:  Prescriptions prior to admission  Medication Sig Dispense Refill  . ALPRAZolam (XANAX) 0.5 MG tablet Take 0.5 mg by mouth 3 (three) times daily as needed for anxiety. For anxiety.      Marland Kitchen aMILoride (MIDAMOR) 5 MG tablet Take 10 mg by mouth daily.      Marland Kitchen amLODipine (NORVASC) 10 MG tablet Take 10 mg by mouth daily.      Marland Kitchen atenolol (TENORMIN) 50 MG tablet Take 50 mg by mouth every morning.      . Calcium-Vitamin D (CALTRATE 600 PLUS-VIT D PO) Take 1 tablet by mouth 2 (two) times daily.      . cholecalciferol (VITAMIN D) 1000 UNITS tablet Take 1,000 Units by mouth daily.      . citalopram (CELEXA) 20 MG tablet Take 20 mg by mouth  daily.      . Cyanocobalamin (VITAMIN B 12 PO) Take 1,000 mg by mouth daily.      Marland Kitchen donepezil (ARICEPT) 10 MG tablet Take 10 mg by mouth daily.      . ferrous sulfate 324 (65 FE) MG TBEC Take 1 tablet by mouth 2 (two) times daily.      . Glucosamine-Chondroit-Vit C-Mn (GLUCOSAMINE CHONDR 1500 COMPLX) CAPS Take 1 capsule by mouth daily.      . potassium chloride SA (K-DUR,KLOR-CON) 20 MEQ tablet Take 20 mEq by mouth 2 (two) times daily.      . Tamsulosin HCl (FLOMAX) 0.4 MG CAPS Take 0.8 mg by mouth at bedtime.      Marland Kitchen warfarin (COUMADIN) 2 MG tablet Take 1-2 mg by mouth daily. Take 1/2 tablet (1mg ) on saturdays and sundays all other days take 1tablet (2mg )       Scheduled: . aMILoride  10 mg Oral Daily  . amLODipine  10 mg Oral Daily  . atenolol  50 mg Oral q morning - 10a  . cefTRIAXone (ROCEPHIN)  IV  1 g Intravenous Q24H  . citalopram  20 mg Oral Daily  . donepezil  10 mg Oral Daily  . ferrous sulfate  325 mg Oral BID WC  . LORazepam  0.5 mg Intravenous Once  . pantoprazole (PROTONIX) IV  40 mg Intravenous Q12H  . sodium chloride  3 mL Intravenous Q12H  . tamsulosin  0.8 mg Oral QHS   Continuous:   Assessment/Plan: Elderly male, on chronic Coumadin for B/L DVT presenting with dark stools. Of note  he takes iron and stools are always dark.  H&H remains stable. Unfortunately wife has been giving him NSAIDs. Patient also with some complaints of dysphagia. Agree with GI evaluation to discuss if the endoscopy indicated. 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

## 2013-02-14 LAB — BASIC METABOLIC PANEL
BUN: 17 mg/dL (ref 6–23)
CALCIUM: 8.6 mg/dL (ref 8.4–10.5)
CHLORIDE: 101 meq/L (ref 96–112)
CO2: 28 mEq/L (ref 19–32)
Creatinine, Ser: 1.34 mg/dL (ref 0.50–1.35)
GFR calc non Af Amer: 46 mL/min — ABNORMAL LOW (ref >90)
GFR, EST AFRICAN AMERICAN: 53 mL/min — AB (ref >90)
Glucose, Bld: 113 mg/dL — ABNORMAL HIGH (ref 70–99)
Potassium: 3.3 mEq/L — ABNORMAL LOW (ref 3.7–5.3)
Sodium: 142 mEq/L (ref 137–147)

## 2013-02-14 LAB — CBC
HEMATOCRIT: 31.9 % — AB (ref 39.0–52.0)
Hemoglobin: 10.3 g/dL — ABNORMAL LOW (ref 13.0–17.0)
MCH: 29.4 pg (ref 26.0–34.0)
MCHC: 32.3 g/dL (ref 30.0–36.0)
MCV: 91.1 fL (ref 78.0–100.0)
PLATELETS: 166 10*3/uL (ref 150–400)
RBC: 3.5 MIL/uL — ABNORMAL LOW (ref 4.22–5.81)
RDW: 14.5 % (ref 11.5–15.5)
WBC: 4.3 10*3/uL (ref 4.0–10.5)

## 2013-02-14 LAB — URINE CULTURE

## 2013-02-14 LAB — PROTIME-INR
INR: 2.64 — AB (ref 0.00–1.49)
PROTHROMBIN TIME: 27.3 s — AB (ref 11.6–15.2)

## 2013-02-14 MED ORDER — POTASSIUM CHLORIDE CRYS ER 20 MEQ PO TBCR
20.0000 meq | EXTENDED_RELEASE_TABLET | Freq: Two times a day (BID) | ORAL | Status: DC
  Administered 2013-02-14 – 2013-02-15 (×4): 20 meq via ORAL
  Filled 2013-02-14 (×6): qty 1

## 2013-02-14 MED ORDER — CIPROFLOXACIN HCL 250 MG PO TABS
250.0000 mg | ORAL_TABLET | Freq: Two times a day (BID) | ORAL | Status: DC
  Administered 2013-02-14 – 2013-02-16 (×5): 250 mg via ORAL
  Filled 2013-02-14 (×6): qty 1

## 2013-02-14 NOTE — Evaluation (Signed)
Physical Therapy Evaluation Patient Details Name: Joshua Rodgers MRN: [id number] DOB: November 20, 1923 Today's Date: 02/14/2013 Time: 1027-2536 PT Time Calculation (min): 30 min  PT Assessment / Plan / Recommendation History of Present Illness  Joshua Rodgers is a 80 y.o. male whom we've been asked to see for above symptoms.  Patient has history of chronic dementia, and while he has some troubles with comprehension of some questions, he is able to provide a history for me.  He describes having 2-3 black stools per day for the past couple months.  He is on iron, and I questioned him whether his stools are always dark on oral iron, and he's not sure.  Hgb is around 12, which is near (or a little higher than) his chronic baseline, dating back for a few years, upon my review of his outpatient chart.  Patient told primary team he was having dysphagia, but denies this to me.  Speech therapy has already seen the patient and recommends regular diet with thin liquids.  Patient denies abdominal pain, hematemesis, hematochezia.  Patient denies ever having had an endoscopy or colonoscopy, and I can not locate any endoscopic report either here on EPIC or on our [facility] outpatient EMR.  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  Has been taking increase in NSAIDs (naproxen) for pain of late.  Clinical Impression  Pt admitted with/for melena and general weakness.  Pt currently limited functionally due to the problems listed. ( See problems list.)   Pt will benefit from PT to maximize function and safety in order to get ready for next venue listed below.     PT Assessment  Patient needs continued PT services    Follow Up Recommendations  SNF;Other (comment) (or extra PCA help if pt/wife insist on home)    Does the patient have the potential to tolerate intense rehabilitation      Barriers to Discharge   It is likely getting close to the point where wife will have too much  difficulty to handle hime on bad days.    Equipment Recommendations  None recommended by PT    Recommendations for Other Services     Frequency Min 2X/week    Precautions / Restrictions Precautions Precautions: Fall   Pertinent Vitals/Pain       Mobility  Bed Mobility Overal bed mobility: Needs Assistance Bed Mobility: Supine to Sit;Sit to Supine Supine to sit: Mod assist Sit to supine: Mod assist General bed mobility comments: pt can be assisted with one, but it would be best right now qith 2 person assist for caregiver safety Transfers Overall transfer level: Needs assistance Transfers: Sit to/from Stand;Squat Pivot Transfers Sit to Stand: Mod assist Squat pivot transfers: Mod assist;Max assist;+2 safety/equipment General transfer comment: cues for set up.  assist to come forward and lifting    Exercises     PT Diagnosis: Generalized weakness  PT Problem List: Decreased strength;Decreased activity tolerance;Decreased balance;Decreased mobility;Decreased coordination;Decreased range of motion PT Treatment Interventions: Functional mobility training;Therapeutic activities;DME instruction;Therapeutic exercise;Balance training;Patient/family education     PT Goals(Current goals can be found in the care plan section) Acute Rehab PT Goals Patient Stated Goal: pt/wife did not state. PT Goal Formulation: With patient/family Time For Goal Achievement: 02/28/13 Potential to Achieve Goals: Fair  Visit Information  Last PT Received On: 02/14/13 Assistance Needed: +2 History of Present Illness: Joshua Rodgers is a 80 y.o. male whom we've been asked to see for above symptoms.  Patient has history of chronic dementia, and while he has some troubles with comprehension of some questions, he is able to provide a history for me.  He describes having 2-3 black stools per day for the past couple months.  He is on iron, and I questioned him whether his stools are always dark on oral iron,  and he's not sure.  Hgb is around 12, which is near (or a little higher than) his chronic baseline, dating back for a few years, upon my review of his outpatient chart.  Patient told primary team he was having dysphagia, but denies this to me.  Speech therapy has already seen the patient and recommends regular diet with thin liquids.  Patient denies abdominal pain, hematemesis, hematochezia.  Patient denies ever having had an endoscopy or colonoscopy, and I can not locate any endoscopic report either here on EPIC or on our [facility] outpatient EMR.  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  Has been taking increase in NSAIDs (naproxen) for pain of late.       Prior [place] expects to be discharged to:: Private residence Living Arrangements: Spouse/significant other Available Help at Discharge: Family;Home health;Personal care attendant Type of Home: House Home Access: Ramped entrance [place]: One [place]: Wheelchair - power;Bedside commode;Other (comment) (hoyer lift) Prior Function Level of Independence: Needs assistance Gait / Transfers Assistance Needed: wife reports she can usually transfer pt herself with bedpad under his hips (level transfer bed to electric w/c). On his bad days, it takes 2 people or use the hoyer lift.  ADL's / Homemaking Assistance Needed: total assist Comments: Wife reported they use a condom catheter with a leg bag and a depends at home. Discussed with RN if they can switch pt to a leg bag and decided to use mesh briefs with sanitary pad (wife had not brought a brief and hospital does not use them). PT left while nursing dressed pt so once he was up in his electric chair, he would be ready for d/c. Communication Communication: No difficulties    Cognition  Cognition Arousal/Alertness: Awake/alert Behavior During Therapy: WFL for tasks assessed/performed Overall Cognitive  Status: History of cognitive impairments - at baseline    Extremity/Trunk Assessment Upper Extremity Assessment Upper Extremity Assessment: Generalized weakness Lower Extremity Assessment Lower Extremity Assessment: Generalized weakness;LLE deficits/detail LLE Deficits / Details: hip/knee aarom limited and generally weak Cervical / Trunk Assessment Cervical / Trunk Assessment: Other exceptions (generally weak)   Balance Balance Overall balance assessment: Needs assistance Sitting-balance support: Feet supported;Bilateral upper extremity supported Sitting balance-Leahy Scale: Poor Sitting balance - Comments: tends to lean Left moderately and unable to attain midline by himself Postural control: Left lateral lean Standing balance support: Bilateral upper extremity supported;During functional activity Standing balance-Leahy Scale: Zero  End of Session PT - End of Session Activity Tolerance: Patient tolerated treatment well Patient left: in bed;with family/visitor present Nurse Communication: Mobility status;Need for lift equipment  GP     Aubert Choyce, Tessie Fass 02/14/2013, 5:41 PM 02/14/2013  Donnella Sham, PT [phone]  (pager)

## 2013-02-14 NOTE — Progress Notes (Signed)
I have discussed case with Dr. Delfina Redwood, who has asked me to hold off on endoscopy at the present time.  If patient's Hgb drops significantly, or if he develops overt GI bleeding, Dr. Delfina Redwood says that he will call me back.  Accordingly, I will sign-off, but happy to see again if needed.

## 2013-02-14 NOTE — Progress Notes (Signed)
Subjective: No problems overnight no signs of bleeding. Patient's hemoglobin down 1 g, he did receive IV fluids.  Objective: Vital signs in last 24 hours: Temp:  [98.4 F (36.9 C)-100 F (37.8 C)] 98.7 F (37.1 C) (02/03 0500) Pulse Rate:  [61-72] 63 (02/03 1011) Resp:  [16-18] 16 (02/03 0500) BP: (115-158)/(76-85) 158/85 mmHg (02/03 1011) SpO2:  [94 %-97 %] 96 % (02/03 0500) Weight change:  Last BM Date: 02/13/13  Intake/Output from previous day: 02/02 0701 - 02/03 0700 In: -  Out: 1400 [Urine:1400] Intake/Output this shift:    General appearance: alert and cooperative Resp: clear to auscultation bilaterally Cardio: regular rate and rhythm GI: soft, non-tender; bowel sounds normal; no masses,  no organomegaly Extremities: bilateral lower extremity edema left greater than right  Lab Results:  Results for orders placed during the hospital encounter of 02/12/13 (from the past 24 hour(s))  PROTIME-INR     Status: Abnormal   Collection Time    02/14/13  6:40 AM      Result Value Range   Prothrombin Time 27.3 (*) 11.6 - 15.2 seconds   INR 2.64 (*) 0.00 - 6.28  BASIC METABOLIC PANEL     Status: Abnormal   Collection Time    02/14/13  9:38 AM      Result Value Range   Sodium 142  137 - 147 mEq/L   Potassium 3.3 (*) 3.7 - 5.3 mEq/L   Chloride 101  96 - 112 mEq/L   CO2 28  19 - 32 mEq/L   Glucose, Bld 113 (*) 70 - 99 mg/dL   BUN 17  6 - 23 mg/dL   Creatinine, Ser 1.34  0.50 - 1.35 mg/dL   Calcium 8.6  8.4 - 10.5 mg/dL   GFR calc non Af Amer 46 (*) >90 mL/min   GFR calc Af Amer 53 (*) >90 mL/min  CBC     Status: Abnormal   Collection Time    02/14/13  9:38 AM      Result Value Range   WBC 4.3  4.0 - 10.5 K/uL   RBC 3.50 (*) 4.22 - 5.81 MIL/uL   Hemoglobin 10.3 (*) 13.0 - 17.0 g/dL   HCT 31.9 (*) 39.0 - 52.0 %   MCV 91.1  78.0 - 100.0 fL   MCH 29.4  26.0 - 34.0 pg   MCHC 32.3  30.0 - 36.0 g/dL   RDW 14.5  11.5 - 15.5 %   Platelets 166  150 - 400 K/uL       Studies/Results: Ct Abdomen Pelvis Wo Contrast  02/12/2013   CLINICAL DATA:  Black tarry stools, rigid abdomen. Suspect urinary tract infection. Status post left renal tumor ablation.  EXAM: CT ABDOMEN AND PELVIS WITHOUT CONTRAST  TECHNIQUE: Multidetector CT imaging of the abdomen and pelvis was performed following the standard protocol without intravenous contrast.  COMPARISON:  CT of the abdomen April 15, 2007  FINDINGS: Included view of the lung bases are clear though, there is mild respiratory motion. Heart size is normal. Mild aortic annular calcifications. The included pericardium is unremarkable.  Small hiatal hernia. The stomach, small and large bowel are normal in course and caliber without inflammatory changes. Normal appendix. There may be a small component of rectal prolapse. No intraperitoneal free fluid nor free air.  The liver, spleen, pancreas, gallbladder are unremarkable for this noncontrast examination. 22 x 25 mm left adrenal nodule (9 Hounsfield units), 11 x 13 mm right adrenal nodule (-14 Hounsfield units) both of  which are representative of benign adrenal adenomas.  2 mm nonobstructing right lower pole renal calculus associated with mild scarring. No hydronephrosis. No left nephrolithiasis. 10 mm cyst in right interpolar kidney is unchanged. Ureters are normal in course and caliber. Eccentric posterior urinary bladder wall thickening measures 11 mm.  Great vessels are overall normal in course and caliber with moderate calcific atherosclerosis. Small right fat containing inguinal hernia. Left pelvic iliopsoas muscle intramuscular lipoma. Moderate fat containing umbilical hernia. Severe L5-S1 degenerative disc disease. Status post L4-5 laminectomies.  IMPRESSION: Eccentric posterior urinary bladder wall thickening, and of this could reflect cystitis, tumor could have a similar appearance. Consider cystoscopy upon resolution of acute symptoms.  No acute intra-abdominal or pelvic  process.  Right lower pole renal scarring associated with a nonobstructing 2 mm calculus.   Electronically Signed   By: Elon Alas   On: 02/12/2013 23:22   Dg Chest 2 View  02/13/2013   CLINICAL DATA:  Aspiration.  EXAM: CHEST  2 VIEW  COMPARISON:  Chest radiograph November 22, 2012  FINDINGS: The cardiac silhouette remains limits of normal in size, mediastinal silhouette is nonsuspicious. Both lungs are clear. Elevated left hemidiaphragm. No pneumothorax. The visualized skeletal structures are unremarkable. Multiple EKG lines overlie the patient and may obscure subtle underlying pathology. Mild degenerative change of the thoracic spine.  IMPRESSION: No acute cardiopulmonary process.   Electronically Signed   By: Elon Alas   On: 02/13/2013 02:57    Medications:  Prior to Admission:  Prescriptions prior to admission  Medication Sig Dispense Refill  . ALPRAZolam (XANAX) 0.5 MG tablet Take 0.5 mg by mouth 3 (three) times daily as needed for anxiety. For anxiety.      Marland Kitchen aMILoride (MIDAMOR) 5 MG tablet Take 10 mg by mouth daily.      Marland Kitchen amLODipine (NORVASC) 10 MG tablet Take 10 mg by mouth daily.      Marland Kitchen atenolol (TENORMIN) 50 MG tablet Take 50 mg by mouth every morning.      . Calcium-Vitamin D (CALTRATE 600 PLUS-VIT D PO) Take 1 tablet by mouth 2 (two) times daily.      . cholecalciferol (VITAMIN D) 1000 UNITS tablet Take 1,000 Units by mouth daily.      . citalopram (CELEXA) 20 MG tablet Take 20 mg by mouth daily.      . Cyanocobalamin (VITAMIN B 12 PO) Take 1,000 mg by mouth daily.      Marland Kitchen donepezil (ARICEPT) 10 MG tablet Take 10 mg by mouth daily.      . ferrous sulfate 324 (65 FE) MG TBEC Take 1 tablet by mouth 2 (two) times daily.      . Glucosamine-Chondroit-Vit C-Mn (GLUCOSAMINE CHONDR 1500 COMPLX) CAPS Take 1 capsule by mouth daily.      . potassium chloride SA (K-DUR,KLOR-CON) 20 MEQ tablet Take 20 mEq by mouth 2 (two) times daily.      . Tamsulosin HCl (FLOMAX) 0.4 MG CAPS  Take 0.8 mg by mouth at bedtime.      Marland Kitchen warfarin (COUMADIN) 2 MG tablet Take 1-2 mg by mouth daily. Take 1/2 tablet (1mg ) on saturdays and sundays all other days take 1tablet (2mg )       Scheduled: . aMILoride  10 mg Oral Daily  . amLODipine  10 mg Oral Daily  . atenolol  50 mg Oral q morning - 10a  . cefTRIAXone (ROCEPHIN)  IV  1 g Intravenous Q24H  . citalopram  20 mg Oral Daily  .  donepezil  10 mg Oral Daily  . ferrous sulfate  325 mg Oral BID WC  . LORazepam  0.5 mg Intravenous Once  . pantoprazole (PROTONIX) IV  40 mg Intravenous Q12H  . potassium chloride  20 mEq Oral BID  . sodium chloride  3 mL Intravenous Q12H  . tamsulosin  0.8 mg Oral QHS   Continuous:  [id number], ondansetron (ZOFRAN) IV, ondansetron  Assessment/Plan: Elderly male, on chronic Coumadin for B/L DVT presenting with dark stools. Of note he takes iron and stools are always dark. H&H has dropped some after receiving IV fluids.patient has been seen by GI. So far no overt signs of bleeding. We will continue to hold Coumadin and if hemoglobin continues to drift endoscopy will be indicated.  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  Dementia , at baseline  Hypertension  Primary hyperaldosteronism  CHF diastolic dysfunction chronic  Spinal stenosis, essentially wheelchair-bound  Prostate cancer   LOS: 2 days   Arnesia Vincelette D 02/14/2013, 11:52 AM

## 2013-02-15 LAB — CBC
HCT: 30.6 % — ABNORMAL LOW (ref 39.0–52.0)
Hemoglobin: 10.1 g/dL — ABNORMAL LOW (ref 13.0–17.0)
MCH: 30.2 pg (ref 26.0–34.0)
MCHC: 33 g/dL (ref 30.0–36.0)
MCV: 91.6 fL (ref 78.0–100.0)
PLATELETS: 174 10*3/uL (ref 150–400)
RBC: 3.34 MIL/uL — ABNORMAL LOW (ref 4.22–5.81)
RDW: 14.2 % (ref 11.5–15.5)
WBC: 3.8 10*3/uL — AB (ref 4.0–10.5)

## 2013-02-15 LAB — PROTIME-INR
INR: 1.97 — AB (ref 0.00–1.49)
Prothrombin Time: 21.8 seconds — ABNORMAL HIGH (ref 11.6–15.2)

## 2013-02-15 MED ORDER — ENSURE COMPLETE PO LIQD
237.0000 mL | Freq: Every day | ORAL | Status: DC
  Administered 2013-02-15: 237 mL via ORAL

## 2013-02-15 MED ORDER — SODIUM CHLORIDE 0.9 % IV SOLN
INTRAVENOUS | Status: DC
  Administered 2013-02-15: 21:00:00 via INTRAVENOUS
  Administered 2013-02-16: 500 mL via INTRAVENOUS

## 2013-02-15 NOTE — Progress Notes (Signed)
Subjective: No problems overnight, no bleeding. Followup hemoglobin pending. Physical therapy note reviewed, skilled nursing facility recommended however patient's wife has always rejected that option.  Objective: Vital signs in last 24 hours: Temp:  [97.6 F (36.4 C)-98.3 F (36.8 C)] 98 F (36.7 C) (02/04 0300) Pulse Rate:  [58-70] 70 (02/04 0300) Resp:  [18] 18 (02/04 0300) BP: (127-158)/(68-85) 155/68 mmHg (02/04 0300) SpO2:  [95 %-96 %] 95 % (02/04 0300) Weight:  [84.55 kg (186 lb 6.4 oz)] 84.55 kg (186 lb 6.4 oz) (02/04 0300) Weight change:  Last BM Date: 02/13/13  Intake/Output from previous day: 02/03 0701 - 02/04 0700 In: 530 [P.O.:500; I.V.:30] Out: 375 [Urine:375] Intake/Output this shift:    General appearance: alert and cooperative Resp: clear to auscultation bilaterally Cardio: regular rate and rhythm and systolic murmur second right intercostal  space GI: soft, non-tender; bowel sounds normal; no masses,  no organomegaly Extremities: bilateral edema left greater than right  Lab Results:  Results for orders placed during the hospital encounter of 02/12/13 (from the past 24 hour(s))  BASIC METABOLIC PANEL     Status: Abnormal   Collection Time    02/14/13  9:38 AM      Result Value Range   Sodium 142  137 - 147 mEq/L   Potassium 3.3 (*) 3.7 - 5.3 mEq/L   Chloride 101  96 - 112 mEq/L   CO2 28  19 - 32 mEq/L   Glucose, Bld 113 (*) 70 - 99 mg/dL   BUN 17  6 - 23 mg/dL   Creatinine, Ser 1.34  0.50 - 1.35 mg/dL   Calcium 8.6  8.4 - 10.5 mg/dL   GFR calc non Af Amer 46 (*) >90 mL/min   GFR calc Af Amer 53 (*) >90 mL/min  CBC     Status: Abnormal   Collection Time    02/14/13  9:38 AM      Result Value Range   WBC 4.3  4.0 - 10.5 K/uL   RBC 3.50 (*) 4.22 - 5.81 MIL/uL   Hemoglobin 10.3 (*) 13.0 - 17.0 g/dL   HCT 31.9 (*) 39.0 - 52.0 %   MCV 91.1  78.0 - 100.0 fL   MCH 29.4  26.0 - 34.0 pg   MCHC 32.3  30.0 - 36.0 g/dL   RDW 14.5  11.5 - 15.5 %   Platelets 166  150 - 400 K/uL  PROTIME-INR     Status: Abnormal   Collection Time    02/15/13  3:55 AM      Result Value Range   Prothrombin Time 21.8 (*) 11.6 - 15.2 seconds   INR 1.97 (*) 0.00 - 1.49      Studies/Results: No results found.  Medications:  Prior to Admission:  Prescriptions prior to admission  Medication Sig Dispense Refill  . ALPRAZolam (XANAX) 0.5 MG tablet Take 0.5 mg by mouth 3 (three) times daily as needed for anxiety. For anxiety.      Marland Kitchen aMILoride (MIDAMOR) 5 MG tablet Take 10 mg by mouth daily.      Marland Kitchen amLODipine (NORVASC) 10 MG tablet Take 10 mg by mouth daily.      Marland Kitchen atenolol (TENORMIN) 50 MG tablet Take 50 mg by mouth every morning.      . Calcium-Vitamin D (CALTRATE 600 PLUS-VIT D PO) Take 1 tablet by mouth 2 (two) times daily.      . cholecalciferol (VITAMIN D) 1000 UNITS tablet Take 1,000 Units by mouth daily.      Marland Kitchen  citalopram (CELEXA) 20 MG tablet Take 20 mg by mouth daily.      . Cyanocobalamin (VITAMIN B 12 PO) Take 1,000 mg by mouth daily.      Marland Kitchen donepezil (ARICEPT) 10 MG tablet Take 10 mg by mouth daily.      . ferrous sulfate 324 (65 FE) MG TBEC Take 1 tablet by mouth 2 (two) times daily.      . Glucosamine-Chondroit-Vit C-Mn (GLUCOSAMINE CHONDR 1500 COMPLX) CAPS Take 1 capsule by mouth daily.      . potassium chloride SA (K-DUR,KLOR-CON) 20 MEQ tablet Take 20 mEq by mouth 2 (two) times daily.      . Tamsulosin HCl (FLOMAX) 0.4 MG CAPS Take 0.8 mg by mouth at bedtime.      Marland Kitchen warfarin (COUMADIN) 2 MG tablet Take 1-2 mg by mouth daily. Take 1/2 tablet (1mg ) on saturdays and sundays all other days take 1tablet (2mg )       Scheduled: . aMILoride  10 mg Oral Daily  . amLODipine  10 mg Oral Daily  . atenolol  50 mg Oral q morning - 10a  . ciprofloxacin  250 mg Oral BID  . citalopram  20 mg Oral Daily  . donepezil  10 mg Oral Daily  . ferrous sulfate  325 mg Oral BID WC  . LORazepam  0.5 mg Intravenous Once  . pantoprazole (PROTONIX) IV  40 mg  Intravenous Q12H  . potassium chloride  20 mEq Oral BID  . sodium chloride  3 mL Intravenous Q12H  . tamsulosin  0.8 mg Oral QHS   Continuous:  QMG:[id number], ondansetron (ZOFRAN) IV, ondansetron  Assessment/Plan: Elderly male, on chronic Coumadin for B/L DVT presenting with dark stools. Of note he takes iron and stools are always dark. H&H has dropped some after receiving IV fluids.patient has been seen by GI. So far no overt signs of bleeding. We will continue to hold Coumadin and if hemoglobin continues to drift endoscopy will be indicated. Today's followup hemoglobin is pending.  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  Dementia , at baseline  Hypertension  Primary hyperaldosteronism  CHF diastolic dysfunction chronic  Spinal stenosis, essentially wheelchair-bound  Prostate cancer   LOS: 3 days   Diamonds Lippard D 02/15/2013, 8:57 AM

## 2013-02-15 NOTE — Progress Notes (Signed)
Clinical Social Work Department BRIEF PSYCHOSOCIAL ASSESSMENT 02/15/2013  Patient:  Joshua Rodgers, Joshua Rodgers     Account Number:  [id number]     Admit date:  02/12/2013  Clinical Social Worker:  Adair Laundry  Date/Time:  02/15/2013 04:00 PM  Referred by:  Physician  Date Referred:  02/15/2013 Referred for  SNF Placement   Other Referral:   Interview type:  Family Other interview type:   Spoke with pt wife    PSYCHOSOCIAL DATA Living Status:  WIFE Admitted from facility:   Level of care:   Primary support name:  Joshua Rodgers [phone] Primary support relationship to patient:  SPOUSE Degree of support available:   Pt has very good support system    CURRENT CONCERNS Current Concerns  Post-Acute Placement   Other Concerns:    SOCIAL WORK ASSESSMENT / PLAN CSW aware of PT recommendation for SNF. CSW spoke with pt wife about recommendation. Pt wife is not agreeable to SNF she feels as though they have adequate care at home. CSW explained SNF referal and concern for pt safety. Pt wife feels this is not an issue.  Pt wife informed CSW that pt does have 24 hr supervision and has had home health in the past. Pt wife wanting to switch [employer] companies. CSW informed pt wife that RN CM would follow up with her about HH. CSW updated RN CM. At this time, pt has no further CSW needs. CSW signing off.   Assessment/plan status:  Psychosocial Support/Ongoing Assessment of Needs Other assessment/ plan:   Information/referral to community resources:   SNF list denied    PATIENT'S/FAMILY'S RESPONSE TO PLAN OF CARE: Pt family not agreeable to SNF and would like to dc home with Joshua Rodgers.       Joshua Rodgers, Joshua Rodgers

## 2013-02-15 NOTE — Progress Notes (Signed)
NUTRITION FOLLOW UP  Intervention:    Ensure Complete PO once daily to ensure adequate oral intake, each supplement provides 350 kcal and 13 grams of protein  Nutrition Dx:   Inadequate oral intake related to difficulty swallowing at home as evidenced by patient/wife report and weight loss.  Goal:   Intake to meet >90% of estimated nutrition needs.  Monitor:   PO intake, labs, weight trend.  Assessment:   Patient is a 80 year old male with PMH of dementia, HTN, prostate cancer, Bells palsy, asthma, spinal stenosis, UTI, presenting with dark stools and c/o new onset dysphagia characterized by c/o food getting stuck in throat.   Diet has been advanced to regular with thin liquids per SLP recommendations. PO intake is good; patient is consuming 75-95% of meals. Plans for endoscopy tomorrow due to decrease in Hgb.   Height: Ht Readings from Last 1 Encounters:  02/13/13 5\' 11"  (1.803 m)    Weight Status:   Wt Readings from Last 1 Encounters:  02/15/13 186 lb 6.4 oz (84.55 kg)  02/13/13  189 lb 8 oz (85.957 kg)   Re-estimated needs:  Kcal: 1850-1950  Protein: 95-110 gm  Fluid: 1.9-2 L  Skin: no wounds  Diet Order: General   Intake/Output Summary (Last 24 hours) at 02/15/13 1146 Last data filed at 02/15/13 0900  Gross per 24 hour  Intake    770 ml  Output    375 ml  Net    395 ml    Last BM: 2/2   Labs:   Recent Labs Lab 02/12/13 1920 02/13/13 0400 02/14/13 0938  NA 143 144 142  K 3.6* 3.0* 3.3*  CL 101 101 101  CO2 30 30 28   BUN 19 14 17   CREATININE 1.18 1.01 1.34  CALCIUM 9.6 8.9 8.6  GLUCOSE 112* 82 113*    CBG (last 3)  No results found for this basename: GLUCAP,  in the last 72 hours  Scheduled Meds: . aMILoride  10 mg Oral Daily  . amLODipine  10 mg Oral Daily  . atenolol  50 mg Oral q morning - 10a  . ciprofloxacin  250 mg Oral BID  . citalopram  20 mg Oral Daily  . donepezil  10 mg Oral Daily  . ferrous sulfate  325 mg Oral BID WC  .  LORazepam  0.5 mg Intravenous Once  . pantoprazole (PROTONIX) IV  40 mg Intravenous Q12H  . potassium chloride  20 mEq Oral BID  . sodium chloride  3 mL Intravenous Q12H  . tamsulosin  0.8 mg Oral QHS    Continuous Infusions: None   Molli Barrows, RD, LDN, [place] Pager [phone] After Hours Pager [phone]

## 2013-02-15 NOTE — Progress Notes (Signed)
Subjective: No abdominal pain. Tolerating diet. No overt blood in stool.  Objective: Vital signs in last 24 hours: Temp:  [97.6 F (36.4 C)-98.3 F (36.8 C)] 98 F (36.7 C) (02/04 0300) Pulse Rate:  [58-70] 69 (02/04 1012) Resp:  [18] 18 (02/04 0300) BP: (127-155)/(68-78) 134/73 mmHg (02/04 1012) SpO2:  [95 %-96 %] 95 % (02/04 0300) Weight:  [84.55 kg (186 lb 6.4 oz)] 84.55 kg (186 lb 6.4 oz) (02/04 0300) Weight change:  Last BM Date: 02/13/13  PE: GEN:  NAD ABD:  Soft  Lab Results:  INR 1.97  CBC    Component Value Date/Time   WBC 3.8* 02/15/2013 0830   RBC 3.34* 02/15/2013 0830   HGB 10.1* 02/15/2013 0830   HCT 30.6* 02/15/2013 0830   PLT 174 02/15/2013 0830   MCV 91.6 02/15/2013 0830   MCH 30.2 02/15/2013 0830   MCHC 33.0 02/15/2013 0830   RDW 14.2 02/15/2013 0830   LYMPHSABS 1.0 02/13/2013 0400   MONOABS 0.5 02/13/2013 0400   EOSABS 0.0 02/13/2013 0400   BASOSABS 0.0 02/13/2013 0400   Assessment:  1.  Anemia.  Chronic, slight interval decrease. 2.  Dark stools, hemoccult-positive. 3.  Dysphagia, denies now, unrevealing Speech Therapy evaluation.  Plan:  1.  Continue PPI. 2.  Dr. Delfina Redwood, given patient's decrease in Hgb, has asked me to do endoscopy. 3.  Will do endoscopy tomorrow, 02/16/13, reviewed with patient and wife.  Continue to hold warfarin in the meantime.   Joshua Rodgers 02/15/2013, 10:56 AM

## 2013-02-16 ENCOUNTER — Encounter (HOSPITAL_COMMUNITY): Payer: Self-pay | Admitting: Gastroenterology

## 2013-02-16 ENCOUNTER — Encounter (HOSPITAL_COMMUNITY)
Admission: EM | Disposition: A | Discharge status: Home / Self Care | Payer: Self-pay | Source: Home / Self Care | Attending: Internal Medicine

## 2013-02-16 HISTORY — PX: ESOPHAGOGASTRODUODENOSCOPY: SHX5428

## 2013-02-16 LAB — BASIC METABOLIC PANEL
BUN: 14 mg/dL (ref 6–23)
CO2: 29 mEq/L (ref 19–32)
Calcium: 8.5 mg/dL (ref 8.4–10.5)
Chloride: 104 mEq/L (ref 96–112)
Creatinine, Ser: 1.16 mg/dL (ref 0.50–1.35)
GFR calc Af Amer: 63 mL/min — ABNORMAL LOW (ref >90)
GFR, EST NON AFRICAN AMERICAN: 55 mL/min — AB (ref >90)
Glucose, Bld: 85 mg/dL (ref 70–99)
POTASSIUM: 3.4 meq/L — AB (ref 3.7–5.3)
Sodium: 146 mEq/L (ref 137–147)

## 2013-02-16 LAB — PROTIME-INR
INR: 1.48 (ref 0.00–1.49)
Prothrombin Time: 17.5 seconds — ABNORMAL HIGH (ref 11.6–15.2)

## 2013-02-16 SURGERY — EGD (ESOPHAGOGASTRODUODENOSCOPY)
Anesthesia: Moderate Sedation | Laterality: Left

## 2013-02-16 MED ORDER — FENTANYL CITRATE 0.05 MG/ML IJ SOLN
INTRAMUSCULAR | Status: AC
  Filled 2013-02-16: qty 2

## 2013-02-16 MED ORDER — PANTOPRAZOLE SODIUM 40 MG PO TBEC: 40.0000 mg | DELAYED_RELEASE_TABLET | Freq: Every day | ORAL | Status: DC

## 2013-02-16 MED ORDER — PANTOPRAZOLE SODIUM 40 MG PO TBEC
40.0000 mg | DELAYED_RELEASE_TABLET | Freq: Every day | ORAL | Status: DC
  Administered 2013-02-16: 40 mg via ORAL

## 2013-02-16 MED ORDER — MIDAZOLAM HCL 10 MG/2ML IJ SOLN
INTRAMUSCULAR | Status: DC | PRN
  Administered 2013-02-16 (×2): 1 mg via INTRAVENOUS

## 2013-02-16 MED ORDER — MIDAZOLAM HCL 5 MG/ML IJ SOLN
INTRAMUSCULAR | Status: AC
  Filled 2013-02-16: qty 2

## 2013-02-16 MED ORDER — BUTAMBEN-TETRACAINE-BENZOCAINE 2-2-14 % EX AERO
INHALATION_SPRAY | CUTANEOUS | Status: DC | PRN
  Administered 2013-02-16: 2 via TOPICAL

## 2013-02-16 NOTE — Progress Notes (Signed)
Subjective: Patient seen this morning prior to endoscopy, no complaints. Endoscopy report noted.  Objective: Vital signs in last 24 hours: Temp:  [97.7 F (36.5 C)-98.1 F (36.7 C)] 97.7 F (36.5 C) (02/05 0831) Pulse Rate:  [32-70] 61 (02/05 0945) Resp:  [15-24] 15 (02/05 0945) BP: (134-216)/(64-115) 208/96 mmHg (02/05 0945) SpO2:  [96 %-100 %] 100 % (02/05 0945) Weight:  [85.412 kg (188 lb 4.8 oz)] 85.412 kg (188 lb 4.8 oz) (02/05 0538) Weight change: 0.862 kg (1 lb 14.4 oz) Last BM Date: 02/13/13  Intake/Output from previous day: 02/04 0701 - 02/05 0700 In: 70 [P.O.:490] Out: 601 [Urine:600; Stool:1] Intake/Output this shift:    General appearance: alert and cooperative Resp: clear to auscultation bilaterally Cardio: regular rate and rhythm, S1, S2 normal, no murmur, click, rub or gallop GI: soft, non-tender; bowel sounds normal; no masses,  no organomegaly Extremities: decreased edema  Lab Results:  Results for orders placed during the hospital encounter of 02/12/13 (from the past 24 hour(s))  PROTIME-INR     Status: Abnormal   Collection Time    02/16/13  6:09 AM      Result Value Range   Prothrombin Time 17.5 (*) 11.6 - 15.2 seconds   INR 1.48  0.00 - 0.10  BASIC METABOLIC PANEL     Status: Abnormal   Collection Time    02/16/13  6:09 AM      Result Value Range   Sodium 146  137 - 147 mEq/L   Potassium 3.4 (*) 3.7 - 5.3 mEq/L   Chloride 104  96 - 112 mEq/L   CO2 29  19 - 32 mEq/L   Glucose, Bld 85  70 - 99 mg/dL   BUN 14  6 - 23 mg/dL   Creatinine, Ser 1.16  0.50 - 1.35 mg/dL   Calcium 8.5  8.4 - 10.5 mg/dL   GFR calc non Af Amer 55 (*) >90 mL/min   GFR calc Af Amer 63 (*) >90 mL/min      Studies/Results: No results found.  Medications:  Prior to Admission:  Prescriptions prior to admission  Medication Sig Dispense Refill  . ALPRAZolam (XANAX) 0.5 MG tablet Take 0.5 mg by mouth 3 (three) times daily as needed for anxiety. For anxiety.      Marland Kitchen  aMILoride (MIDAMOR) 5 MG tablet Take 10 mg by mouth daily.      Marland Kitchen amLODipine (NORVASC) 10 MG tablet Take 10 mg by mouth daily.      Marland Kitchen atenolol (TENORMIN) 50 MG tablet Take 50 mg by mouth every morning.      . Calcium-Vitamin D (CALTRATE 600 PLUS-VIT D PO) Take 1 tablet by mouth 2 (two) times daily.      . cholecalciferol (VITAMIN D) 1000 UNITS tablet Take 1,000 Units by mouth daily.      . citalopram (CELEXA) 20 MG tablet Take 20 mg by mouth daily.      . Cyanocobalamin (VITAMIN B 12 PO) Take 1,000 mg by mouth daily.      Marland Kitchen donepezil (ARICEPT) 10 MG tablet Take 10 mg by mouth daily.      . ferrous sulfate 324 (65 FE) MG TBEC Take 1 tablet by mouth 2 (two) times daily.      . Glucosamine-Chondroit-Vit C-Mn (GLUCOSAMINE CHONDR 1500 COMPLX) CAPS Take 1 capsule by mouth daily.      . potassium chloride SA (K-DUR,KLOR-CON) 20 MEQ tablet Take 20 mEq by mouth 2 (two) times daily.      Marland Kitchen  Tamsulosin HCl (FLOMAX) 0.4 MG CAPS Take 0.8 mg by mouth at bedtime.      Marland Kitchen warfarin (COUMADIN) 2 MG tablet Take 1-2 mg by mouth daily. Take 1/2 tablet (1mg ) on saturdays and sundays all other days take 1tablet (2mg )       Scheduled: . [MAR HOLD] aMILoride  10 mg Oral Daily  . [MAR HOLD] amLODipine  10 mg Oral Daily  . [facility] HOLD] atenolol  50 mg Oral q morning - 10a  . [facility] HOLD] ciprofloxacin  250 mg Oral BID  . [facility] HOLD] citalopram  20 mg Oral Daily  . [MAR HOLD] donepezil  10 mg Oral Daily  . [MAR HOLD] feeding supplement (ENSURE COMPLETE)  237 mL Oral Q2000  . [facility] HOLD] ferrous sulfate  325 mg Oral BID WC  . [facility] HOLD] LORazepam  0.5 mg Intravenous Once  . pantoprazole  40 mg Oral Daily  . [facility] HOLD] potassium chloride  20 mEq Oral BID  . [MAR HOLD] sodium chloride  3 mL Intravenous Q12H  . [facility] HOLD] tamsulosin  0.8 mg Oral QHS   Continuous: . sodium chloride 500 mL (02/16/13 0834)   PRN:[MAR HOLD] ALPRAZolam, [MAR HOLD] ondansetron (ZOFRAN) IV, [MAR HOLD] ondansetron  Assessment/Plan: Elderly male,  on chronic Coumadin for B/L DVT presenting with dark stools. Of note he takes iron and stools are always dark. H&H has dropped some after receiving IV fluids.patient has been seen by GI.he underwent endoscopy today, results as stated above antral erythema and friability probably related to NSAIDs which the wife has been giving him. I have reiterated to her to avoid all NSAIDs. Disposition to home pending stability after endoscopy. 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

## 2013-02-16 NOTE — H&P (View-Only) (Signed)
Subjective: No abdominal pain. Tolerating diet. No overt blood in stool.  Objective: Vital signs in last 24 hours: Temp:  [97.6 F (36.4 C)-98.3 F (36.8 C)] 98 F (36.7 C) (02/04 0300) Pulse Rate:  [58-70] 69 (02/04 1012) Resp:  [18] 18 (02/04 0300) BP: (127-155)/(68-78) 134/73 mmHg (02/04 1012) SpO2:  [95 %-96 %] 95 % (02/04 0300) Weight:  [84.55 kg (186 lb 6.4 oz)] 84.55 kg (186 lb 6.4 oz) (02/04 0300) Weight change:  Last BM Date: 02/13/13  PE: GEN:  NAD ABD:  Soft  Lab Results:  INR 1.97  CBC    Component Value Date/Time   WBC 3.8* 02/15/2013 0830   RBC 3.34* 02/15/2013 0830   HGB 10.1* 02/15/2013 0830   HCT 30.6* 02/15/2013 0830   PLT 174 02/15/2013 0830   MCV 91.6 02/15/2013 0830   MCH 30.2 02/15/2013 0830   MCHC 33.0 02/15/2013 0830   RDW 14.2 02/15/2013 0830   LYMPHSABS 1.0 02/13/2013 0400   MONOABS 0.5 02/13/2013 0400   EOSABS 0.0 02/13/2013 0400   BASOSABS 0.0 02/13/2013 0400   Assessment:  1.  Anemia.  Chronic, slight interval decrease. 2.  Dark stools, hemoccult-positive. 3.  Dysphagia, denies now, unrevealing Speech Therapy evaluation.  Plan:  1.  Continue PPI. 2.  Dr. Polite, given patient's decrease in Hgb, has asked me to do endoscopy. 3.  Will do endoscopy tomorrow, 02/16/13, reviewed with patient and wife.  Continue to hold warfarin in the meantime.   Joshua Rodgers 02/15/2013, 10:56 AM  

## 2013-02-16 NOTE — Op Note (Signed)
[facility] [place], [id number]   ENDOSCOPY PROCEDURE REPORT  PATIENT: Joshua, Rodgers  MR#: [id number] BIRTHDATE: 1923/11/28 , 89  yrs. old GENDER: Male ENDOSCOPIST: Arta Silence, MD REFERRED BY:  Seward Carol, M.D. PROCEDURE DATE:  02/16/2013 PROCEDURE:  EGD, diagnostic ASA CLASS:     Class III INDICATIONS:  dark stools, guaiac-positive stools, anemia. MEDICATIONS: Versed 2 mg IV TOPICAL ANESTHETIC: Cetacaine Spray  DESCRIPTION OF PROCEDURE: After the risks benefits and alternatives of the procedure were thoroughly explained, informed consent was obtained.  The Pentax Gastroscope [id number] endoscope was introduced through the mouth and advanced to the second portion of the duodenum. Without limitations.  The instrument was slowly withdrawn as the mucosa was fully examined.    Findings:    Normal esophagus.  Atrophic pangastritis.  Gastric erythema and mucosal  friability with mild edema in pre-pyloric antrum.  Normal pylorus.  Normal duodenum to the second portion. No old or fresh blood was seen to the extent of the examination.         The scope was then withdrawn from the patient and the procedure completed.  ENDOSCOPIC IMPRESSION:     As above.  Antral erythema and friability likely, at least in part, NSAID-mediated, and could potentially lead to hemoccult-positive stools.  RECOMMENDATIONS:     1.  Watch for potential complications of procedure. 2.  No NSAIDs. 3.  OK to resume warfarin, if clinically warranted. 4.  Protonix 40 mg po QD x 6 weeks 5.  Given age, comorbidities, and clinical situation, don't see need for colonoscopy or any other GI tract testing at the present time. 6.  Advance diet. 7.  OK to be discharged home from GI perspective. 8.  Will sign-off; please call with questions; thank you for the consult.  eSigned:  Arta Silence, MD 02/16/2013 9:45 AM   CC:

## 2013-02-16 NOTE — Interval H&P Note (Signed)
History and Physical Interval Note:  02/16/2013 9:20 AM  Joshua Rodgers  has presented today for surgery, with the diagnosis of Anemia, dark stools, hemoccult-positive stools  The various methods of treatment have been discussed with the patient and family. After consideration of risks, benefits and other options for treatment, the patient has consented to  Procedure(s): ESOPHAGOGASTRODUODENOSCOPY (EGD) (Left) as a surgical intervention .  The patient's history has been reviewed, patient examined, no change in status, stable for surgery.  I have reviewed the patient's chart and labs.  Questions were answered to the patient's satisfaction.     Joshua Rodgers  Assessment:  1.  Dark stools, guaiac-positive 2.  Anemia.  Plan:  1.  Endoscopy. 2.  Risks (bleeding, infection, bowel perforation that could require surgery, sedation-related changes in cardiopulmonary systems), benefits (identification and possible treatment of source of symptoms, exclusion of certain causes of symptoms), and alternatives (watchful waiting, radiographic imaging studies, empiric medical treatment) of upper endoscopy (EGD) were explained to patient/family in detail and patient wishes to proceed.

## 2013-02-16 NOTE — Discharge Summary (Signed)
Physician Discharge Summary  Patient ID: Joshua Rodgers MRN: [id number] DOB/AGE: 03-28-1923 80 y.o.  Admit date: 02/12/2013 Discharge date: 02/16/2013  Admission Diagnoses:  Discharge Diagnoses:  Principal Problem:   Melena Active Problems:   Lumbar stenosis   Dementia   HTN (hypertension)   History of DVT (deep vein thrombosis)   UTI (lower urinary tract infection)   Prostate cancer   Discharged Condition: stable  Hospital Course:  Patient presented to the hospital with complaint of black colored bowel movements. He also some complaint of dysphagia which is not a new complaint. In the emergency room patient was evaluated, hemoglobin was 11.7, he was Hemoccult positive, UA was abnormal consistent with a UTI. Admission was deemed necessary for further evaluation and treatment. Of note the patient's wife had been giving him NSAIDs. We've had a prolonged discussion about the necessity of avoiding this. Patient's hemoglobin trended down however there was no active bleeding he only was Hemoccult positive. His Coumadin was held. He ultimately underwent EGD which showed some antral erythema and friability probably related to NSAIDs. Patient was cleared to resume anticoagulation. In regards  patient's UA culture did show Escherichia coli. He was given initially IV Rocephin which was converted to by mouth Cipro. Patient remains afebrile, antibiotic course was completed during hospitalization. In regards to dysphagia he was seen by speech therapy he will continue his dysphagia diet. Patient has a history of chronic hypokalemia, he did  receive IV potassium,and  he will continue his oral potassium supplementation. Patient has several other chronic conditions including a history of bilateral DVT on chronic anticoagulation which we will resume and allow his INR to drift up, he has baseline dementia, hypertension, primary hyperaldosteronism, diastolic dysfunction chronic, spinal stenosis essentially  wheelchair-bound and prostate cancer. Skilled nursing facility was recommended due to patient's multiple comorbidities however patient's wife elected to continue caring for him at home.  Consults: Treatment Team:  Arta Silence, MD  Significant Diagnostic Studies:Ct Abdomen Pelvis Wo Contrast  02/12/2013   CLINICAL DATA:  Black tarry stools, rigid abdomen. Suspect urinary tract infection. Status post left renal tumor ablation.  EXAM: CT ABDOMEN AND PELVIS WITHOUT CONTRAST  TECHNIQUE: Multidetector CT imaging of the abdomen and pelvis was performed following the standard protocol without intravenous contrast.  COMPARISON:  CT of the abdomen April 15, 2007  FINDINGS: Included view of the lung bases are clear though, there is mild respiratory motion. Heart size is normal. Mild aortic annular calcifications. The included pericardium is unremarkable.  Small hiatal hernia. The stomach, small and large bowel are normal in course and caliber without inflammatory changes. Normal appendix. There may be a small component of rectal prolapse. No intraperitoneal free fluid nor free air.  The liver, spleen, pancreas, gallbladder are unremarkable for this noncontrast examination. 22 x 25 mm left adrenal nodule (9 Hounsfield units), 11 x 13 mm right adrenal nodule (-14 Hounsfield units) both of which are representative of benign adrenal adenomas.  2 mm nonobstructing right lower pole renal calculus associated with mild scarring. No hydronephrosis. No left nephrolithiasis. 10 mm cyst in right interpolar kidney is unchanged. Ureters are normal in course and caliber. Eccentric posterior urinary bladder wall thickening measures 11 mm.  Great vessels are overall normal in course and caliber with moderate calcific atherosclerosis. Small right fat containing inguinal hernia. Left pelvic iliopsoas muscle intramuscular lipoma. Moderate fat containing umbilical hernia. Severe L5-S1 degenerative disc disease. Status post L4-5  laminectomies.  IMPRESSION: Eccentric posterior urinary bladder wall thickening, and of  this could reflect cystitis, tumor could have a similar appearance. Consider cystoscopy upon resolution of acute symptoms.  No acute intra-abdominal or pelvic process.  Right lower pole renal scarring associated with a nonobstructing 2 mm calculus.   Electronically Signed   By: Elon Alas   On: 02/12/2013 23:22   Dg Chest 2 View  02/13/2013   CLINICAL DATA:  Aspiration.  EXAM: CHEST  2 VIEW  COMPARISON:  Chest radiograph November 22, 2012  FINDINGS: The cardiac silhouette remains limits of normal in size, mediastinal silhouette is nonsuspicious. Both lungs are clear. Elevated left hemidiaphragm. No pneumothorax. The visualized skeletal structures are unremarkable. Multiple EKG lines overlie the patient and may obscure subtle underlying pathology. Mild degenerative change of the thoracic spine.  IMPRESSION: No acute cardiopulmonary process.   Electronically Signed   By: Elon Alas   On: 02/13/2013 02:57      Discharge Exam: Blood pressure 177/86, pulse 67, temperature 97.7 F (36.5 C), temperature source Oral, resp. rate 24, height 5\' 11"  (1.803 m), weight 85.412 kg (188 lb 4.8 oz), SpO2 100.00%. Resp: clear to auscultation bilaterally Cardio: regular rate and rhythm GI: soft, non-tender; bowel sounds normal; no masses,  no organomegaly  Disposition: home   Future Appointments Provider Department Dept Phone   05/22/2013 11:30 AM Marcial Pacas, MD [facility] [phone]       Medication List         ALPRAZolam 0.5 MG tablet  Commonly known as:  XANAX  Take 0.5 mg by mouth 3 (three) times daily as needed for anxiety. For anxiety.     aMILoride 5 MG tablet  Commonly known as:  MIDAMOR  Take 10 mg by mouth daily.     amLODipine 10 MG tablet  Commonly known as:  NORVASC  Take 10 mg by mouth daily.     atenolol 50 MG tablet  Commonly known as:  TENORMIN  Take 50 mg by  mouth every morning.     CALTRATE 600 PLUS-VIT D PO  Take 1 tablet by mouth 2 (two) times daily.     cholecalciferol 1000 UNITS tablet  Commonly known as:  VITAMIN D  Take 1,000 Units by mouth daily.     citalopram 20 MG tablet  Commonly known as:  CELEXA  Take 20 mg by mouth daily.     donepezil 10 MG tablet  Commonly known as:  ARICEPT  Take 10 mg by mouth daily.     ferrous sulfate 324 (65 FE) MG Tbec  Take 1 tablet by mouth 2 (two) times daily.     GLUCOSAMINE CHONDR 1500 COMPLX Caps  Take 1 capsule by mouth daily.     pantoprazole 40 MG tablet  Commonly known as:  PROTONIX  Take 1 tablet (40 mg total) by mouth daily.     potassium chloride SA 20 MEQ tablet  Commonly known as:  K-DUR,KLOR-CON  Take 20 mEq by mouth 2 (two) times daily.     tamsulosin 0.4 MG Caps capsule  Commonly known as:  FLOMAX  Take 0.8 mg by mouth at bedtime.     VITAMIN B 12 PO  Take 1,000 mg by mouth daily.     warfarin 2 MG tablet  Commonly known as:  COUMADIN  Take 1-2 mg by mouth daily. Take 1/2 tablet (1mg ) on saturdays and sundays all other days take 1tablet (2mg )           Follow-up Information   Follow up with Durk Carmen D,  MD In 1 week.   Specialty:  Internal Medicine   Contact information:   [address] [place] [address] [phone]      35 minutes were spent in the discharge process of this patient. Reviewing studies, medication reconciliation and discussing case with wife Signed: Kandice Hams 02/16/2013, 12:56 PM

## 2013-02-16 NOTE — Care Management Note (Unsigned)
    Page 1 of 1   02/16/2013     10:34:42 AM   CARE MANAGEMENT NOTE 02/16/2013  Patient:  Joshua Rodgers, Joshua Rodgers   Account Number:  [id number]  Date Initiated:  02/16/2013  Documentation initiated by:  GRAVES-BIGELOW,Ahniya Mitchum  Subjective/Objective Assessment:   Pt admitted for GI Bleed. Plan for endoscopy 02-16-13. Plan is to return home with [facility] services when medically stable.     Action/Plan:   Pt in procedure at this time. Will offer choice for [facility] services.   Anticipated DC Date:  02/17/2013   Anticipated DC Plan:  [place]  CM consult      Choice offered to / List presented to:             Status of service:  In process, will continue to follow Medicare Important Message given?   (If response is "NO", the following Medicare IM given date fields will be blank) Date Medicare IM given:   Date Additional Medicare IM given:    Discharge Disposition:    Per UR Regulation:  Reviewed for med. necessity/level of care/duration of stay  If discussed at [place] of Stay Meetings, dates discussed:    Comments:

## 2013-02-16 NOTE — Progress Notes (Signed)
Physical Therapy Treatment Patient Details Name: Joshua Rodgers MRN: [id number] DOB: [birth year] Today's Date: 02/16/2013 Time: 2725-3664 PT Time Calculation (min): 16 min  PT Assessment / Plan / Recommendation  History of Present Illness Joshua Rodgers is a 80 y.o. male whom we've been asked to see for above symptoms.  Patient has history of chronic dementia, and while he has some troubles with comprehension of some questions, he is able to provide a history for me.  He describes having 2-3 black stools per day for the past couple months.  He is on iron, and I questioned him whether his stools are always dark on oral iron, and he's not sure.  Hgb is around 12, which is near (or a little higher than) his chronic baseline, dating back for a few years, upon my review of his outpatient chart.  Patient told primary team he was having dysphagia, but denies this to me.  Speech therapy has already seen the patient and recommends regular diet with thin liquids.  Patient denies abdominal pain, hematemesis, hematochezia.  Patient denies ever having had an endoscopy or colonoscopy, and I can not locate any endoscopic report either here on EPIC or on our [facility] outpatient EMR.  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  Has been taking increase in NSAIDs (naproxen) for pain of late.   PT Comments   Attempted to see if wife could transfer pt into the electric WC on her own, but multiple family members helping and would not let her try on her own.  She will have times when she is at home by herself and will not have the extra help she had today.  She reports that she has a hoyer lift at home and that it is "big and awkward". I asked her if it did not fit where she needed it to go to help him and she said it did but it was "big".  I asked if she needed extra training on how to use it and she said no that the reps came out two times to train her on how to use it.  I am afraid  that she is going to pull her husband out onto the floor because he is less able to help now than before.  She stated, "I can handle it, the neighbors can help me and the lady that comes can help (re: the aid that comes 2.5 hr/day 5 days per week).  I continue to believe that SNF is the safest place for him with his increased weakness and his wife is at risk of dropping him and hurting herself, but this is a risk they are willing to take.  Please maximize their available [facility] services at d/c since they are refusing SNF.    Follow Up Recommendations  SNF;Other (comment) (however, family taking him home )     Does the patient have the potential to tolerate intense rehabilitation    NA  Barriers to Discharge   None      Equipment Recommendations  None recommended by PT    Recommendations for Other Services   None  Frequency Min 2X/week   Progress towards PT Goals Progress towards PT goals: Progressing toward goals  Plan Current plan remains appropriate    Precautions / Restrictions Precautions Precautions: Fall   Pertinent Vitals/Pain See vitals flow sheet.    Mobility  Bed Mobility Overal bed mobility: Needs Assistance Bed Mobility: Supine to Sit Supine to  sit: Mod assist General bed mobility comments: mod assist to support trunk and move legs over EOB during transitions.  Pt using both hands to help pull up to sitting.  Transfers Overall transfer level: Needs assistance Equipment used: 2 person hand held assist Transfers: Lateral/Scoot Transfers  Lateral/Scoot Transfers: +2 physical assistance;Mod assist General transfer comment: wife and daughter pulling on bed pad and sliding pt over into the electric WC.  Pt very anterior on the seat and almost slid off, but wife able to scoot him back in the electric scooter with daughter's assist.  Feet keep coming off oc foot platform.  Pt would be better off if he had a custom electric WC. Wife reports they do have a chest strap to try to keep  him in the chair.        PT Goals (current goals can now be found in the care plan section) Acute Rehab PT Goals Patient Stated Goal: to go home with his wife  Visit Information  Last PT Received On: 02/16/13 Assistance Needed: +2 History of Present Illness: Joshua Rodgers is a 80 y.o. male whom we've been asked to see for above symptoms.  Patient has history of chronic dementia, and while he has some troubles with comprehension of some questions, he is able to provide a history for me.  He describes having 2-3 black stools per day for the past couple months.  He is on iron, and I questioned him whether his stools are always dark on oral iron, and he's not sure.  Hgb is around 12, which is near (or a little higher than) his chronic baseline, dating back for a few years, upon my review of his outpatient chart.  Patient told primary team he was having dysphagia, but denies this to me.  Speech therapy has already seen the patient and recommends regular diet with thin liquids.  Patient denies abdominal pain, hematemesis, hematochezia.  Patient denies ever having had an endoscopy or colonoscopy, and I can not locate any endoscopic report either here on EPIC or on our [facility] outpatient EMR.  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  Has been taking increase in NSAIDs (naproxen) for pain of late.    Subjective Data  Subjective: Wife reports she does not use the hoyer lift at home because it is too big and awkward.   Patient Stated Goal: to go home with his wife   Cognition  Cognition Arousal/Alertness: Awake/alert Behavior During Therapy: WFL for tasks assessed/performed Overall Cognitive Status: History of cognitive impairments - at baseline    Balance  Balance Sitting-balance support: Feet supported;Bilateral upper extremity supported Sitting balance-Leahy Scale: Poor Sitting balance - Comments: leands left and posteriorly in sitting.   Postural  control: Posterior lean;Left lateral lean  End of Session PT - End of Session Activity Tolerance: Patient limited by fatigue Patient left: in chair;with family/visitor present Nurse Communication: Other (comment) (pt leaving to d/c home.  )    Joshua Rodgers, [place], DPT [phone]   02/16/2013, 5:29 PM

## 2013-02-17 ENCOUNTER — Encounter (HOSPITAL_COMMUNITY): Payer: Self-pay | Admitting: Gastroenterology

## 2013-03-28 ENCOUNTER — Other Ambulatory Visit: Payer: Self-pay | Admitting: *Deleted

## 2013-03-28 DIAGNOSIS — I499 Cardiac arrhythmia, unspecified: Secondary | ICD-10-CM

## 2013-04-03 ENCOUNTER — Encounter: Payer: Self-pay | Admitting: *Deleted

## 2013-04-03 ENCOUNTER — Encounter (INDEPENDENT_AMBULATORY_CARE_PROVIDER_SITE_OTHER): Payer: Medicare PPO

## 2013-04-03 DIAGNOSIS — I499 Cardiac arrhythmia, unspecified: Secondary | ICD-10-CM

## 2013-04-03 NOTE — Progress Notes (Signed)
Patient ID: Joshua Rodgers, male   DOB: 23-Jun-1923, 80 y.o.   MRN: [id number] E-Cardio 24 hour holter monitor applied to patient.

## 2013-04-25 ENCOUNTER — Encounter: Payer: Self-pay | Admitting: Cardiology

## 2013-04-25 NOTE — Telephone Encounter (Signed)
This encounter was created in error - please disregard.

## 2013-04-27 ENCOUNTER — Telehealth: Payer: Self-pay | Admitting: Cardiology

## 2013-04-27 NOTE — Telephone Encounter (Signed)
See other note/ pt was informed and will be called by Dr Delfina Redwood.

## 2013-04-27 NOTE — Telephone Encounter (Signed)
New problem   Pt returning call.

## 2013-04-27 NOTE — Telephone Encounter (Signed)
Spoke with Joshua Rodgers/ Dr Lina Sar office and informed her that Dr Radford Pax read the pt's monitor, PAF/NSR/PVC's with rates from 151- an average of 104 BPM. It was advised that the pt be seen soon/ this week to evaluate and adjust his meds. She verbalized understanding will inform Dr Delfina Redwood of recommendations. The pt has never been seen here/ Dr Delfina Redwood placed the order.

## 2013-04-27 NOTE — Telephone Encounter (Signed)
Per Dr Radford Pax, pt to be seen in next 3-4 days. Pts monitor was reviewed by Dr Radford Pax placed by order from Dr Delfina Redwood. Monitor showed episodes of PAF/NSR PVC's with rates from 151- average 104 BPM.  [facility]/ Dr Delfina Redwood,

## 2013-04-27 NOTE — Telephone Encounter (Signed)
Follow up      Returning Joshua Rodgers's call

## 2013-04-27 NOTE — Telephone Encounter (Signed)
Please let patient know that heart monitor showed episodes of PAF and NSR with PVC's.  Please get her in to see a Cardiologist this week for further evaluation.  She has never been seen in this practice. Please forward to Dr. Delfina Redwood

## 2013-05-22 ENCOUNTER — Ambulatory Visit: Payer: 59 | Admitting: Neurology

## 2013-05-25 ENCOUNTER — Ambulatory Visit: Payer: Medicare PPO | Admitting: Cardiology

## 2013-05-31 ENCOUNTER — Encounter: Payer: Self-pay | Admitting: General Surgery

## 2013-05-31 DIAGNOSIS — I499 Cardiac arrhythmia, unspecified: Secondary | ICD-10-CM | POA: Insufficient documentation

## 2013-06-01 ENCOUNTER — Encounter: Payer: Self-pay | Admitting: Cardiology

## 2013-06-01 ENCOUNTER — Ambulatory Visit (INDEPENDENT_AMBULATORY_CARE_PROVIDER_SITE_OTHER): Payer: Medicare PPO | Admitting: Cardiology

## 2013-06-01 VITALS — BP 134/83 | HR 53 | Ht 71.0 in | Wt 189.0 lb

## 2013-06-01 DIAGNOSIS — I4891 Unspecified atrial fibrillation: Secondary | ICD-10-CM

## 2013-06-01 DIAGNOSIS — I509 Heart failure, unspecified: Secondary | ICD-10-CM

## 2013-06-01 DIAGNOSIS — I359 Nonrheumatic aortic valve disorder, unspecified: Secondary | ICD-10-CM

## 2013-06-01 DIAGNOSIS — I350 Nonrheumatic aortic (valve) stenosis: Secondary | ICD-10-CM | POA: Insufficient documentation

## 2013-06-01 DIAGNOSIS — I77810 Thoracic aortic ectasia: Secondary | ICD-10-CM

## 2013-06-01 DIAGNOSIS — R011 Cardiac murmur, unspecified: Secondary | ICD-10-CM

## 2013-06-01 DIAGNOSIS — I10 Essential (primary) hypertension: Secondary | ICD-10-CM

## 2013-06-01 DIAGNOSIS — I480 Paroxysmal atrial fibrillation: Secondary | ICD-10-CM

## 2013-06-01 DIAGNOSIS — I5032 Chronic diastolic (congestive) heart failure: Secondary | ICD-10-CM

## 2013-06-01 NOTE — Patient Instructions (Addendum)
Your physician recommends that you continue on your current medications as directed. Please refer to the Current Medication list given to you today.  Your physician has requested that you have an echocardiogram. Echocardiography is a painless test that uses sound waves to create images of your heart. It provides your doctor with information about the size and shape of your heart and how well your heart's chambers and valves are working. This procedure takes approximately one hour. There are no restrictions for this procedure.  Your physician wants you to follow-up in: 6 months with Dr Mallie Snooks will receive a reminder letter in the mail two months in advance. If you don't receive a letter, please call our office to schedule the follow-up appointment.

## 2013-06-01 NOTE — Progress Notes (Signed)
[place], [address] [place], [place]  [address] Phone: [phone] Fax:  [phone]  Date:  06/01/2013   ID:  Joshua Rodgers, DOB 10/27/1923, MRN [id number]  PCP:  Kandice Hams, MD  Cardiologist:  Fransico Him, MD     History of Present Illness: Joshua Rodgers is a 80 y.o. male with a history of diastolic CHF, HTN, DVT on chronic coumadin, dementia and recent admission for melena to [facility] at which time endoscopy showed friable tissue in the antrum of his stomach .  Coumadin was initially held but then restarted.  Dr.Polite ordered a heart monitor due to syncope recently which showed paroxysmal atrial fibrillation.  According to his wife he has passed out twice.  Both episodes occurred while he was sitting down.  His wife said his eyes were glazed over and would not speak lasting about 1 minute and it resolved.  He denies and chest pain or pressure.  He denies and SOB.  He has chronic LE edema secondary to chronic DVT's.  He denies any palpitations.  He does have dizziness when getting him in and out of bed.  He was on a beta blocker in the past but it was stopped due to low BP.     Wt Readings from Last 3 Encounters:  06/01/13 189 lb (85.73 kg)  02/16/13 188 lb 4.8 oz (85.412 kg)  02/16/13 188 lb 4.8 oz (85.412 kg)     Past Medical History  Diagnosis Date  . Generalized weakness   . Hypokalemia   . Renal disorder   . Hyperlipidemia   . Vitamin D deficiency   . Bell's palsy   . Lumbar stenosis   . Hypertension   . Depression   . Dementia   . Prostate cancer ~ 2004    "had radiation tx" (11/24/2012)  . DVT (deep venous thrombosis) 11/24/2012    Acute DVT in the rightcommon femoral vein and acute DVT involving the leftcommon femorall,profinda popliteal and posterior tibial vein 2013. Pt seen by hematology in 2014. W/S revealed continued clot in left leg  . Asthma     "as a child, real bad" (11/24/2012)  . History of bleeding ulcers ~ 1956    "in hospital for ~ 1 month"  (11/24/2012)  . Recurrent UTI (urinary tract infection) 2013-now    "lots" (11/24/2012)  . DJD (degenerative joint disease)   . Arthritis     "joints" (11/24/2012)  . Anxiety   . Hyperaldosteronism   . CHF (congestive heart failure)     Diastolic dysfunction  . Vitamin B12 deficiency   . Spinal stenosis     Dr Christella Noa  . Dilated aortic root   . Mild aortic stenosis     echo 2012    Current Outpatient Prescriptions  Medication Sig Dispense Refill  . ALPRAZolam (XANAX) 0.5 MG tablet Take 0.5 mg by mouth 3 (three) times daily as needed for anxiety. For anxiety.      Marland Kitchen aMILoride (MIDAMOR) 5 MG tablet Take 10 mg by mouth daily.      Marland Kitchen amLODipine (NORVASC) 10 MG tablet Take 10 mg by mouth daily.      . Calcium-Vitamin D (CALTRATE 600 PLUS-VIT D PO) Take 1 tablet by mouth 2 (two) times daily.      . cholecalciferol (VITAMIN D) 1000 UNITS tablet Take 1,000 Units by mouth daily.      . citalopram (CELEXA) 20 MG tablet Take 20 mg by mouth daily.      Marland Kitchen  Cyanocobalamin (VITAMIN B 12 PO) Take 1,000 mg by mouth daily.      . ferrous sulfate 324 (65 FE) MG TBEC Take 1 tablet by mouth 2 (two) times daily.      . Glucosamine-Chondroit-Vit C-Mn (GLUCOSAMINE CHONDR 1500 COMPLX) CAPS Take 1 capsule by mouth daily.      . pantoprazole (PROTONIX) 40 MG tablet Take 1 tablet (40 mg total) by mouth daily.  30 tablet  1  . potassium chloride SA (K-DUR,KLOR-CON) 20 MEQ tablet Take 20 mEq by mouth 2 (two) times daily.      . Tamsulosin HCl (FLOMAX) 0.4 MG CAPS Take 0.8 mg by mouth at bedtime.      Marland Kitchen warfarin (COUMADIN) 2 MG tablet Take 1-2 mg by mouth daily. Take 1/2 tablet (1mg ) on saturdays and sundays all other days take 1tablet (2mg )       No current facility-administered medications for this visit.    Allergies:    Allergies  Allergen Reactions  . Contrast Media [Iodinated Diagnostic Agents]     Pt blacked out  . Iohexol      Desc: sob, throat swelling (1988 gdc) pt requires full premeds and does  well, JB 8/14/7     Social History:  The patient  reports that he has quit smoking. His smoking use included Cigarettes. He smoked 0.00 packs per day for 10 years. He has never used smokeless tobacco. He reports that he drinks alcohol. He reports that he does not use illicit drugs.   Family History:  The patient's family history includes Diabetes in his father; Heart disease in his mother.   ROS:  Please see the history of present illness.      All other systems reviewed and negative.   PHYSICAL EXAM: VS:  BP 134/83  Pulse 53  Ht 5\' 11"  (1.803 m)  Wt 189 lb (85.73 kg)  BMI 26.37 kg/m2 Well nourished, well developed, in no acute distress HEENT: normal Neck: no JVD Cardiac:  normal S1, S2; RRR; 2/6 SM at RUSB to LLSB Lungs:  clear to auscultation bilaterally, no wheezing, rhonchi or rales Abd: soft, nontender, no hepatomegaly Ext: pedal edema bilaterally Skin: warm and dry Neuro:  CNs 2-12 intact, no focal abnormalities noted       ASSESSMENT AND PLAN:  1. Paroxysmal atrial fibrillation which he is asymptomatic from.  He is chronically anticoagulated on Coumadin for DVT.  I would continue on current medical therapy since he is asymptomatic  His HR is controlled today.  He has not tolerated beta blockers in the past due to bradycardia and hypotension.   2. Heart murmur with history of mild AS 3. Chronic diastolic CHF- appears compensated 4. HTN- well controlled  Followup with me in 6 months  Signed, Fransico Him, MD 06/01/2013 2:25 PM

## 2013-06-09 ENCOUNTER — Encounter (HOSPITAL_BASED_OUTPATIENT_CLINIC_OR_DEPARTMENT_OTHER): Payer: Medicare PPO | Attending: General Surgery

## 2013-06-09 DIAGNOSIS — Z86718 Personal history of other venous thrombosis and embolism: Secondary | ICD-10-CM | POA: Insufficient documentation

## 2013-06-09 DIAGNOSIS — L8991 Pressure ulcer of unspecified site, stage 1: Secondary | ICD-10-CM | POA: Insufficient documentation

## 2013-06-09 DIAGNOSIS — Z936 Other artificial openings of urinary tract status: Secondary | ICD-10-CM | POA: Insufficient documentation

## 2013-06-09 DIAGNOSIS — Z7401 Bed confinement status: Secondary | ICD-10-CM | POA: Insufficient documentation

## 2013-06-09 DIAGNOSIS — L89609 Pressure ulcer of unspecified heel, unspecified stage: Secondary | ICD-10-CM | POA: Insufficient documentation

## 2013-06-09 DIAGNOSIS — Z79899 Other long term (current) drug therapy: Secondary | ICD-10-CM | POA: Insufficient documentation

## 2013-06-09 DIAGNOSIS — I10 Essential (primary) hypertension: Secondary | ICD-10-CM | POA: Insufficient documentation

## 2013-06-09 DIAGNOSIS — C675 Malignant neoplasm of bladder neck: Secondary | ICD-10-CM | POA: Insufficient documentation

## 2013-06-09 DIAGNOSIS — Z7901 Long term (current) use of anticoagulants: Secondary | ICD-10-CM | POA: Insufficient documentation

## 2013-06-10 NOTE — Progress Notes (Signed)
[facility]  NAME:  KIMBLE, HITCHENS NO.:  [id number]  MEDICAL RECORD NO.:  [id number]      DATE OF BIRTH:  1923/12/04  PHYSICIAN:  Judene Companion, M.D.           VISIT DATE:                                  OFFICE VISIT   This is a very debilitated 80 year old African American male who comes to [place] because of a pressure ulcer on the right heel.  This is quite early.  I just call it a stage I, and today when we examined him, he had a blood pressure 123/79, temperature 97, and he weighs 189 pounds.  He as I stated is very debilitated.  He has an indwelling catheter.  He has a history of hypertension and he is on many medicines including, amlodipine, amiloride, atenolol, calcium, Celexa, vitamins, ferrous sulfate, potassium chloride, and Coumadin 2 mg a day.  He states that he has had a history of a stroke, carcinoma of the prostate, hypertension, lumbar stenosis.  He has had back surgery and a right knee replaced.  He is totally bed ridden at this time.  I thought his right heel had a very small superficial ulcer from pressure.  We put him in a Podus boot and I think that should take care of it.  DIAGNOSES: 1. Pressure ulcer, right heel. 2  Hypertension. 1. Bedridden. 2. Indwelling catheter. 3. Carcinoma of the prostate. 4. History of blood clots in both of his legs.     Judene Companion, M.D.     PP/MEDQ  D:  06/09/2013  T:  06/10/2013  Job:  [id number]

## 2013-06-19 ENCOUNTER — Inpatient Hospital Stay (HOSPITAL_COMMUNITY)
Admission: EM | Admit: 2013-06-19 | Discharge: 2013-06-22 | DRG: 690 | Disposition: A | Discharge status: Home / Self Care | Payer: Medicare PPO | Attending: Internal Medicine | Admitting: Internal Medicine

## 2013-06-19 ENCOUNTER — Emergency Department (HOSPITAL_COMMUNITY): Payer: Medicare PPO

## 2013-06-19 ENCOUNTER — Encounter (HOSPITAL_COMMUNITY): Payer: Self-pay | Admitting: Emergency Medicine

## 2013-06-19 DIAGNOSIS — Z96659 Presence of unspecified artificial knee joint: Secondary | ICD-10-CM

## 2013-06-19 DIAGNOSIS — I498 Other specified cardiac arrhythmias: Secondary | ICD-10-CM | POA: Diagnosis present

## 2013-06-19 DIAGNOSIS — Z923 Personal history of irradiation: Secondary | ICD-10-CM

## 2013-06-19 DIAGNOSIS — I359 Nonrheumatic aortic valve disorder, unspecified: Secondary | ICD-10-CM | POA: Diagnosis present

## 2013-06-19 DIAGNOSIS — Z79899 Other long term (current) drug therapy: Secondary | ICD-10-CM

## 2013-06-19 DIAGNOSIS — I4891 Unspecified atrial fibrillation: Secondary | ICD-10-CM | POA: Diagnosis present

## 2013-06-19 DIAGNOSIS — L89609 Pressure ulcer of unspecified heel, unspecified stage: Secondary | ICD-10-CM | POA: Diagnosis present

## 2013-06-19 DIAGNOSIS — I5032 Chronic diastolic (congestive) heart failure: Secondary | ICD-10-CM | POA: Diagnosis present

## 2013-06-19 DIAGNOSIS — Z7901 Long term (current) use of anticoagulants: Secondary | ICD-10-CM

## 2013-06-19 DIAGNOSIS — N390 Urinary tract infection, site not specified: Principal | ICD-10-CM | POA: Diagnosis present

## 2013-06-19 DIAGNOSIS — R000 Tachycardia, unspecified: Secondary | ICD-10-CM

## 2013-06-19 DIAGNOSIS — Z8546 Personal history of malignant neoplasm of prostate: Secondary | ICD-10-CM

## 2013-06-19 DIAGNOSIS — Z86718 Personal history of other venous thrombosis and embolism: Secondary | ICD-10-CM

## 2013-06-19 DIAGNOSIS — N183 Chronic kidney disease, stage 3 unspecified: Secondary | ICD-10-CM | POA: Diagnosis present

## 2013-06-19 DIAGNOSIS — N312 Flaccid neuropathic bladder, not elsewhere classified: Secondary | ICD-10-CM | POA: Diagnosis present

## 2013-06-19 DIAGNOSIS — I959 Hypotension, unspecified: Secondary | ICD-10-CM | POA: Diagnosis present

## 2013-06-19 DIAGNOSIS — Z87891 Personal history of nicotine dependence: Secondary | ICD-10-CM

## 2013-06-19 DIAGNOSIS — L8990 Pressure ulcer of unspecified site, unspecified stage: Secondary | ICD-10-CM | POA: Diagnosis present

## 2013-06-19 DIAGNOSIS — R339 Retention of urine, unspecified: Secondary | ICD-10-CM | POA: Diagnosis present

## 2013-06-19 DIAGNOSIS — I509 Heart failure, unspecified: Secondary | ICD-10-CM | POA: Diagnosis present

## 2013-06-19 DIAGNOSIS — F0390 Unspecified dementia without behavioral disturbance: Secondary | ICD-10-CM | POA: Diagnosis present

## 2013-06-19 DIAGNOSIS — I350 Nonrheumatic aortic (valve) stenosis: Secondary | ICD-10-CM | POA: Diagnosis present

## 2013-06-19 DIAGNOSIS — E269 Hyperaldosteronism, unspecified: Secondary | ICD-10-CM | POA: Diagnosis present

## 2013-06-19 LAB — BASIC METABOLIC PANEL
BUN: 18 mg/dL (ref 6–23)
CHLORIDE: 105 meq/L (ref 96–112)
CO2: 28 mEq/L (ref 19–32)
Calcium: 9.5 mg/dL (ref 8.4–10.5)
Creatinine, Ser: 1.26 mg/dL (ref 0.50–1.35)
GFR calc non Af Amer: 49 mL/min — ABNORMAL LOW (ref >90)
GFR, EST AFRICAN AMERICAN: 57 mL/min — AB (ref >90)
GLUCOSE: 97 mg/dL (ref 70–99)
Potassium: 3.8 mEq/L (ref 3.7–5.3)
Sodium: 145 mEq/L (ref 137–147)

## 2013-06-19 LAB — PROTIME-INR
INR: 2.27 — ABNORMAL HIGH (ref 0.00–1.49)
Prothrombin Time: 24.3 seconds — ABNORMAL HIGH (ref 11.6–15.2)

## 2013-06-19 LAB — URINALYSIS, ROUTINE W REFLEX MICROSCOPIC
BILIRUBIN URINE: NEGATIVE
Glucose, UA: NEGATIVE mg/dL
KETONES UR: NEGATIVE mg/dL
NITRITE: POSITIVE — AB
PH: 5 (ref 5.0–8.0)
Protein, ur: NEGATIVE mg/dL
Specific Gravity, Urine: 1.021 (ref 1.005–1.030)
Urobilinogen, UA: 0.2 mg/dL (ref 0.0–1.0)

## 2013-06-19 LAB — URINE MICROSCOPIC-ADD ON

## 2013-06-19 LAB — CBC WITH DIFFERENTIAL/PLATELET
BASOS ABS: 0 10*3/uL (ref 0.0–0.1)
Basophils Relative: 0 % (ref 0–1)
Eosinophils Absolute: 0 10*3/uL (ref 0.0–0.7)
Eosinophils Relative: 0 % (ref 0–5)
HCT: 37.7 % — ABNORMAL LOW (ref 39.0–52.0)
HEMOGLOBIN: 12 g/dL — AB (ref 13.0–17.0)
Lymphocytes Relative: 11 % — ABNORMAL LOW (ref 12–46)
Lymphs Abs: 0.6 10*3/uL — ABNORMAL LOW (ref 0.7–4.0)
MCH: 29.2 pg (ref 26.0–34.0)
MCHC: 31.8 g/dL (ref 30.0–36.0)
MCV: 91.7 fL (ref 78.0–100.0)
Monocytes Absolute: 0.6 10*3/uL (ref 0.1–1.0)
Monocytes Relative: 10 % (ref 3–12)
NEUTROS ABS: 4.8 10*3/uL (ref 1.7–7.7)
NEUTROS PCT: 79 % — AB (ref 43–77)
PLATELETS: 217 10*3/uL (ref 150–400)
RBC: 4.11 MIL/uL — ABNORMAL LOW (ref 4.22–5.81)
RDW: 15.3 % (ref 11.5–15.5)
WBC: 6 10*3/uL (ref 4.0–10.5)

## 2013-06-19 LAB — TROPONIN I: Troponin I: <0.3 ng/mL (ref <0.30)

## 2013-06-19 MED ORDER — DEXTROSE 5 % IV SOLN
1.0000 g | INTRAVENOUS | Status: DC
  Administered 2013-06-19: 1 g via INTRAVENOUS
  Filled 2013-06-19: qty 10

## 2013-06-19 MED ORDER — FERROUS SULFATE 325 (65 FE) MG PO TABS
325.0000 mg | ORAL_TABLET | Freq: Every day | ORAL | Status: DC
  Administered 2013-06-20 – 2013-06-22 (×3): 325 mg via ORAL
  Filled 2013-06-19 (×4): qty 1

## 2013-06-19 MED ORDER — VITAMIN D3 25 MCG (1000 UNIT) PO TABS
1000.0000 [IU] | ORAL_TABLET | Freq: Every day | ORAL | Status: DC
  Administered 2013-06-20 – 2013-06-22 (×3): 1000 [IU] via ORAL
  Filled 2013-06-19 (×3): qty 1

## 2013-06-19 MED ORDER — CITALOPRAM HYDROBROMIDE 20 MG PO TABS
20.0000 mg | ORAL_TABLET | Freq: Every day | ORAL | Status: DC
  Administered 2013-06-20 – 2013-06-22 (×3): 20 mg via ORAL
  Filled 2013-06-19 (×3): qty 1

## 2013-06-19 MED ORDER — FERROUS SULFATE 324 (65 FE) MG PO TBEC: 1.0000 | DELAYED_RELEASE_TABLET | Freq: Every day | ORAL | Status: DC

## 2013-06-19 MED ORDER — SODIUM CHLORIDE 0.9 % IV BOLUS (SEPSIS)
500.0000 mL | Freq: Once | INTRAVENOUS | Status: AC
  Administered 2013-06-19: 500 mL via INTRAVENOUS

## 2013-06-19 MED ORDER — ACETAMINOPHEN 325 MG PO TABS: 650.0000 mg | ORAL_TABLET | Freq: Four times a day (QID) | ORAL | Status: DC | PRN

## 2013-06-19 MED ORDER — TAMSULOSIN HCL 0.4 MG PO CAPS
0.8000 mg | ORAL_CAPSULE | Freq: Every day | ORAL | Status: DC
  Administered 2013-06-20 – 2013-06-21 (×2): 0.8 mg via ORAL
  Filled 2013-06-19 (×4): qty 2

## 2013-06-19 MED ORDER — PIPERACILLIN-TAZOBACTAM 3.375 G IVPB
3.3750 g | Freq: Three times a day (TID) | INTRAVENOUS | Status: DC
  Administered 2013-06-19 – 2013-06-22 (×8): 3.375 g via INTRAVENOUS
  Filled 2013-06-19 (×11): qty 50

## 2013-06-19 MED ORDER — SODIUM CHLORIDE 0.9 % IV SOLN
INTRAVENOUS | Status: DC
  Administered 2013-06-19 – 2013-06-20 (×3): via INTRAVENOUS

## 2013-06-19 MED ORDER — AMLODIPINE BESYLATE 10 MG PO TABS
10.0000 mg | ORAL_TABLET | Freq: Every day | ORAL | Status: DC
  Administered 2013-06-20 – 2013-06-21 (×2): 10 mg via ORAL
  Filled 2013-06-19 (×2): qty 1

## 2013-06-19 MED ORDER — SODIUM CHLORIDE 0.9 % IJ SOLN
3.0000 mL | Freq: Two times a day (BID) | INTRAMUSCULAR | Status: DC
  Administered 2013-06-20 – 2013-06-21 (×3): 3 mL via INTRAVENOUS

## 2013-06-19 MED ORDER — PANTOPRAZOLE SODIUM 40 MG PO TBEC
40.0000 mg | DELAYED_RELEASE_TABLET | Freq: Every day | ORAL | Status: DC
  Administered 2013-06-20 – 2013-06-22 (×3): 40 mg via ORAL
  Filled 2013-06-19 (×3): qty 1

## 2013-06-19 MED ORDER — WARFARIN - PHARMACIST DOSING INPATIENT
Freq: Every day | Status: DC
  Administered 2013-06-19: 18:00:00

## 2013-06-19 MED ORDER — POTASSIUM CHLORIDE CRYS ER 20 MEQ PO TBCR
20.0000 meq | EXTENDED_RELEASE_TABLET | Freq: Two times a day (BID) | ORAL | Status: DC
  Administered 2013-06-20 – 2013-06-22 (×5): 20 meq via ORAL
  Filled 2013-06-19 (×9): qty 1

## 2013-06-19 MED ORDER — WARFARIN SODIUM 2 MG PO TABS
2.0000 mg | ORAL_TABLET | Freq: Once | ORAL | Status: AC
  Administered 2013-06-19: 2 mg via ORAL
  Filled 2013-06-19 (×2): qty 1

## 2013-06-19 MED ORDER — AMILORIDE HCL 5 MG PO TABS
10.0000 mg | ORAL_TABLET | Freq: Every day | ORAL | Status: DC
  Administered 2013-06-20 – 2013-06-22 (×3): 10 mg via ORAL
  Filled 2013-06-19 (×3): qty 2

## 2013-06-19 MED ORDER — ACETAMINOPHEN 650 MG RE SUPP: 650.0000 mg | Freq: Four times a day (QID) | RECTAL | Status: DC | PRN

## 2013-06-19 MED ORDER — ALPRAZOLAM 0.5 MG PO TABS: 0.5000 mg | ORAL_TABLET | Freq: Three times a day (TID) | ORAL | Status: DC | PRN

## 2013-06-19 NOTE — Progress Notes (Addendum)
ANTIBIOTIC CONSULT NOTE - INITIAL  Pharmacy Consult for Zosyn Indication: UTI  Allergies  Allergen Reactions  . Contrast Media [Iodinated Diagnostic Agents]     Pt blacked out  . Iohexol      Desc: sob, throat swelling (1988 gdc) pt requires full premeds and does well, JB 8/14/7     Patient Measurements: Height: 5\' 11"  (180.3 cm) Weight: 189 lb (85.73 kg) IBW/kg (Calculated) : 75.3  Vital Signs: Temp: 98.2 F (36.8 C) (06/08 1045) Temp src: Oral (06/08 1045) BP: 143/98 mmHg (06/08 1645) Pulse Rate: 121 (06/08 1445) Intake/Output from previous day:   Intake/Output from this shift:    Labs:  Recent Labs  06/19/13 1120  WBC 6.0  HGB 12.0*  PLT 217  CREATININE 1.26   Estimated Creatinine Clearance: 44 ml/min (by C-G formula based on Cr of 1.26). No results found for this basename: VANCOTROUGH, VANCOPEAK, VANCORANDOM, GENTTROUGH, GENTPEAK, GENTRANDOM, TOBRATROUGH, TOBRAPEAK, TOBRARND, AMIKACINPEAK, AMIKACINTROU, AMIKACIN,  in the last 72 hours   Microbiology: No results found for this or any previous visit (from the past 720 hour(s)).  Medical History: Past Medical History  Diagnosis Date  . Generalized weakness   . Hypokalemia   . Renal disorder   . Hyperlipidemia   . Vitamin D deficiency   . Bell's palsy   . Lumbar stenosis   . Hypertension   . Depression   . Dementia   . Prostate cancer ~ 2004    "had radiation tx" (11/24/2012)  . DVT (deep venous thrombosis) 11/24/2012    Acute DVT in the rightcommon femoral vein and acute DVT involving the leftcommon femorall,profinda popliteal and posterior tibial vein 2013. Pt seen by hematology in 2014. W/S revealed continued clot in left leg  . Asthma     "as a child, real bad" (11/24/2012)  . History of bleeding ulcers ~ 1956    "in hospital for ~ 1 month" (11/24/2012)  . Recurrent UTI (urinary tract infection) 2013-now    "lots" (11/24/2012)  . DJD (degenerative joint disease)   . Arthritis     "joints"  (11/24/2012)  . Anxiety   . Hyperaldosteronism   . CHF (congestive heart failure)     Diastolic dysfunction  . Vitamin B12 deficiency   . Spinal stenosis     Dr Christella Noa  . Dilated aortic root   . Mild aortic stenosis     echo 2012    Medications:  See PTA medication list  Assessment: 80 y/o male who presents to the ED with worsening urinary retention over the last 2 weeks and abdominal pain. He was recently treated with Levaquin 250 mg daily for 5 days (6/2) however culture came back with Pseudomonas resistant to Levaquin. Pharmacy consulted to begin Zosyn for UTI. UA is dirty. Renal function is normal, WBC are normal, and he is afebrile. Repeat urine culture is pending.  Outpatient UCx per Dr. Gaynelle Adu note - Pseudomonas: S-Zosyn, imipenem, ceftaz, cefepime, gent, tobra; R-Levaquin  Goal of Therapy:  Eradication of infection  Plan:  - Zosyn 3.375 g IV q8h to be infused over 4 hours - Monitor renal function, clinical course, and culture data  [facility], [place].D., BCPS Clinical Pharmacist Pager: [phone] 06/19/2013 5:01 PM   Addendum:  Pharmacy also consulted to manage Coumadin for hx DVT and Afib. INR is therapeutic at 2.27. No bleeding noted, Hb 12, platelets 217.  Home regimen is Coumadin 2 mg at bedtime. Last dose was 6/7.  Plan: -Coumadin 2 mg PO tonight -INR daily  [place], Pharm.D., BCPS Clinical Pharmacist Pager: [phone] 06/19/2013 5:17 PM

## 2013-06-19 NOTE — ED Notes (Signed)
Pt has been having urinary retention for two weeks, the family has been trying to work through this, but today pt reports the worse symptoms with little output and abdominal pain.

## 2013-06-19 NOTE — Progress Notes (Signed)
Pt received into room 2w08, family at bedside, pt oriented to room and call bell, tele placed on pt, pt states no pain at this time, will continue to monitor Rickard Rhymes, RN

## 2013-06-19 NOTE — H&P (Signed)
[facility] History and Physical  Joshua FEDERICI WER:[id number] DOB: 08/23/1923 DOA: 06/19/2013  Referring physician: er PCP: Kandice Hams, MD   Chief Complaint: urinary retention  HPI: Joshua Rodgers is a 80 y.o. male  With urinary retention for 2 week.  Saw Dr. Kellie Simmering- given levaquin 250 mg x 5 days.  Culture came back pseudomonas and resistant to levaquin.  Patient presents to ER now with worsening retention and abdominal pain.  States he has had trouble starting or strain. Patient denies fever or chills.  In the er, he was found to have a dirty U/A.  He was mildly tachycardic.  He had a foley placed and was given gentle IVF.  Renal function was fine.   Review of Systems:  All systems reviewed, negative unless stated above   Past Medical History  Diagnosis Date  . Generalized weakness   . Hypokalemia   . Renal disorder   . Hyperlipidemia   . Vitamin D deficiency   . Bell's palsy   . Lumbar stenosis   . Hypertension   . Depression   . Dementia   . Prostate cancer ~ 2004    "had radiation tx" (11/24/2012)  . DVT (deep venous thrombosis) 11/24/2012    Acute DVT in the rightcommon femoral vein and acute DVT involving the leftcommon femorall,profinda popliteal and posterior tibial vein 2013. Pt seen by hematology in 2014. W/S revealed continued clot in left leg  . Asthma     "as a child, real bad" (11/24/2012)  . History of bleeding ulcers ~ 1956    "in hospital for ~ 1 month" (11/24/2012)  . Recurrent UTI (urinary tract infection) 2013-now    "lots" (11/24/2012)  . DJD (degenerative joint disease)   . Arthritis     "joints" (11/24/2012)  . Anxiety   . Hyperaldosteronism   . CHF (congestive heart failure)     Diastolic dysfunction  . Vitamin B12 deficiency   . Spinal stenosis     Dr Christella Noa  . Dilated aortic root   . Mild aortic stenosis     echo 2012   Past Surgical History  Procedure Laterality Date  . Total knee arthroplasty Left 02/2010  . Back  surgery  2008    "for stenosis" (11/24/2012)  . Esophagogastroduodenoscopy Left 02/16/2013    Procedure: ESOPHAGOGASTRODUODENOSCOPY (EGD);  Surgeon: Arta Silence, MD;  Location: [facility] ENDOSCOPY;  Service: Endoscopy;  Laterality: Left;   Social History:  reports that he has quit smoking. His smoking use included Cigarettes. He smoked 0.00 packs per day for 10 years. He has never used smokeless tobacco. He reports that he drinks alcohol. He reports that he does not use illicit drugs.  Allergies  Allergen Reactions  . Contrast Media [Iodinated Diagnostic Agents]     Pt blacked out  . Iohexol      Desc: sob, throat swelling (1988 gdc) pt requires full premeds and does well, JB 8/14/7     Family History  Problem Relation Age of Onset  . Heart disease Mother   . Diabetes Father      Prior to Admission medications   Medication Sig Start Date End Date Taking? Authorizing Provider  ALPRAZolam Duanne Moron) 0.5 MG tablet Take 0.5 mg by mouth 3 (three) times daily as needed for anxiety. For anxiety.   Yes Historical Provider, MD  aMILoride (MIDAMOR) 5 MG tablet Take 10 mg by mouth daily.   Yes Historical Provider, MD  amLODipine (NORVASC) 10 MG tablet Take 10 mg by  mouth daily.   Yes Historical Provider, MD  Calcium-Vitamin D (CALTRATE 600 PLUS-VIT D PO) Take 1 tablet by mouth daily.    Yes Historical Provider, MD  cholecalciferol (VITAMIN D) 1000 UNITS tablet Take 1,000 Units by mouth daily.   Yes Historical Provider, MD  citalopram (CELEXA) 20 MG tablet Take 20 mg by mouth daily. 11/11/12  Yes Historical Provider, MD  Cyanocobalamin (VITAMIN B 12 PO) Take 1,000 mg by mouth daily.   Yes Historical Provider, MD  ferrous sulfate 324 (65 FE) MG TBEC Take 1 tablet by mouth daily.    Yes Historical Provider, MD  Glucosamine-Chondroit-Vit C-Mn (GLUCOSAMINE CHONDR 1500 COMPLX) CAPS Take 1 capsule by mouth daily.   Yes Historical Provider, MD  pantoprazole (PROTONIX) 40 MG tablet Take 1 tablet (40 mg total) by  mouth daily. 02/16/13  Yes Kandice Hams, MD  potassium chloride SA (K-DUR,KLOR-CON) 20 MEQ tablet Take 20 mEq by mouth 2 (two) times daily.   Yes Historical Provider, MD  Tamsulosin HCl (FLOMAX) 0.4 MG CAPS Take 0.8 mg by mouth at bedtime.   Yes Historical Provider, MD  warfarin (COUMADIN) 2 MG tablet Take 2 mg by mouth at bedtime.    Yes Historical Provider, MD   Physical Exam: Filed Vitals:   06/19/13 1555  BP:   Pulse:   Temp:   Resp: 23    BP 158/95  Pulse 121  Temp(Src) 98.2 F (36.8 C) (Oral)  Resp 23  Ht 5\' 11"  (1.803 m)  Wt 85.73 kg (189 lb)  BMI 26.37 kg/m2  SpO2 98%  General:  Appears calm and comfortable Eyes: PERRL, normal lids, irises & conjunctiva ENT: grossly normal hearing, lips & tongue Neck: no LAD, masses or thyromegaly Cardiovascular: RRR, no m/r/g. No LE edema. Telemetry: SR, tachy with PVC Respiratory: CTA bilaterally, no w/r/r. Normal respiratory effort. Abdomen: soft, ntnd, foley placed in ER Skin: no rash or induration seen on limited exam Musculoskeletal: grossly normal tone BUE/BLE Psychiatric: grossly normal mood and affect, speech fluent and appropriate Neurologic: grossly non-focal.          Labs on Admission:  Basic Metabolic Panel:  Recent Labs Lab 06/19/13 1120  NA 145  K 3.8  CL 105  CO2 28  GLUCOSE 97  BUN 18  CREATININE 1.26  CALCIUM 9.5   Liver Function Tests: No results found for this basename: AST, ALT, ALKPHOS, BILITOT, PROT, ALBUMIN,  in the last 168 hours No results found for this basename: LIPASE, AMYLASE,  in the last 168 hours No results found for this basename: AMMONIA,  in the last 168 hours CBC:  Recent Labs Lab 06/19/13 1120  WBC 6.0  NEUTROABS 4.8  HGB 12.0*  HCT 37.7*  MCV 91.7  PLT 217   Cardiac Enzymes:  Recent Labs Lab 06/19/13 1422  TROPONINI <0.30    BNP (last 3 results) No results found for this basename: PROBNP,  in the last 8760 hours CBG: No results found for this basename:  GLUCAP,  in the last 168 hours  Radiological Exams on Admission: Dg Chest 2 View  06/19/2013   CLINICAL DATA:  Chest pain. Shortness breath. Hypertension. Altered mental status.  EXAM: CHEST  2 VIEW  COMPARISON:  02/13/2013, 11/24/2012 and 06/01/2011.  FINDINGS: Right apical pleural thickening without associated bony destruction similar to prior exams.  Elevated left hemidiaphragm unchanged.  Calcified tortuous aorta. This appears relatively similar to prior exam. If thoracic aortic abnormality is of concern as cause of the patient's chest  pain then CT may be considered.  Cardiomegaly.  Central pulmonary vascular prominence without pulmonary edema.  Crowding of lung markings lung bases felt to be vascular in origin and similar to the prior exam adhesions without evidence of segmental consolidation.  IMPRESSION: Cardiomegaly.  Tortuous calcified aorta as noted above.  No segmental infiltrate, pulmonary edema or pneumothorax.   Electronically Signed   By: Chauncey Cruel M.D.   On: 06/19/2013 15:38    EKG: Independently reviewed. Sinus tachy on monitor  Assessment/Plan Active Problems:   Dementia   UTI (lower urinary tract infection)   Mild aortic stenosis   PAF (paroxysmal atrial fibrillation)   UTI- has been given 5 doses of an abx but wife not sure which one- given to him by Dr. Janice Norrie per office, he was seen on may 28th ;levaquin 250 mg on June 2nd was given, but culture showed pseudomonas on culture- but resistant to levaquin- asked for fax of culture- sensitive to zosyn, imipenem, ceftazadime, defepime, gent, tobro - will start zosyn  Sinus tachy with PVC- patient does have history of atrial fibrillation- may need restart of BB- on coumadin- pharmacy for dose  H/o DVT- on coumadin, pharmacy consult  Dementia- patient is not ambulatory at baseline   LM for Dr. Delfina Redwood   Code Status: *full Family Communication: patient/wife Disposition Plan: admit  Time spent: 75 min  [facility] Pager [phone]  **Disclaimer: This note may have been dictated with voice recognition software. Similar sounding words can inadvertently be transcribed and this note may contain transcription errors which may not have been corrected upon publication of note.**

## 2013-06-19 NOTE — ED Provider Notes (Signed)
CSN: [id number]     Arrival date & time 06/19/13  1035 History   First MD Initiated Contact with Patient 06/19/13 1037     Chief Complaint  Patient presents with  . Urinary Retention     (Consider location/radiation/quality/duration/timing/severity/associated sxs/prior Treatment) The history is provided by the patient.   Patient here with urinary retention x2 weeks. He does have a condom catheter in place this time. States he has had trouble starting or strain. Patient denies fever or chills. Symptoms persisted nothing makes them better or worse. Denies any back pain or flank pain. Right suprapubic pain noted without diarrhea. Past Medical History  Diagnosis Date  . Generalized weakness   . Hypokalemia   . Renal disorder   . Hyperlipidemia   . Vitamin D deficiency   . Bell's palsy   . Lumbar stenosis   . Hypertension   . Depression   . Dementia   . Prostate cancer ~ 2004    "had radiation tx" (11/24/2012)  . DVT (deep venous thrombosis) 11/24/2012    Acute DVT in the rightcommon femoral vein and acute DVT involving the leftcommon femorall,profinda popliteal and posterior tibial vein 2013. Pt seen by hematology in 2014. W/S revealed continued clot in left leg  . Asthma     "as a child, real bad" (11/24/2012)  . History of bleeding ulcers ~ 1956    "in hospital for ~ 1 month" (11/24/2012)  . Recurrent UTI (urinary tract infection) 2013-now    "lots" (11/24/2012)  . DJD (degenerative joint disease)   . Arthritis     "joints" (11/24/2012)  . Anxiety   . Hyperaldosteronism   . CHF (congestive heart failure)     Diastolic dysfunction  . Vitamin B12 deficiency   . Spinal stenosis     Dr Christella Noa  . Dilated aortic root   . Mild aortic stenosis     echo 2012   Past Surgical History  Procedure Laterality Date  . Total knee arthroplasty Left 02/2010  . Back surgery  2008    "for stenosis" (11/24/2012)  . Esophagogastroduodenoscopy Left 02/16/2013    Procedure:  ESOPHAGOGASTRODUODENOSCOPY (EGD);  Surgeon: Arta Silence, MD;  Location: [facility] ENDOSCOPY;  Service: Endoscopy;  Laterality: Left;   Family History  Problem Relation Age of Onset  . Heart disease Mother   . Diabetes Father    History  Substance Use Topics  . Smoking status: Former Smoker -- 10 years    Types: Cigarettes  . Smokeless tobacco: Never Used     Comment: 11/24/2012 "quit smoking cigarettes in ~ 1956"  . Alcohol Use: Yes     Comment: occasoinal wine    Review of Systems  All other systems reviewed and are negative.     Allergies  Contrast media and Iohexol  Home Medications   Prior to Admission medications   Medication Sig Start Date End Date Taking? Authorizing Provider  ALPRAZolam Duanne Moron) 0.5 MG tablet Take 0.5 mg by mouth 3 (three) times daily as needed for anxiety. For anxiety.    Historical Provider, MD  aMILoride (MIDAMOR) 5 MG tablet Take 10 mg by mouth daily.    Historical Provider, MD  amLODipine (NORVASC) 10 MG tablet Take 10 mg by mouth daily.    Historical Provider, MD  Calcium-Vitamin D (CALTRATE 600 PLUS-VIT D PO) Take 1 tablet by mouth 2 (two) times daily.    Historical Provider, MD  cholecalciferol (VITAMIN D) 1000 UNITS tablet Take 1,000 Units by mouth daily.  Historical Provider, MD  citalopram (CELEXA) 20 MG tablet Take 20 mg by mouth daily. 11/11/12   Historical Provider, MD  Cyanocobalamin (VITAMIN B 12 PO) Take 1,000 mg by mouth daily.    Historical Provider, MD  ferrous sulfate 324 (65 FE) MG TBEC Take 1 tablet by mouth 2 (two) times daily.    Historical Provider, MD  Glucosamine-Chondroit-Vit C-Mn (GLUCOSAMINE CHONDR 1500 COMPLX) CAPS Take 1 capsule by mouth daily.    Historical Provider, MD  pantoprazole (PROTONIX) 40 MG tablet Take 1 tablet (40 mg total) by mouth daily. 02/16/13   Kandice Hams, MD  potassium chloride SA (K-DUR,KLOR-CON) 20 MEQ tablet Take 20 mEq by mouth 2 (two) times daily.    Historical Provider, MD  Tamsulosin HCl  (FLOMAX) 0.4 MG CAPS Take 0.8 mg by mouth at bedtime.    Historical Provider, MD  warfarin (COUMADIN) 2 MG tablet Take 1-2 mg by mouth daily. Take 1/2 tablet (1mg ) on saturdays and sundays all other days take 1tablet (2mg )    Historical Provider, MD   BP 134/84  Temp(Src) 98.2 F (36.8 C) (Oral)  Ht 5\' 11"  (1.803 m)  Wt 189 lb (85.73 kg)  BMI 26.37 kg/m2  SpO2 100% Physical Exam  Nursing note and vitals reviewed. Constitutional: He appears well-developed and well-nourished.  Non-toxic appearance. No distress.  HENT:  Head: Normocephalic and atraumatic.  Eyes: Conjunctivae, EOM and lids are normal. Pupils are equal, round, and reactive to light.  Neck: Normal range of motion. Neck supple. No tracheal deviation present. No mass present.  Cardiovascular: Normal rate, regular rhythm and normal heart sounds.  Exam reveals no gallop.   No murmur heard. Pulmonary/Chest: Effort normal and breath sounds normal. No stridor. No respiratory distress. He has no decreased breath sounds. He has no wheezes. He has no rhonchi. He has no rales.  Abdominal: Soft. Normal appearance and bowel sounds are normal. He exhibits no distension. There is no tenderness. There is no rigidity, no rebound, no guarding and no CVA tenderness.  Musculoskeletal: Normal range of motion. He exhibits no edema and no tenderness.  Neurological: He is alert. GCS eye subscore is 4. GCS verbal subscore is 5. GCS motor subscore is 6.  Skin: Skin is warm and dry. No abrasion and no rash noted.  Psychiatric: His affect is blunt. His speech is delayed. He is slowed.    ED Course  Procedures (including critical care time) Labs Review Labs Reviewed  URINE CULTURE  BASIC METABOLIC PANEL  URINALYSIS, ROUTINE W REFLEX MICROSCOPIC  CBC WITH DIFFERENTIAL    Imaging Review No results found.   EKG Interpretation None      MDM   Final diagnoses:  None     Date: 06/19/2013  Rate: 119   Rhythm: sinus tachycardia  QRS  Axis: normal  Intervals: normal  ST/T Wave abnormalities: nonspecific ST changes  Conduction Disutrbances:irregular rate  Narrative Interpretation:   Old EKG Reviewed: none available  Patient placed on Rocephin for her urinary tract infection. Will be admitted for IV antibiotics as well as IV fluids as well as treatment of his tachycardia arrhythmia.     Leota Jacobsen, MD 06/19/13 [phone]

## 2013-06-19 NOTE — ED Notes (Signed)
catherter placed by Satira Mccallum and Lenna Sciara

## 2013-06-20 ENCOUNTER — Other Ambulatory Visit (HOSPITAL_COMMUNITY): Payer: Medicare PPO

## 2013-06-20 LAB — BASIC METABOLIC PANEL
BUN: 17 mg/dL (ref 6–23)
CALCIUM: 9.1 mg/dL (ref 8.4–10.5)
CO2: 27 mEq/L (ref 19–32)
CREATININE: 1.16 mg/dL (ref 0.50–1.35)
Chloride: 103 mEq/L (ref 96–112)
GFR calc non Af Amer: 55 mL/min — ABNORMAL LOW (ref >90)
GFR, EST AFRICAN AMERICAN: 63 mL/min — AB (ref >90)
Glucose, Bld: 100 mg/dL — ABNORMAL HIGH (ref 70–99)
Potassium: 3.5 mEq/L — ABNORMAL LOW (ref 3.7–5.3)
SODIUM: 144 meq/L (ref 137–147)

## 2013-06-20 LAB — CBC
HCT: 35.3 % — ABNORMAL LOW (ref 39.0–52.0)
Hemoglobin: 11 g/dL — ABNORMAL LOW (ref 13.0–17.0)
MCH: 27.9 pg (ref 26.0–34.0)
MCHC: 31.2 g/dL (ref 30.0–36.0)
MCV: 89.6 fL (ref 78.0–100.0)
PLATELETS: 210 10*3/uL (ref 150–400)
RBC: 3.94 MIL/uL — ABNORMAL LOW (ref 4.22–5.81)
RDW: 14.8 % (ref 11.5–15.5)
WBC: 5.6 10*3/uL (ref 4.0–10.5)

## 2013-06-20 LAB — PROTIME-INR
INR: 2.55 — AB (ref 0.00–1.49)
Prothrombin Time: 26.6 seconds — ABNORMAL HIGH (ref 11.6–15.2)

## 2013-06-20 MED ORDER — WARFARIN SODIUM 2 MG PO TABS
2.0000 mg | ORAL_TABLET | Freq: Once | ORAL | Status: AC
  Administered 2013-06-20: 2 mg via ORAL
  Filled 2013-06-20: qty 1

## 2013-06-20 NOTE — Progress Notes (Signed)
Subjective: Admission history and physical reviewed, patient admitted with urinary retention, abnormal UA. Apparently recently started on antibiotics by urology, culture shows Pseudomonas which was resistant to Levaquin according to notes. Because of urinary retention patient presented to the ED. Foley catheter was placed without difficulty. Currently patient voiding without difficulty. Urine culture pending. Patient has a known history of paroxysmal atrial fibrillation he is on anti-coagulation. His blood pressure generally runs low and recently had syncope due to low blood pressure and A. Fib. He has been seen by cardiology, he's had a 2-D echo. Objective: Vital signs in last 24 hours: Temp:  [97.3 F (36.3 C)-98.5 F (36.9 C)] 97.5 F (36.4 C) (06/09 0418) Pulse Rate:  [96-123] 101 (06/09 0418) Resp:  [18-27] 18 (06/09 0418) BP: (128-176)/(68-104) 147/97 mmHg (06/09 0418) SpO2:  [97 %-100 %] 100 % (06/09 0418) Weight:  [85.73 kg (189 lb)-86.2 kg (190 lb 0.6 oz)] 86.2 kg (190 lb 0.6 oz) (06/08 1806) Weight change:  Last BM Date: 06/19/13  Intake/Output from previous day: 06/08 0701 - 06/09 0700 In: 240 [P.O.:240] Out: 1100 [Urine:1100] Intake/Output this shift:    General appearance: pleasant, no apparent distress Resp: clear to auscultation bilaterally Cardio: irregularly irregular rhythm Extremities: no edema, right heel has some breakdown  Lab Results:  Results for orders placed during the hospital encounter of 06/19/13 (from the past 24 hour(s))  BASIC METABOLIC PANEL     Status: Abnormal   Collection Time    06/19/13 11:20 AM      Result Value Ref Range   Sodium 145  137 - 147 mEq/L   Potassium 3.8  3.7 - 5.3 mEq/L   Chloride 105  96 - 112 mEq/L   CO2 28  19 - 32 mEq/L   Glucose, Bld 97  70 - 99 mg/dL   BUN 18  6 - 23 mg/dL   Creatinine, Ser 1.26  0.50 - 1.35 mg/dL   Calcium 9.5  8.4 - 10.5 mg/dL   GFR calc non Af Amer 49 (*) >90 mL/min   GFR calc Af Amer 57 (*) >90  mL/min  CBC WITH DIFFERENTIAL     Status: Abnormal   Collection Time    06/19/13 11:20 AM      Result Value Ref Range   WBC 6.0  4.0 - 10.5 K/uL   RBC 4.11 (*) 4.22 - 5.81 MIL/uL   Hemoglobin 12.0 (*) 13.0 - 17.0 g/dL   HCT 37.7 (*) 39.0 - 52.0 %   MCV 91.7  78.0 - 100.0 fL   MCH 29.2  26.0 - 34.0 pg   MCHC 31.8  30.0 - 36.0 g/dL   RDW 15.3  11.5 - 15.5 %   Platelets 217  150 - 400 K/uL   Neutrophils Relative % 79 (*) 43 - 77 %   Neutro Abs 4.8  1.7 - 7.7 K/uL   Lymphocytes Relative 11 (*) 12 - 46 %   Lymphs Abs 0.6 (*) 0.7 - 4.0 K/uL   Monocytes Relative 10  3 - 12 %   Monocytes Absolute 0.6  0.1 - 1.0 K/uL   Eosinophils Relative 0  0 - 5 %   Eosinophils Absolute 0.0  0.0 - 0.7 K/uL   Basophils Relative 0  0 - 1 %   Basophils Absolute 0.0  0.0 - 0.1 K/uL  URINALYSIS, ROUTINE W REFLEX MICROSCOPIC     Status: Abnormal   Collection Time    06/19/13 12:30 PM      Result  Value Ref Range   Color, Urine YELLOW  YELLOW   APPearance CLOUDY (*) CLEAR   Specific Gravity, Urine 1.021  1.005 - 1.030   pH 5.0  5.0 - 8.0   Glucose, UA NEGATIVE  NEGATIVE mg/dL   Hgb urine dipstick MODERATE (*) NEGATIVE   Bilirubin Urine NEGATIVE  NEGATIVE   Ketones, ur NEGATIVE  NEGATIVE mg/dL   Protein, ur NEGATIVE  NEGATIVE mg/dL   Urobilinogen, UA 0.2  0.0 - 1.0 mg/dL   Nitrite POSITIVE (*) NEGATIVE   Leukocytes, UA LARGE (*) NEGATIVE  URINE MICROSCOPIC-ADD ON     Status: Abnormal   Collection Time    06/19/13 12:30 PM      Result Value Ref Range   Squamous Epithelial / LPF MANY (*) RARE   WBC, UA TOO NUMEROUS TO COUNT  <3 WBC/hpf   RBC / HPF 3-6  <3 RBC/hpf   Bacteria, UA MANY (*) RARE  TROPONIN I     Status: None   Collection Time    06/19/13  2:22 PM      Result Value Ref Range   Troponin I <0.30  <0.30 ng/mL  PROTIME-INR     Status: Abnormal   Collection Time    06/19/13  2:22 PM      Result Value Ref Range   Prothrombin Time 24.3 (*) 11.6 - 15.2 seconds   INR 2.27 (*) 0.00 - 1.49   PROTIME-INR     Status: Abnormal   Collection Time    06/20/13  3:57 AM      Result Value Ref Range   Prothrombin Time 26.6 (*) 11.6 - 15.2 seconds   INR 2.55 (*) 0.00 - 5.95  BASIC METABOLIC PANEL     Status: Abnormal   Collection Time    06/20/13  3:57 AM      Result Value Ref Range   Sodium 144  137 - 147 mEq/L   Potassium 3.5 (*) 3.7 - 5.3 mEq/L   Chloride 103  96 - 112 mEq/L   CO2 27  19 - 32 mEq/L   Glucose, Bld 100 (*) 70 - 99 mg/dL   BUN 17  6 - 23 mg/dL   Creatinine, Ser 1.16  0.50 - 1.35 mg/dL   Calcium 9.1  8.4 - 10.5 mg/dL   GFR calc non Af Amer 55 (*) >90 mL/min   GFR calc Af Amer 63 (*) >90 mL/min  CBC     Status: Abnormal   Collection Time    06/20/13  3:57 AM      Result Value Ref Range   WBC 5.6  4.0 - 10.5 K/uL   RBC 3.94 (*) 4.22 - 5.81 MIL/uL   Hemoglobin 11.0 (*) 13.0 - 17.0 g/dL   HCT 35.3 (*) 39.0 - 52.0 %   MCV 89.6  78.0 - 100.0 fL   MCH 27.9  26.0 - 34.0 pg   MCHC 31.2  30.0 - 36.0 g/dL   RDW 14.8  11.5 - 15.5 %   Platelets 210  150 - 400 K/uL      Studies/Results: Dg Chest 2 View  06/19/2013   CLINICAL DATA:  Chest pain. Shortness breath. Hypertension. Altered mental status.  EXAM: CHEST  2 VIEW  COMPARISON:  02/13/2013, 11/24/2012 and 06/01/2011.  FINDINGS: Right apical pleural thickening without associated bony destruction similar to prior exams.  Elevated left hemidiaphragm unchanged.  Calcified tortuous aorta. This appears relatively similar to prior exam. If thoracic aortic abnormality is of concern  as cause of the patient's chest pain then CT may be considered.  Cardiomegaly.  Central pulmonary vascular prominence without pulmonary edema.  Crowding of lung markings lung bases felt to be vascular in origin and similar to the prior exam adhesions without evidence of segmental consolidation.  IMPRESSION: Cardiomegaly.  Tortuous calcified aorta as noted above.  No segmental infiltrate, pulmonary edema or pneumothorax.   Electronically Signed   By:  Chauncey Cruel M.D.   On: 06/19/2013 15:38    Medications:  Prior to Admission:  Prescriptions prior to admission  Medication Sig Dispense Refill  . ALPRAZolam (XANAX) 0.5 MG tablet Take 0.5 mg by mouth 3 (three) times daily as needed for anxiety. For anxiety.      Marland Kitchen aMILoride (MIDAMOR) 5 MG tablet Take 10 mg by mouth daily.      Marland Kitchen amLODipine (NORVASC) 10 MG tablet Take 10 mg by mouth daily.      . Calcium-Vitamin D (CALTRATE 600 PLUS-VIT D PO) Take 1 tablet by mouth daily.       . cholecalciferol (VITAMIN D) 1000 UNITS tablet Take 1,000 Units by mouth daily.      . citalopram (CELEXA) 20 MG tablet Take 20 mg by mouth daily.      . Cyanocobalamin (VITAMIN B 12 PO) Take 1,000 mg by mouth daily.      . ferrous sulfate 324 (65 FE) MG TBEC Take 1 tablet by mouth daily.       . Glucosamine-Chondroit-Vit C-Mn (GLUCOSAMINE CHONDR 1500 COMPLX) CAPS Take 1 capsule by mouth daily.      . pantoprazole (PROTONIX) 40 MG tablet Take 1 tablet (40 mg total) by mouth daily.  30 tablet  1  . potassium chloride SA (K-DUR,KLOR-CON) 20 MEQ tablet Take 20 mEq by mouth 2 (two) times daily.      . Tamsulosin HCl (FLOMAX) 0.4 MG CAPS Take 0.8 mg by mouth at bedtime.      Marland Kitchen warfarin (COUMADIN) 2 MG tablet Take 2 mg by mouth at bedtime.        Scheduled: . aMILoride  10 mg Oral Daily  . amLODipine  10 mg Oral Daily  . cholecalciferol  1,000 Units Oral Daily  . citalopram  20 mg Oral Daily  . ferrous sulfate  325 mg Oral Q breakfast  . pantoprazole  40 mg Oral Daily  . piperacillin-tazobactam (ZOSYN)  IV  3.375 g Intravenous Q8H  . potassium chloride SA  20 mEq Oral BID  . sodium chloride  3 mL Intravenous Q12H  . tamsulosin  0.8 mg Oral QHS  . Warfarin - Pharmacist Dosing Inpatient   Does not apply q1800   Continuous: . sodium chloride Stopped (06/20/13 0815)   TKZ:[id number], acetaminophen, ALPRAZolam  Assessment/Plan: Elderly male presenting to the hospital with urinary retention, UA abnormal,  outpatient cultures according to records show Pseudomonas resistant to Levaquin. Continue Zosyn, I would try to contact urology for culture results and adjust antibiotics appropriately. We will continue Foley catheter in patient more than likely we will have further outpatient followup with urology unless prolonged hospitalization.  Paroxysmal atrial fibrillation, high in the therapeutic, historically patient has a low blood pressure at home therefore beta blocker/calcium channel blocker not started and rate generally has been well-controlled at home , Continue gentle IV fluids  History of DVT, continue current anticoagulation  Dementia Chronic kidney disease stage III CHF, diastolic dysfunction Primary hyperaldosteronism History of prostate cancer Right heel decubiti, continue to elevate foot    LOS:  1 day   Kandice Hams 06/20/2013, 10:00 AM

## 2013-06-20 NOTE — Significant Event (Addendum)
Responded to a call that patient had removed his PIV on his right hand. Noted PIV completely out when assessed. Patient was confused at the time; was very adamant not needing another one when a new PIV insertion was attempted. Spoke with Dr. Delfina Redwood that patient did not want another PIV. Patient has antibiotics due this evening.

## 2013-06-20 NOTE — Progress Notes (Signed)
INITIAL NUTRITION ASSESSMENT  DOCUMENTATION CODES Per approved criteria  -Not Applicable   INTERVENTION: No nutrition intervention at this time --- patient declined RD to follow for nutrition care plan  NUTRITION DIAGNOSIS: Increased nutrient needs related to skin integrity as evidenced by estimated nutrition needs   Goal: Pt to meet >/= 90% of their estimated nutrition needs   Monitor:  PO intake, weight, labs, I/O's  Reason for Assessment: Low Braden  80 y.o. male  Admitting Dx: urinary retention  ASSESSMENT: 80 y.o. male with urinary retention for 2 week. Saw Dr. Kellie Simmering- given levaquin 250 mg x 5 days. Culture came back pseudomonas and resistant to levaquin. Patient presented to ER now with worsening retention and abdominal pain.   RD spoke with patient's wife at bedside; reports patient eats very well at home, however, has had some progressive weight loss in the past year ( ~ 10 lbs); wife attributes this to periods of time where pt is in pain and he will not eat well; per MD note pt with R heel decubiti.  Low braden score places patient at risk for further skin breakdown.  Nutrition focused physical exam completed.  No muscle or subcutaneous fat depletion noticed.  Height: Ht Readings from Last 1 Encounters:  06/19/13 5\' 11"  (1.803 m)    Weight: Wt Readings from Last 1 Encounters:  06/19/13 190 lb 0.6 oz (86.2 kg)    Ideal Body Weight: 172 lb  % Ideal Body Weight: 110%  Wt Readings from Last 10 Encounters:  06/19/13 190 lb 0.6 oz (86.2 kg)  06/01/13 189 lb (85.73 kg)  02/16/13 188 lb 4.8 oz (85.412 kg)  02/16/13 188 lb 4.8 oz (85.412 kg)  11/25/12 202 lb 2.6 oz (91.7 kg)  06/18/11 190 lb (86.183 kg)    Usual Body Weight: 188 lb  % Usual Body Weight: 101%  BMI:  Body mass index is 26.52 kg/(m^2).  Estimated Nutritional Needs: Kcal: 1800-2000 Protein: 90-100 gm Fluid: 1.8-2.0 L  Skin: R heel breakdown  Diet Order: Cardiac  EDUCATION  NEEDS: -No education needs identified at this time   Intake/Output Summary (Last 24 hours) at 06/20/13 1116 Last data filed at 06/20/13 1032  Gross per 24 hour  Intake   1000 ml  Output   1100 ml  Net   -100 ml    Labs:   Recent Labs Lab 06/19/13 1120 06/20/13 0357  NA 145 144  K 3.8 3.5*  CL 105 103  CO2 28 27  BUN 18 17  CREATININE 1.26 1.16  CALCIUM 9.5 9.1  GLUCOSE 97 100*    Scheduled Meds: . aMILoride  10 mg Oral Daily  . amLODipine  10 mg Oral Daily  . cholecalciferol  1,000 Units Oral Daily  . citalopram  20 mg Oral Daily  . ferrous sulfate  325 mg Oral Q breakfast  . pantoprazole  40 mg Oral Daily  . piperacillin-tazobactam (ZOSYN)  IV  3.375 g Intravenous Q8H  . potassium chloride SA  20 mEq Oral BID  . sodium chloride  3 mL Intravenous Q12H  . tamsulosin  0.8 mg Oral QHS  . warfarin  2 mg Oral ONCE-1800  . Warfarin - Pharmacist Dosing Inpatient   Does not apply q1800    Continuous Infusions: . sodium chloride Stopped (06/20/13 0815)    Past Medical History  Diagnosis Date  . Generalized weakness   . Hypokalemia   . Renal disorder   . Hyperlipidemia   . Vitamin D deficiency   .  Bell's palsy   . Lumbar stenosis   . Hypertension   . Depression   . Dementia   . Prostate cancer ~ 2004    "had radiation tx" (11/24/2012)  . DVT (deep venous thrombosis) 11/24/2012    Acute DVT in the rightcommon femoral vein and acute DVT involving the leftcommon femorall,profinda popliteal and posterior tibial vein 2013. Pt seen by hematology in 2014. W/S revealed continued clot in left leg  . Asthma     "as a child, real bad" (11/24/2012)  . History of bleeding ulcers ~ 1956    "in hospital for ~ 1 month" (11/24/2012)  . Recurrent UTI (urinary tract infection) 2013-now    "lots" (11/24/2012)  . DJD (degenerative joint disease)   . Arthritis     "joints" (11/24/2012)  . Anxiety   . Hyperaldosteronism   . CHF (congestive heart failure)     Diastolic  dysfunction  . Vitamin B12 deficiency   . Spinal stenosis     Dr Christella Noa  . Dilated aortic root   . Mild aortic stenosis     echo 2012    Past Surgical History  Procedure Laterality Date  . Total knee arthroplasty Left 02/2010  . Back surgery  2008    "for stenosis" (11/24/2012)  . Esophagogastroduodenoscopy Left 02/16/2013    Procedure: ESOPHAGOGASTRODUODENOSCOPY (EGD);  Surgeon: Arta Silence, MD;  Location: [facility] ENDOSCOPY;  Service: Endoscopy;  Laterality: Left;    Arthur Holms, RD, LDN Pager #: [phone] After-Hours Pager #: [phone]

## 2013-06-20 NOTE — Progress Notes (Signed)
ANTICOAGULATION CONSULT NOTE - Follow Up Consult  Pharmacy Consult for coumadin Indication: hx DVT, Afib  Allergies  Allergen Reactions  . Contrast Media [Iodinated Diagnostic Agents]     Pt blacked out  . Iohexol      Desc: sob, throat swelling (1988 gdc) pt requires full premeds and does well, JB 8/14/7     Patient Measurements: Height: 5\' 11"  (180.3 cm) Weight: 190 lb 0.6 oz (86.2 kg) IBW/kg (Calculated) : 75.3   Vital Signs: Temp: 97.5 F (36.4 C) (06/09 0418) Temp src: Oral (06/09 0418) BP: 147/97 mmHg (06/09 0418) Pulse Rate: 101 (06/09 0418)  Labs:  Recent Labs  06/19/13 1120 06/19/13 1422 06/20/13 0357  HGB 12.0*  --  11.0*  HCT 37.7*  --  35.3*  PLT 217  --  210  LABPROT  --  24.3* 26.6*  INR  --  2.27* 2.55*  CREATININE 1.26  --  1.16  TROPONINI  --  <0.30  --     Estimated Creatinine Clearance: 47.8 ml/min (by C-G formula based on Cr of 1.16).  Assessment: Patient is an 80 y.o M on coumadin for hx DVT, afib.  INR remains therapeutic at 2.55.  CBC stable, no bleeding document.  Goal of Therapy:  INR 2-3    Plan:  1) coumadin 2mg  PO x1 today  Maryjean Corpening P Juan Kissoon 06/20/2013,11:01 AM

## 2013-06-21 DIAGNOSIS — I517 Cardiomegaly: Secondary | ICD-10-CM

## 2013-06-21 LAB — PROTIME-INR
INR: 2.11 — AB (ref 0.00–1.49)
PROTHROMBIN TIME: 23 s — AB (ref 11.6–15.2)

## 2013-06-21 LAB — URINE CULTURE: Colony Count: >=100000

## 2013-06-21 MED ORDER — WARFARIN SODIUM 2 MG PO TABS
2.0000 mg | ORAL_TABLET | Freq: Once | ORAL | Status: AC
  Administered 2013-06-21: 2 mg via ORAL
  Filled 2013-06-21 (×2): qty 1

## 2013-06-21 MED ORDER — AMLODIPINE BESYLATE 10 MG PO TABS
10.0000 mg | ORAL_TABLET | Freq: Every day | ORAL | Status: DC
  Administered 2013-06-22: 10 mg via ORAL
  Filled 2013-06-21 (×2): qty 1

## 2013-06-21 NOTE — Progress Notes (Signed)
Subjective: Other than pulling his IV out last night, there were no problems. Patient remains afebrile, hemodynamically stable. I did discuss with urology, cultures results, they did have plans to start the patient on gentamicin 80 mg IM daily x5. I did discuss this with the wife, ideally she would like to have her husband home as soon as possible.  Objective: Vital signs in last 24 hours: Temp:  [97.3 F (36.3 C)-99.3 F (37.4 C)] 97.3 F (36.3 C) (06/10 0406) Pulse Rate:  [90-123] 90 (06/10 0406) Resp:  [18] 18 (06/10 0406) BP: (125-159)/(82-105) 154/99 mmHg (06/10 0406) SpO2:  [98 %-100 %] 100 % (06/10 0406) Weight change:  Last BM Date: 06/20/13  Intake/Output from previous day: 06/09 0701 - 06/10 0700 In: 2362.9 [P.O.:1140; I.V.:1022.9; IV Piggyback:200] Out: 2750 [Urine:2750] Intake/Output this shift:    General appearance: pleasantly confused, in no apparent distress Resp: clear to auscultation bilaterally Cardio: irregularly irregular rhythm GI: soft, non-tender; bowel sounds normal; no masses,  no organomegaly Extremities: no edema, right ankle in a support brace  Lab Results:  Results for orders placed during the hospital encounter of 06/19/13 (from the past 24 hour(s))  PROTIME-INR     Status: Abnormal   Collection Time    06/21/13  3:44 AM      Result Value Ref Range   Prothrombin Time 23.0 (*) 11.6 - 15.2 seconds   INR 2.11 (*) 0.00 - 1.49      Studies/Results: Dg Chest 2 View  06/19/2013   CLINICAL DATA:  Chest pain. Shortness breath. Hypertension. Altered mental status.  EXAM: CHEST  2 VIEW  COMPARISON:  02/13/2013, 11/24/2012 and 06/01/2011.  FINDINGS: Right apical pleural thickening without associated bony destruction similar to prior exams.  Elevated left hemidiaphragm unchanged.  Calcified tortuous aorta. This appears relatively similar to prior exam. If thoracic aortic abnormality is of concern as cause of the patient's chest pain then CT may be  considered.  Cardiomegaly.  Central pulmonary vascular prominence without pulmonary edema.  Crowding of lung markings lung bases felt to be vascular in origin and similar to the prior exam adhesions without evidence of segmental consolidation.  IMPRESSION: Cardiomegaly.  Tortuous calcified aorta as noted above.  No segmental infiltrate, pulmonary edema or pneumothorax.   Electronically Signed   By: Chauncey Cruel M.D.   On: 06/19/2013 15:38    Medications:  Prior to Admission:  Prescriptions prior to admission  Medication Sig Dispense Refill  . ALPRAZolam (XANAX) 0.5 MG tablet Take 0.5 mg by mouth 3 (three) times daily as needed for anxiety. For anxiety.      Marland Kitchen aMILoride (MIDAMOR) 5 MG tablet Take 10 mg by mouth daily.      Marland Kitchen amLODipine (NORVASC) 10 MG tablet Take 10 mg by mouth daily.      . Calcium-Vitamin D (CALTRATE 600 PLUS-VIT D PO) Take 1 tablet by mouth daily.       . cholecalciferol (VITAMIN D) 1000 UNITS tablet Take 1,000 Units by mouth daily.      . citalopram (CELEXA) 20 MG tablet Take 20 mg by mouth daily.      . Cyanocobalamin (VITAMIN B 12 PO) Take 1,000 mg by mouth daily.      . ferrous sulfate 324 (65 FE) MG TBEC Take 1 tablet by mouth daily.       . Glucosamine-Chondroit-Vit C-Mn (GLUCOSAMINE CHONDR 1500 COMPLX) CAPS Take 1 capsule by mouth daily.      . pantoprazole (PROTONIX) 40 MG tablet Take 1  tablet (40 mg total) by mouth daily.  30 tablet  1  . potassium chloride SA (K-DUR,KLOR-CON) 20 MEQ tablet Take 20 mEq by mouth 2 (two) times daily.      . Tamsulosin HCl (FLOMAX) 0.4 MG CAPS Take 0.8 mg by mouth at bedtime.      Marland Kitchen warfarin (COUMADIN) 2 MG tablet Take 2 mg by mouth at bedtime.        Scheduled: . aMILoride  10 mg Oral Daily  . amLODipine  10 mg Oral Daily  . cholecalciferol  1,000 Units Oral Daily  . citalopram  20 mg Oral Daily  . ferrous sulfate  325 mg Oral Q breakfast  . pantoprazole  40 mg Oral Daily  . piperacillin-tazobactam (ZOSYN)  IV  3.375 g  Intravenous Q8H  . potassium chloride SA  20 mEq Oral BID  . sodium chloride  3 mL Intravenous Q12H  . tamsulosin  0.8 mg Oral QHS  . warfarin  2 mg Oral ONCE-1800  . Warfarin - Pharmacist Dosing Inpatient   Does not apply q1800   Continuous: . sodium chloride 10 mL/hr at 06/21/13 0834   MCN:[id number], acetaminophen, ALPRAZolam  Assessment/Plan: Elderly male presenting to the hospital with urinary retention, UA abnormal, outpatient cultures according to records show Pseudomonas resistant to Levaquin. Inpatient cultures also show Pseudomonas. Per urology sensitivity was to gentamicin. For now continue Zosyn, await followup culture . We will continue Foley catheter until patient has followup outpatient with his urologist.  Paroxysmal atrial fibrillation, INR remains therapeutic, historically patient has a low blood pressure at home therefore beta blocker/calcium channel blocker not started and rate generally has been well-controlled at home . Patient was supposed to have a 2-D echo on a outpatient basis however the wife did not get to the appointment. We will try to obtain today while he is hospitalized.  History of DVT, continue current anticoagulation  Dementia  Chronic kidney disease stage III  CHF, diastolic dysfunction  Primary hyperaldosteronism  History of prostate cancer  Right heel decubiti, continue to elevate foot   LOS: 2 days   Joshua Rodgers D 06/21/2013, 10:02 AM

## 2013-06-21 NOTE — Progress Notes (Signed)
ANTICOAGULATION CONSULT NOTE - Follow Up Consult  Pharmacy Consult for coumadin Indication: hx DVT, Afib  Allergies  Allergen Reactions  . Contrast Media [Iodinated Diagnostic Agents]     Pt blacked out  . Iohexol      Desc: sob, throat swelling (1988 gdc) pt requires full premeds and does well, JB 8/14/7     Patient Measurements: Height: 5\' 11"  (180.3 cm) Weight: 190 lb 0.6 oz (86.2 kg) IBW/kg (Calculated) : 75.3   Vital Signs: Temp: 97.3 F (36.3 C) (06/10 0406) Temp src: Oral (06/10 0406) BP: 154/99 mmHg (06/10 0406) Pulse Rate: 90 (06/10 0406)  Labs:  Recent Labs  06/19/13 1120 06/19/13 1422 06/20/13 0357 06/21/13 0344  HGB 12.0*  --  11.0*  --   HCT 37.7*  --  35.3*  --   PLT 217  --  210  --   LABPROT  --  24.3* 26.6* 23.0*  INR  --  2.27* 2.55* 2.11*  CREATININE 1.26  --  1.16  --   TROPONINI  --  <0.30  --   --     Estimated Creatinine Clearance: 47.8 ml/min (by C-G formula based on Cr of 1.16).   Assessment: Patient is an 80 y.o M on coumadin for hx DVT and Afib.  INR is therapeutic at 2.11 today.  No bleeding documented.  Goal of Therapy:  INR 2-3    Plan:  1) coumadin 2mg  PO x1 today   Calder Oblinger P 06/21/2013,9:07 AM

## 2013-06-21 NOTE — Care Management Note (Signed)
    Page 1 of 2   06/22/2013     1:38:50 PM CARE MANAGEMENT NOTE 06/22/2013  Patient:  Joshua Rodgers, Joshua Rodgers   Account Number:  [id number]  Date Initiated:  06/21/2013  Documentation initiated by:  Ryleah Miramontes  Subjective/Objective Assessment:   Pt adm on 06/19/13 with UTI, urinary retention.   PTA, pt resides at home with and is cared for by spouse. Has HHA 2hrs/day, Mon-Fri.     Action/Plan:   Met with pt and wife to discuss dc plans. Plan to dc on IM Gentamycin x 5 days, if [facility] can do injections at home.   Anticipated DC Date:  06/22/2013   Anticipated DC Plan:  [place]  CM consult      [facility] Choice  HOME HEALTH   Choice offered to / List presented to:  C-1 Patient        [place] arranged  HH-1 RN      [place].   Status of service:  Completed, signed off Medicare Important Message given?  YES (If response is "NO", the following Medicare IM given date fields will be blank) Date Medicare IM given:  06/19/2013 Date Additional Medicare IM given:  06/22/2013  Discharge Disposition:  [place]  Per UR Regulation:  Reviewed for med. necessity/level of care/duration of stay  If discussed at [place] of Stay Meetings, dates discussed:    Comments:  06/22/13 Ellan Lambert, RN, BSN [phone] Pt for dc home today with wife and [facility] services as arranged. Faxed Gentamycin Rx to [facility] (fax [phone]).  Med will be available after 1pm on Friday for pickup, per [facility] staff.  Wife made aware, and info placed on dc instructions sheet.  Pt given 1st dose of IM Gent prior to dc; HHRN aware to schedule afternoon visit on Friday, as med will not be available until then.  06/21/13 Ellan Lambert, RN, BSN [phone] Spoke with Dr Delfina Redwood regarding plan for dc on IM Gentamycin...confirmed with pharmacist that this is appropriate therapy for pt's infection.  MD states pt can be dc on 6/11 if HHRN able to give  injections daily at home.  Pt/wife prefer [facility] for home care needs; confirmed with agency that they are able to provide daily injections of Gentamycin at dc.  Gentamycin not readily available at retail pharmacies:  called multiple pharmacies attempting to arrange for OP [facility].  [facility] [address] in [place], [place]; if Rx faxed in am on 6/11, drug available around 1pm on Friday, 6/12.  Pt will need to receive first dose of IM Gent prior to dc on 6/11.  [facility]'s [facility] states [facility] [place] [facility] their [facility], and she will have no problem picking up med.

## 2013-06-21 NOTE — Discharge Instructions (Addendum)
Information on my medicine - Coumadin   (Warfarin)  This medication education was reviewed with me or my healthcare representative as part of my discharge preparation.  The pharmacist that spoke with me during my hospital stay was:  Lynelle Doctor, [facility]  Why was Coumadin prescribed for you? Coumadin was prescribed for you because you have a blood clot or a medical condition that can cause an increased risk of forming blood clots. Blood clots can cause serious health problems by blocking the flow of blood to the heart, lung, or brain. Coumadin can prevent harmful blood clots from forming. As a reminder your indication for Coumadin is:   Stroke Prevention Because Of Atrial Fibrillationhistory of pulmonary embolism  What test will check on my response to Coumadin? While on Coumadin (warfarin) you will need to have an INR test regularly to ensure that your dose is keeping you in the desired range. The INR (international normalized ratio) number is calculated from the result of the laboratory test called prothrombin time (PT).  If an INR APPOINTMENT HAS NOT ALREADY BEEN MADE FOR YOU please schedule an appointment to have this lab work done by your health care provider within 7 days. Your INR goal is usually a number between:  2 to 3 or your provider may give you a more narrow range like 2-2.5.  Ask your health care provider during an office visit what your goal INR is.  What  do you need to  know  About  COUMADIN? Take Coumadin (warfarin) exactly as prescribed by your healthcare provider about the same time each day.  DO NOT stop taking without talking to the doctor who prescribed the medication.  Stopping without other blood clot prevention medication to take the place of Coumadin may increase your risk of developing a new clot or stroke.  Get refills before you run out.  What do you do if you miss a dose? If you miss a dose, take it as soon as you remember on the same day then continue your regularly  scheduled regimen the next day.  Do not take two doses of Coumadin at the same time.  Important Safety Information A possible side effect of Coumadin (Warfarin) is an increased risk of bleeding. You should call your healthcare provider right away if you experience any of the following:   Bleeding from an injury or your nose that does not stop.   Unusual colored urine (red or dark brown) or unusual colored stools (red or black).   Unusual bruising for unknown reasons.   A serious fall or if you hit your head (even if there is no bleeding).  Some foods or medicines interact with Coumadin (warfarin) and might alter your response to warfarin. To help avoid this:   Eat a balanced diet, maintaining a consistent amount of Vitamin K.   Notify your provider about major diet changes you plan to make.   Avoid alcohol or limit your intake to 1 drink for women and 2 drinks for men per day. (1 drink is 5 oz. wine, 12 oz. beer, or 1.5 oz. liquor.)  Make sure that ANY health care provider who prescribes medication for you knows that you are taking Coumadin (warfarin).  Also make sure the healthcare provider who is monitoring your Coumadin knows when you have started a new medication including herbals and non-prescription products.  Coumadin (Warfarin)  Major Drug Interactions  Increased Warfarin Effect Decreased Warfarin Effect  Alcohol (large quantities) Antibiotics (esp. Septra/Bactrim, Flagyl, Cipro)  Amiodarone (Cordarone) Aspirin (ASA) Cimetidine (Tagamet) Megestrol (Megace) NSAIDs (ibuprofen, naproxen, etc.) Piroxicam (Feldene) Propafenone (Rythmol SR) Propranolol (Inderal) Isoniazid (INH) Posaconazole (Noxafil) Barbiturates (Phenobarbital) Carbamazepine (Tegretol) Chlordiazepoxide (Librium) Cholestyramine (Questran) Griseofulvin Oral Contraceptives Rifampin Sucralfate (Carafate) Vitamin K   Coumadin (Warfarin) Major Herbal Interactions  Increased Warfarin Effect Decreased Warfarin  Effect  Garlic Ginseng Ginkgo biloba Coenzyme Q10 Green tea St. Johns wort    Coumadin (Warfarin) FOOD Interactions  Eat a consistent number of servings per week of foods HIGH in Vitamin K (1 serving =  cup)  Collards (cooked, or boiled & drained) Kale (cooked, or boiled & drained) Mustard greens (cooked, or boiled & drained) Parsley *serving size only =  cup Spinach (cooked, or boiled & drained) Swiss chard (cooked, or boiled & drained) Turnip greens (cooked, or boiled & drained)  Eat a consistent number of servings per week of foods MEDIUM-HIGH in Vitamin K (1 serving = 1 cup)  Asparagus (cooked, or boiled & drained) Broccoli (cooked, boiled & drained, or raw & chopped) Brussel sprouts (cooked, or boiled & drained) *serving size only =  cup Lettuce, raw (green leaf, endive, romaine) Spinach, raw Turnip greens, raw & chopped   These websites have more information on Coumadin (warfarin):  FailFactory.se; VeganReport.com.au;    Please pick up Gentamycin Rx at [facility] in [place] on Friday, June 12 after 1pm.   Pharmacy phone [phone]  Urinary Tract Infection Urinary tract infections (UTIs) can develop anywhere along your urinary tract. Your urinary tract is your body's drainage system for removing wastes and extra water. Your urinary tract includes two kidneys, two ureters, a bladder, and a urethra. Your kidneys are a pair of bean-shaped organs. Each kidney is about the size of your fist. They are located below your ribs, one on each side of your spine. CAUSES Infections are caused by microbes, which are microscopic organisms, including fungi, viruses, and bacteria. These organisms are so small that they can only be seen through a microscope. Bacteria are the microbes that most commonly cause UTIs. SYMPTOMS  Symptoms of UTIs may vary by age and gender of the patient and by the location of the infection. Symptoms in young women typically include a frequent  and intense urge to urinate and a painful, burning feeling in the bladder or urethra during urination. Older women and men are more likely to be tired, shaky, and weak and have muscle aches and abdominal pain. A fever may mean the infection is in your kidneys. Other symptoms of a kidney infection include pain in your back or sides below the ribs, nausea, and vomiting. DIAGNOSIS To diagnose a UTI, your caregiver will ask you about your symptoms. Your caregiver also will ask to provide a urine sample. The urine sample will be tested for bacteria and white blood cells. White blood cells are made by your body to help fight infection. TREATMENT  Typically, UTIs can be treated with medication. Because most UTIs are caused by a bacterial infection, they usually can be treated with the use of antibiotics. The choice of antibiotic and length of treatment depend on your symptoms and the type of bacteria causing your infection. HOME CARE INSTRUCTIONS  If you were prescribed antibiotics, take them exactly as your caregiver instructs you. Finish the medication even if you feel better after you have only taken some of the medication.  Drink enough water and fluids to keep your urine clear or pale yellow.  Avoid caffeine, tea, and carbonated beverages. They tend to irritate your bladder.  Empty your bladder often. Avoid holding urine for long periods of time.  Empty your bladder before and after sexual intercourse.  After a bowel movement, women should cleanse from front to back. Use each tissue only once. SEEK MEDICAL CARE IF:   You have back pain.  You develop a fever.  Your symptoms do not begin to resolve within 3 days. SEEK IMMEDIATE MEDICAL CARE IF:   You have severe back pain or lower abdominal pain.  You develop chills.  You have nausea or vomiting.  You have continued burning or discomfort with urination. MAKE SURE YOU:   Understand these instructions.  Will watch your  condition.  Will get help right away if you are not doing well or get worse. Document Released: 10/08/2004 Document Revised: 06/30/2011 Document Reviewed: 02/06/2011 [facility] Patient Information 2014 [place]. Acute Urinary Retention, Male Acute urinary retention is the temporary inability to urinate. This is a common problem in older men. As men age their prostates become larger and block the flow of urine from the bladder. This is usually a problem that has come on gradually.  HOME CARE INSTRUCTIONS If you are sent home with a Foley catheter and a drainage system, you will need to discuss the best course of action with your health care provider. While the catheter is in, maintain a good intake of fluids. Keep the drainage bag emptied and lower than your catheter. This is so that contaminated urine will not flow back into your bladder, which could lead to a urinary tract infection. There are two main types of drainage bags. One is a large bag that usually is used at night. It has a good capacity that will allow you to sleep through the night without having to empty it. The second type is called a leg bag. It has a smaller capacity, so it needs to be emptied more frequently. However, the main advantage is that it can be attached by a leg strap and can go underneath your clothing, allowing you the freedom to move about or leave your home. Only take over-the-counter or prescription medicines for pain, discomfort, or fever as directed by your health care provider.  SEEK MEDICAL CARE IF:  You develop a low-grade fever.  You experience spasms or leakage of urine with the spasms. SEEK IMMEDIATE MEDICAL CARE IF:   You develop chills or fever.  Your catheter stops draining urine.  Your catheter falls out.  You start to develop increased bleeding that does not respond to rest and increased fluid intake. MAKE SURE YOU:  Understand these instructions.  Will watch your condition.  Will get  help right away if you are not doing well or get worse. Document Released: 04/06/2000 Document Revised: 08/31/2012 Document Reviewed: 06/09/2012 [facility] Patient Information 2014 [place], [place]. Acute Urinary Retention, Male Acute urinary retention is the temporary inability to urinate. This is a common problem in older men. As men age their prostates become larger and block the flow of urine from the bladder. This is usually a problem that has come on gradually.  HOME CARE INSTRUCTIONS If you are sent home with a Foley catheter and a drainage system, you will need to discuss the best course of action with your health care provider. While the catheter is in, maintain a good intake of fluids. Keep the drainage bag emptied and lower than your catheter. This is so that contaminated urine will not flow back into your bladder, which could lead to a urinary tract infection. There are  two main types of drainage bags. One is a large bag that usually is used at night. It has a good capacity that will allow you to sleep through the night without having to empty it. The second type is called a leg bag. It has a smaller capacity, so it needs to be emptied more frequently. However, the main advantage is that it can be attached by a leg strap and can go underneath your clothing, allowing you the freedom to move about or leave your home. Only take over-the-counter or prescription medicines for pain, discomfort, or fever as directed by your health care provider.  SEEK MEDICAL CARE IF:  You develop a low-grade fever.  You experience spasms or leakage of urine with the spasms. SEEK IMMEDIATE MEDICAL CARE IF:   You develop chills or fever.  Your catheter stops draining urine.  Your catheter falls out.  You start to develop increased bleeding that does not respond to rest and increased fluid intake. MAKE SURE YOU:  Understand these instructions.  Will watch your condition.  Will get help right away if you  are not doing well or get worse. Document Released: 04/06/2000 Document Revised: 08/31/2012 Document Reviewed: 06/09/2012 [facility] Patient Information 2014 [place], [place].

## 2013-06-21 NOTE — Progress Notes (Signed)
  Echocardiogram 2D Echocardiogram has been performed.  Basilia Jumbo 06/21/2013, 12:43 PM

## 2013-06-22 LAB — PROTIME-INR
INR: 1.87 — AB (ref 0.00–1.49)
Prothrombin Time: 21 seconds — ABNORMAL HIGH (ref 11.6–15.2)

## 2013-06-22 MED ORDER — GENTAMICIN SULFATE 40 MG/ML IJ SOLN
80.0000 mg | INTRAMUSCULAR | Status: DC
  Administered 2013-06-22: 80 mg via INTRAMUSCULAR
  Filled 2013-06-22: qty 2

## 2013-06-22 MED ORDER — GENTAMICIN SULFATE 40 MG/ML IJ SOLN: 80.0000 mg | INTRAMUSCULAR | Status: AC

## 2013-06-22 MED ORDER — WARFARIN SODIUM 3 MG PO TABS
3.0000 mg | ORAL_TABLET | Freq: Once | ORAL | Status: DC
  Filled 2013-06-22: qty 1

## 2013-06-22 NOTE — Discharge Summary (Signed)
Physician Discharge Summary  Patient ID: Joshua Rodgers MRN: [id number] DOB/AGE: February 04, 1923 80 y.o.  Admit date: 06/19/2013 Discharge date: 06/22/2013  Admission Diagnoses:  Discharge Diagnoses:  Active Problems:   Dementia   Bladder, atonic   UTI (lower urinary tract infection)   Mild aortic stenosis   PAF (paroxysmal atrial fibrillation)   Discharged Condition: stable  Hospital Course:  Patient presented to the hospital with urinary retention, of note previously he was seen by urology, for treatment of the UTI, urine culture shows Pseudomonas which was resistant to Levaquin. Because of urinary retention, resistant organism any elderly male the patient was deemed necessary for further evaluation and treatment. Of note patient has multiple comorbidities including recently diagnosed paroxysmal atrial fibrillation hypertension diastolic dysfunction DVT etc. Patient was admitted to the medical floor today, he was given IV fluids and IV antibiotic, Zosyn. I discussed case with patient's urologist, culture was sensitive to gentamicin. The plan was to provide patient IM gentamicin  at home, and maintain Foley catheter and Flomax until patient could be seen on an outpatient basis. There were no urinary complications this hospitalization. In regards to his atrial fibrillation, a 2-D echo was obtained which continued to show as her LV function, diastolic dysfunction. Patient was intolerant to beta blocker in the past, had bradycardia and some syncope. He has been seen by cardiology on an outpatient basis. Currently he tolerates the atrial fibrillation well and we will continue further outpatient management of this.  Consults:    Significant Diagnostic Studies:Dg Chest 2 View  06/19/2013   CLINICAL DATA:  Chest pain. Shortness breath. Hypertension. Altered mental status.  EXAM: CHEST  2 VIEW  COMPARISON:  02/13/2013, 11/24/2012 and 06/01/2011.  FINDINGS: Right apical pleural thickening without associated  bony destruction similar to prior exams.  Elevated left hemidiaphragm unchanged.  Calcified tortuous aorta. This appears relatively similar to prior exam. If thoracic aortic abnormality is of concern as cause of the patient's chest pain then CT may be considered.  Cardiomegaly.  Central pulmonary vascular prominence without pulmonary edema.  Crowding of lung markings lung bases felt to be vascular in origin and similar to the prior exam adhesions without evidence of segmental consolidation.  IMPRESSION: Cardiomegaly.  Tortuous calcified aorta as noted above.  No segmental infiltrate, pulmonary edema or pneumothorax.   Electronically Signed   By: Chauncey Cruel M.D.   On: 06/19/2013 15:38      Discharge Exam: Blood pressure 147/81, pulse 102, temperature 98.4 F (36.9 C), temperature source Oral, resp. rate 19, height 5\' 11"  (1.803 m), weight 86.2 kg (190 lb 0.6 oz), SpO2 97.00%. General appearance: pleasantly confused Resp: clear to auscultation bilaterally Cardio: irregularly irregular rhythm Extremities: no clubbing no edema, patient has a heel  decubitus on the right  Disposition: 01-Home or Self Care     Medication List         ALPRAZolam 0.5 MG tablet  Commonly known as:  XANAX  Take 0.5 mg by mouth 3 (three) times daily as needed for anxiety. For anxiety.     aMILoride 5 MG tablet  Commonly known as:  MIDAMOR  Take 10 mg by mouth daily.     amLODipine 10 MG tablet  Commonly known as:  NORVASC  Take 10 mg by mouth daily.     CALTRATE 600 PLUS-VIT D PO  Take 1 tablet by mouth daily.     cholecalciferol 1000 UNITS tablet  Commonly known as:  VITAMIN D  Take 1,000 Units by mouth  daily.     citalopram 20 MG tablet  Commonly known as:  CELEXA  Take 20 mg by mouth daily.     ferrous sulfate 324 (65 FE) MG Tbec  Take 1 tablet by mouth daily.     gentamicin 40 MG/ML injection  Commonly known as:  GARAMYCIN  Inject 2 mLs (80 mg total) into the muscle daily.     GLUCOSAMINE  CHONDR 1500 COMPLX Caps  Take 1 capsule by mouth daily.     pantoprazole 40 MG tablet  Commonly known as:  PROTONIX  Take 1 tablet (40 mg total) by mouth daily.     potassium chloride SA 20 MEQ tablet  Commonly known as:  K-DUR,KLOR-CON  Take 20 mEq by mouth 2 (two) times daily.     tamsulosin 0.4 MG Caps capsule  Commonly known as:  FLOMAX  Take 0.8 mg by mouth at bedtime.     VITAMIN B 12 PO  Take 1,000 mg by mouth daily.     warfarin 2 MG tablet  Commonly known as:  COUMADIN  Take 2 mg by mouth at bedtime.           Follow-up Information   Follow up with [facility]. (Home health nurse to follow up with you at home )    Contact information:   [address] [place] [address] [phone]       Follow up with Kandice Hams, MD In 1 week.   Specialty:  Internal Medicine   Contact information:   [address] [place] [address] [phone]       Follow up with Arvil Persons, MD In 1 week.   Specialty:  Urology   Contact information:   [address] [place] [address] [phone]      35 minutes were spent in the discharge process of this patient. Medication reconciliation, discussing with family, care management and arranging  followup Signed: Nyan Dufresne D 06/22/2013, 12:24 PM

## 2013-06-22 NOTE — Progress Notes (Signed)
ANTICOAGULATION and ANTIBIOTIC CONSULT NOTE - Follow Up Consult  Pharmacy Consult for coumadin; zosyn Indication: hx DVT, Afib; UTI   Allergies  Allergen Reactions  . Contrast Media [Iodinated Diagnostic Agents]     Pt blacked out  . Iohexol      Desc: sob, throat swelling (1988 gdc) pt requires full premeds and does well, JB 8/14/7     Patient Measurements: Height: 5\' 11"  (180.3 cm) Weight: 190 lb 0.6 oz (86.2 kg) IBW/kg (Calculated) : 75.3   Vital Signs:    Labs:  Recent Labs  06/19/13 1120  06/19/13 1422 06/20/13 0357 06/21/13 0344 06/22/13 0407  HGB 12.0*  --   --  11.0*  --   --   HCT 37.7*  --   --  35.3*  --   --   PLT 217  --   --  210  --   --   LABPROT  --   < > 24.3* 26.6* 23.0* 21.0*  INR  --   < > 2.27* 2.55* 2.11* 1.87*  CREATININE 1.26  --   --  1.16  --   --   TROPONINI  --   --  <0.30  --   --   --   < > = values in this interval not displayed.  Estimated Creatinine Clearance: 47.8 ml/min (by C-G formula based on Cr of 1.16).  Assessment: Patient is an 80 y.o Mon coumadin for hx DVT, afib.  INR trending down to 1.87 today-- all coumadin doses charted in [facility].  No bleeding documented.  He's also on zosyn for UTI.  Urine culture now back positive for pseudomonas with plan to transition patient over to gentamicin IM for 5 days at discharge. Plan for possible discharge home today.  6/8 zosyn Rx>>  6/8 ucx >> pseudomonas (>100K) FINAL  Goal of Therapy:  INR 2-3    Plan:  1) increase coumadin to 3mg  PO x1 today 2) continue zosyn 3.375gm IV q8h (infuse over 4 hours) for now  Sarika Baldini P 06/22/2013,9:14 AM

## 2013-08-27 ENCOUNTER — Encounter (HOSPITAL_COMMUNITY): Payer: Self-pay | Admitting: Internal Medicine

## 2013-08-27 ENCOUNTER — Inpatient Hospital Stay (HOSPITAL_COMMUNITY)
Admission: EM | Admit: 2013-08-27 | Discharge: 2013-08-28 | DRG: 698 | Disposition: A | Discharge status: Home Health | Payer: Medicare PPO | Attending: Internal Medicine | Admitting: Internal Medicine

## 2013-08-27 DIAGNOSIS — G929 Unspecified toxic encephalopathy: Secondary | ICD-10-CM | POA: Diagnosis present

## 2013-08-27 DIAGNOSIS — M48061 Spinal stenosis, lumbar region without neurogenic claudication: Secondary | ICD-10-CM | POA: Diagnosis present

## 2013-08-27 DIAGNOSIS — F0391 Unspecified dementia with behavioral disturbance: Secondary | ICD-10-CM | POA: Diagnosis present

## 2013-08-27 DIAGNOSIS — Z87891 Personal history of nicotine dependence: Secondary | ICD-10-CM | POA: Diagnosis not present

## 2013-08-27 DIAGNOSIS — I10 Essential (primary) hypertension: Secondary | ICD-10-CM | POA: Diagnosis present

## 2013-08-27 DIAGNOSIS — Z91041 Radiographic dye allergy status: Secondary | ICD-10-CM

## 2013-08-27 DIAGNOSIS — I4891 Unspecified atrial fibrillation: Secondary | ICD-10-CM | POA: Diagnosis present

## 2013-08-27 DIAGNOSIS — G510 Bell's palsy: Secondary | ICD-10-CM | POA: Diagnosis present

## 2013-08-27 DIAGNOSIS — F329 Major depressive disorder, single episode, unspecified: Secondary | ICD-10-CM | POA: Diagnosis present

## 2013-08-27 DIAGNOSIS — Z86718 Personal history of other venous thrombosis and embolism: Secondary | ICD-10-CM

## 2013-08-27 DIAGNOSIS — Z7901 Long term (current) use of anticoagulants: Secondary | ICD-10-CM

## 2013-08-27 DIAGNOSIS — T83091A Other mechanical complication of indwelling urethral catheter, initial encounter: Principal | ICD-10-CM | POA: Diagnosis present

## 2013-08-27 DIAGNOSIS — R600 Localized edema: Secondary | ICD-10-CM

## 2013-08-27 DIAGNOSIS — R1030 Lower abdominal pain, unspecified: Secondary | ICD-10-CM

## 2013-08-27 DIAGNOSIS — E872 Acidosis, unspecified: Secondary | ICD-10-CM | POA: Diagnosis present

## 2013-08-27 DIAGNOSIS — Z9079 Acquired absence of other genital organ(s): Secondary | ICD-10-CM

## 2013-08-27 DIAGNOSIS — C61 Malignant neoplasm of prostate: Secondary | ICD-10-CM

## 2013-08-27 DIAGNOSIS — E876 Hypokalemia: Secondary | ICD-10-CM | POA: Diagnosis present

## 2013-08-27 DIAGNOSIS — M1990 Unspecified osteoarthritis, unspecified site: Secondary | ICD-10-CM | POA: Diagnosis present

## 2013-08-27 DIAGNOSIS — Z833 Family history of diabetes mellitus: Secondary | ICD-10-CM

## 2013-08-27 DIAGNOSIS — K921 Melena: Secondary | ICD-10-CM

## 2013-08-27 DIAGNOSIS — I480 Paroxysmal atrial fibrillation: Secondary | ICD-10-CM

## 2013-08-27 DIAGNOSIS — E559 Vitamin D deficiency, unspecified: Secondary | ICD-10-CM | POA: Diagnosis present

## 2013-08-27 DIAGNOSIS — Z66 Do not resuscitate: Secondary | ICD-10-CM | POA: Diagnosis present

## 2013-08-27 DIAGNOSIS — E269 Hyperaldosteronism, unspecified: Secondary | ICD-10-CM | POA: Diagnosis present

## 2013-08-27 DIAGNOSIS — N12 Tubulo-interstitial nephritis, not specified as acute or chronic: Secondary | ICD-10-CM | POA: Diagnosis present

## 2013-08-27 DIAGNOSIS — I350 Nonrheumatic aortic (valve) stenosis: Secondary | ICD-10-CM

## 2013-08-27 DIAGNOSIS — F3289 Other specified depressive episodes: Secondary | ICD-10-CM | POA: Diagnosis present

## 2013-08-27 DIAGNOSIS — R339 Retention of urine, unspecified: Secondary | ICD-10-CM | POA: Diagnosis not present

## 2013-08-27 DIAGNOSIS — E538 Deficiency of other specified B group vitamins: Secondary | ICD-10-CM | POA: Diagnosis present

## 2013-08-27 DIAGNOSIS — I714 Abdominal aortic aneurysm, without rupture, unspecified: Secondary | ICD-10-CM | POA: Diagnosis present

## 2013-08-27 DIAGNOSIS — F411 Generalized anxiety disorder: Secondary | ICD-10-CM | POA: Diagnosis present

## 2013-08-27 DIAGNOSIS — Z8249 Family history of ischemic heart disease and other diseases of the circulatory system: Secondary | ICD-10-CM | POA: Diagnosis not present

## 2013-08-27 DIAGNOSIS — J45909 Unspecified asthma, uncomplicated: Secondary | ICD-10-CM | POA: Diagnosis present

## 2013-08-27 DIAGNOSIS — F03918 Unspecified dementia, unspecified severity, with other behavioral disturbance: Secondary | ICD-10-CM

## 2013-08-27 DIAGNOSIS — G92 Toxic encephalopathy: Secondary | ICD-10-CM | POA: Diagnosis present

## 2013-08-27 DIAGNOSIS — I5032 Chronic diastolic (congestive) heart failure: Secondary | ICD-10-CM | POA: Diagnosis present

## 2013-08-27 DIAGNOSIS — E785 Hyperlipidemia, unspecified: Secondary | ICD-10-CM | POA: Diagnosis present

## 2013-08-27 DIAGNOSIS — I509 Heart failure, unspecified: Secondary | ICD-10-CM | POA: Diagnosis present

## 2013-08-27 DIAGNOSIS — Z87440 Personal history of urinary (tract) infections: Secondary | ICD-10-CM

## 2013-08-27 DIAGNOSIS — R109 Unspecified abdominal pain: Secondary | ICD-10-CM

## 2013-08-27 DIAGNOSIS — Z8546 Personal history of malignant neoplasm of prostate: Secondary | ICD-10-CM | POA: Diagnosis not present

## 2013-08-27 DIAGNOSIS — Z79899 Other long term (current) drug therapy: Secondary | ICD-10-CM | POA: Diagnosis not present

## 2013-08-27 DIAGNOSIS — Y846 Urinary catheterization as the cause of abnormal reaction of the patient, or of later complication, without mention of misadventure at the time of the procedure: Secondary | ICD-10-CM | POA: Diagnosis present

## 2013-08-27 DIAGNOSIS — N312 Flaccid neuropathic bladder, not elsewhere classified: Secondary | ICD-10-CM

## 2013-08-27 DIAGNOSIS — Z888 Allergy status to other drugs, medicaments and biological substances status: Secondary | ICD-10-CM | POA: Diagnosis not present

## 2013-08-27 DIAGNOSIS — N289 Disorder of kidney and ureter, unspecified: Secondary | ICD-10-CM | POA: Diagnosis present

## 2013-08-27 DIAGNOSIS — R269 Unspecified abnormalities of gait and mobility: Secondary | ICD-10-CM

## 2013-08-27 DIAGNOSIS — N390 Urinary tract infection, site not specified: Secondary | ICD-10-CM | POA: Diagnosis present

## 2013-08-27 DIAGNOSIS — I77810 Thoracic aortic ectasia: Secondary | ICD-10-CM

## 2013-08-27 DIAGNOSIS — I499 Cardiac arrhythmia, unspecified: Secondary | ICD-10-CM

## 2013-08-27 DIAGNOSIS — I359 Nonrheumatic aortic valve disorder, unspecified: Secondary | ICD-10-CM | POA: Diagnosis present

## 2013-08-27 DIAGNOSIS — F0390 Unspecified dementia without behavioral disturbance: Secondary | ICD-10-CM | POA: Diagnosis present

## 2013-08-27 DIAGNOSIS — Z96659 Presence of unspecified artificial knee joint: Secondary | ICD-10-CM

## 2013-08-27 LAB — CBC WITH DIFFERENTIAL/PLATELET
BASOS ABS: 0 10*3/uL (ref 0.0–0.1)
BASOS PCT: 0 % (ref 0–1)
EOS ABS: 0 10*3/uL (ref 0.0–0.7)
Eosinophils Relative: 0 % (ref 0–5)
HCT: 37.9 % — ABNORMAL LOW (ref 39.0–52.0)
Hemoglobin: 12.2 g/dL — ABNORMAL LOW (ref 13.0–17.0)
Lymphocytes Relative: 4 % — ABNORMAL LOW (ref 12–46)
Lymphs Abs: 0.4 10*3/uL — ABNORMAL LOW (ref 0.7–4.0)
MCH: 28.9 pg (ref 26.0–34.0)
MCHC: 32.2 g/dL (ref 30.0–36.0)
MCV: 89.8 fL (ref 78.0–100.0)
MONOS PCT: 7 % (ref 3–12)
Monocytes Absolute: 0.7 10*3/uL (ref 0.1–1.0)
NEUTROS ABS: 8.9 10*3/uL — AB (ref 1.7–7.7)
NEUTROS PCT: 89 % — AB (ref 43–77)
PLATELETS: 202 10*3/uL (ref 150–400)
RBC: 4.22 MIL/uL (ref 4.22–5.81)
RDW: 14.9 % (ref 11.5–15.5)
WBC: 10 10*3/uL (ref 4.0–10.5)

## 2013-08-27 LAB — URINE MICROSCOPIC-ADD ON

## 2013-08-27 LAB — URINALYSIS, ROUTINE W REFLEX MICROSCOPIC
BILIRUBIN URINE: NEGATIVE
Glucose, UA: NEGATIVE mg/dL
Ketones, ur: NEGATIVE mg/dL
NITRITE: NEGATIVE
PH: 7.5 (ref 5.0–8.0)
Protein, ur: 100 mg/dL — AB
SPECIFIC GRAVITY, URINE: 1.018 (ref 1.005–1.030)
Urobilinogen, UA: 0.2 mg/dL (ref 0.0–1.0)

## 2013-08-27 LAB — I-STAT VENOUS BLOOD GAS, ED
Acid-Base Excess: 1 mmol/L (ref 0.0–2.0)
BICARBONATE: 26 meq/L — AB (ref 20.0–24.0)
O2 SAT: 46 %
PO2 VEN: 25 mmHg — AB (ref 30.0–45.0)
TCO2: 27 mmol/L (ref 0–100)
pCO2, Ven: 40.2 mmHg — ABNORMAL LOW (ref 45.0–50.0)
pH, Ven: 7.418 — ABNORMAL HIGH (ref 7.250–7.300)

## 2013-08-27 LAB — COMPREHENSIVE METABOLIC PANEL
ALBUMIN: 3.8 g/dL (ref 3.5–5.2)
ALK PHOS: 57 U/L (ref 39–117)
ALT: 8 U/L (ref 0–53)
AST: 18 U/L (ref 0–37)
Anion gap: 20 — ABNORMAL HIGH (ref 5–15)
BUN: 17 mg/dL (ref 6–23)
CHLORIDE: 104 meq/L (ref 96–112)
CO2: 23 mEq/L (ref 19–32)
Calcium: 10 mg/dL (ref 8.4–10.5)
Creatinine, Ser: 1.11 mg/dL (ref 0.50–1.35)
GFR calc Af Amer: 66 mL/min — ABNORMAL LOW (ref >90)
GFR calc non Af Amer: 57 mL/min — ABNORMAL LOW (ref >90)
Glucose, Bld: 123 mg/dL — ABNORMAL HIGH (ref 70–99)
POTASSIUM: 4.1 meq/L (ref 3.7–5.3)
Sodium: 147 mEq/L (ref 137–147)
TOTAL PROTEIN: 6.9 g/dL (ref 6.0–8.3)
Total Bilirubin: 0.4 mg/dL (ref 0.3–1.2)

## 2013-08-27 LAB — LIPASE, BLOOD: LIPASE: 26 U/L (ref 11–59)

## 2013-08-27 LAB — I-STAT CG4 LACTIC ACID, ED
LACTIC ACID, VENOUS: 6.98 mmol/L — AB (ref 0.5–2.2)
Lactic Acid, Venous: 2.4 mmol/L — ABNORMAL HIGH (ref 0.5–2.2)

## 2013-08-27 LAB — PROTIME-INR
INR: 1.62 — ABNORMAL HIGH (ref 0.00–1.49)
Prothrombin Time: 19.2 seconds — ABNORMAL HIGH (ref 11.6–15.2)

## 2013-08-27 LAB — APTT: APTT: 30 s (ref 24–37)

## 2013-08-27 MED ORDER — CITALOPRAM HYDROBROMIDE 20 MG PO TABS
20.0000 mg | ORAL_TABLET | Freq: Every day | ORAL | Status: DC
  Administered 2013-08-27 – 2013-08-28 (×2): 20 mg via ORAL
  Filled 2013-08-27 (×2): qty 1

## 2013-08-27 MED ORDER — POTASSIUM CHLORIDE CRYS ER 20 MEQ PO TBCR
20.0000 meq | EXTENDED_RELEASE_TABLET | Freq: Two times a day (BID) | ORAL | Status: DC
  Administered 2013-08-28: 20 meq via ORAL
  Filled 2013-08-27 (×2): qty 1

## 2013-08-27 MED ORDER — HALOPERIDOL LACTATE 5 MG/ML IJ SOLN
2.0000 mg | Freq: Four times a day (QID) | INTRAMUSCULAR | Status: DC | PRN
  Administered 2013-08-27: 2 mg via INTRAVENOUS
  Filled 2013-08-27: qty 1

## 2013-08-27 MED ORDER — ALPRAZOLAM 0.5 MG PO TABS
0.5000 mg | ORAL_TABLET | Freq: Three times a day (TID) | ORAL | Status: DC | PRN
  Administered 2013-08-27 (×2): 0.5 mg via ORAL
  Filled 2013-08-27 (×2): qty 1

## 2013-08-27 MED ORDER — DIPHENHYDRAMINE HCL 50 MG PO CAPS
50.0000 mg | ORAL_CAPSULE | Freq: Once | ORAL | Status: DC
Start: 2013-08-28 — End: 2013-08-28
  Filled 2013-08-27: qty 1

## 2013-08-27 MED ORDER — DEXTROSE 5 % IV SOLN
1.0000 g | Freq: Two times a day (BID) | INTRAVENOUS | Status: DC
  Administered 2013-08-27 – 2013-08-28 (×3): 1 g via INTRAVENOUS
  Filled 2013-08-27 (×4): qty 1

## 2013-08-27 MED ORDER — SODIUM CHLORIDE 0.9 % IV SOLN
INTRAVENOUS | Status: AC
  Administered 2013-08-27: 09:00:00 via INTRAVENOUS

## 2013-08-27 MED ORDER — AMLODIPINE BESYLATE 10 MG PO TABS
10.0000 mg | ORAL_TABLET | Freq: Every day | ORAL | Status: DC
  Administered 2013-08-27 – 2013-08-28 (×2): 10 mg via ORAL
  Filled 2013-08-27 (×2): qty 1

## 2013-08-27 MED ORDER — ALBUTEROL SULFATE (2.5 MG/3ML) 0.083% IN NEBU: 2.5000 mg | INHALATION_SOLUTION | RESPIRATORY_TRACT | Status: DC | PRN

## 2013-08-27 MED ORDER — PREDNISONE 50 MG PO TABS: 50.0000 mg | ORAL_TABLET | Freq: Once | ORAL | Status: DC

## 2013-08-27 MED ORDER — DILTIAZEM HCL 25 MG/5ML IV SOLN
5.0000 mg | Freq: Once | INTRAVENOUS | Status: DC
  Filled 2013-08-27: qty 5

## 2013-08-27 MED ORDER — HYDROMORPHONE HCL PF 1 MG/ML IJ SOLN: 1.0000 mg | Freq: Once | INTRAMUSCULAR | Status: DC

## 2013-08-27 MED ORDER — METHYLPREDNISOLONE SODIUM SUCC 40 MG IJ SOLR
40.0000 mg | Freq: Once | INTRAMUSCULAR | Status: DC
  Filled 2013-08-27: qty 1

## 2013-08-27 MED ORDER — AMILORIDE HCL 5 MG PO TABS
10.0000 mg | ORAL_TABLET | Freq: Every day | ORAL | Status: DC
  Administered 2013-08-28: 10 mg via ORAL
  Filled 2013-08-27: qty 2

## 2013-08-27 MED ORDER — METHYLPREDNISOLONE SODIUM SUCC 40 MG IJ SOLR
40.0000 mg | Freq: Once | INTRAMUSCULAR | Status: AC
  Administered 2013-08-27: 40 mg via INTRAVENOUS
  Filled 2013-08-27: qty 1

## 2013-08-27 MED ORDER — OXYCODONE HCL 5 MG PO TABS: 5.0000 mg | ORAL_TABLET | ORAL | Status: DC | PRN

## 2013-08-27 MED ORDER — ONDANSETRON HCL 4 MG PO TABS: 4.0000 mg | ORAL_TABLET | Freq: Four times a day (QID) | ORAL | Status: DC | PRN

## 2013-08-27 MED ORDER — ACETAMINOPHEN 650 MG RE SUPP: 650.0000 mg | Freq: Four times a day (QID) | RECTAL | Status: DC | PRN

## 2013-08-27 MED ORDER — PANTOPRAZOLE SODIUM 40 MG PO TBEC
40.0000 mg | DELAYED_RELEASE_TABLET | Freq: Every day | ORAL | Status: DC
  Administered 2013-08-27 – 2013-08-28 (×2): 40 mg via ORAL
  Filled 2013-08-27 (×2): qty 1

## 2013-08-27 MED ORDER — BARIUM SULFATE 2.1 % PO SUSP: 450.0000 mL | ORAL | Status: DC

## 2013-08-27 MED ORDER — TAMSULOSIN HCL 0.4 MG PO CAPS
0.8000 mg | ORAL_CAPSULE | Freq: Every day | ORAL | Status: DC
  Administered 2013-08-27: 0.8 mg via ORAL
  Filled 2013-08-27 (×2): qty 2

## 2013-08-27 MED ORDER — MORPHINE SULFATE 2 MG/ML IJ SOLN: 1.0000 mg | INTRAMUSCULAR | Status: DC | PRN

## 2013-08-27 MED ORDER — METRONIDAZOLE IN NACL 5-0.79 MG/ML-% IV SOLN
500.0000 mg | Freq: Once | INTRAVENOUS | Status: DC
  Filled 2013-08-27: qty 100

## 2013-08-27 MED ORDER — DIPHENHYDRAMINE HCL 50 MG PO CAPS
50.0000 mg | ORAL_CAPSULE | Freq: Once | ORAL | Status: DC
  Filled 2013-08-27: qty 1

## 2013-08-27 MED ORDER — SODIUM CHLORIDE 0.9 % IV SOLN
Freq: Once | INTRAVENOUS | Status: AC
Start: 2013-08-27 — End: 2013-08-27
  Administered 2013-08-27: 06:00:00 via INTRAVENOUS

## 2013-08-27 MED ORDER — PREDNISONE 50 MG PO TABS
50.0000 mg | ORAL_TABLET | Freq: Once | ORAL | Status: DC
  Filled 2013-08-27: qty 1

## 2013-08-27 MED ORDER — SODIUM CHLORIDE 0.9 % IV BOLUS (SEPSIS)
1000.0000 mL | Freq: Once | INTRAVENOUS | Status: AC
  Administered 2013-08-27: 1000 mL via INTRAVENOUS

## 2013-08-27 MED ORDER — WARFARIN SODIUM 3 MG PO TABS
3.0000 mg | ORAL_TABLET | Freq: Once | ORAL | Status: AC
  Administered 2013-08-27: 3 mg via ORAL
  Filled 2013-08-27: qty 1

## 2013-08-27 MED ORDER — METHYLPREDNISOLONE SODIUM SUCC 40 MG IJ SOLR
40.0000 mg | Freq: Once | INTRAMUSCULAR | Status: AC
  Administered 2013-08-28: 40 mg via INTRAVENOUS
  Filled 2013-08-27: qty 1

## 2013-08-27 MED ORDER — SODIUM CHLORIDE 0.9 % IJ SOLN: 3.0000 mL | Freq: Two times a day (BID) | INTRAMUSCULAR | Status: DC

## 2013-08-27 MED ORDER — DEXTROSE 5 % IV SOLN: 1.0000 g | Freq: Once | INTRAVENOUS | Status: DC

## 2013-08-27 MED ORDER — ONDANSETRON HCL 4 MG/2ML IJ SOLN
4.0000 mg | Freq: Four times a day (QID) | INTRAMUSCULAR | Status: DC | PRN
  Filled 2013-08-27: qty 2

## 2013-08-27 MED ORDER — DIPHENHYDRAMINE HCL 50 MG PO CAPS
50.0000 mg | ORAL_CAPSULE | Freq: Once | ORAL | Status: DC
Start: 2013-08-27 — End: 2013-08-27
  Filled 2013-08-27: qty 1

## 2013-08-27 MED ORDER — DIPHENHYDRAMINE HCL 50 MG/ML IJ SOLN: 50.0000 mg | Freq: Once | INTRAMUSCULAR | Status: DC

## 2013-08-27 MED ORDER — ACETAMINOPHEN 325 MG PO TABS: 650.0000 mg | ORAL_TABLET | Freq: Four times a day (QID) | ORAL | Status: DC | PRN

## 2013-08-27 MED ORDER — WARFARIN - PHARMACIST DOSING INPATIENT: Freq: Every day | Status: DC

## 2013-08-27 MED ORDER — SODIUM CHLORIDE 0.9 % IV SOLN
150.0000 mg | Freq: Once | INTRAVENOUS | Status: DC
Start: 2013-08-27 — End: 2013-08-27
  Filled 2013-08-27: qty 1.2

## 2013-08-27 MED ORDER — DOCUSATE SODIUM 100 MG PO CAPS
100.0000 mg | ORAL_CAPSULE | Freq: Two times a day (BID) | ORAL | Status: DC
  Administered 2013-08-27 – 2013-08-28 (×3): 100 mg via ORAL
  Filled 2013-08-27 (×4): qty 1

## 2013-08-27 NOTE — ED Provider Notes (Signed)
CSN: [id number]     Arrival date & time 08/27/13  0207 History   First MD Initiated Contact with Patient 08/27/13 0259     Chief Complaint  Patient presents with  . Urinary Retention     (Consider location/radiation/quality/duration/timing/severity/associated sxs/prior Treatment) HPI Comments: Pt comes in with cc of abdominal pain and urinary retention. Pt has hx of chronic indwelling foley catheter, DVT on coumadin, CHF, prostate CA, s/p treatment. Pt reports having abd pain that started in the afternoon. His foley catheter stopped draining after 7 pm, and his pain continued to get worse. There is no n/v/f/c/diarrhea. Pt has no hx of abdominal surgery. Pt has hx of complicated UTI with psuedomonal infection.   The history is provided by the patient and medical records.    Past Medical History  Diagnosis Date  . Generalized weakness   . Hypokalemia   . Renal disorder   . Hyperlipidemia   . Vitamin D deficiency   . Bell's palsy   . Lumbar stenosis   . Hypertension   . Depression   . Dementia   . Prostate cancer ~ 2004    "had radiation tx" (11/24/2012)  . DVT (deep venous thrombosis) 11/24/2012    Acute DVT in the rightcommon femoral vein and acute DVT involving the leftcommon femorall,profinda popliteal and posterior tibial vein 2013. Pt seen by hematology in 2014. W/S revealed continued clot in left leg  . Asthma     "as a child, real bad" (11/24/2012)  . History of bleeding ulcers ~ 1956    "in hospital for ~ 1 month" (11/24/2012)  . Recurrent UTI (urinary tract infection) 2013-now    "lots" (11/24/2012)  . DJD (degenerative joint disease)   . Arthritis     "joints" (11/24/2012)  . Anxiety   . Hyperaldosteronism   . CHF (congestive heart failure)     Diastolic dysfunction  . Vitamin B12 deficiency   . Spinal stenosis     Dr Christella Noa  . Dilated aortic root   . Mild aortic stenosis     echo 2012   Past Surgical History  Procedure Laterality Date  . Total knee  arthroplasty Left 02/2010  . Back surgery  2008    "for stenosis" (11/24/2012)  . Esophagogastroduodenoscopy Left 02/16/2013    Procedure: ESOPHAGOGASTRODUODENOSCOPY (EGD);  Surgeon: Arta Silence, MD;  Location: [facility] ENDOSCOPY;  Service: Endoscopy;  Laterality: Left;   Family History  Problem Relation Age of Onset  . Heart disease Mother   . Diabetes Father    History  Substance Use Topics  . Smoking status: Former Smoker -- 10 years    Types: Cigarettes  . Smokeless tobacco: Never Used     Comment: 11/24/2012 "quit smoking cigarettes in ~ 1956"  . Alcohol Use: Yes     Comment: occasoinal wine    Review of Systems  Constitutional: Negative for fever, chills and activity change.  Eyes: Negative for visual disturbance.  Respiratory: Negative for cough, chest tightness and shortness of breath.   Cardiovascular: Negative for chest pain.  Gastrointestinal: Positive for abdominal pain. Negative for nausea, vomiting and abdominal distention.  Genitourinary: Positive for decreased urine volume and difficulty urinating. Negative for dysuria, hematuria, flank pain and enuresis.  Musculoskeletal: Negative for arthralgias and neck pain.  Neurological: Positive for dizziness. Negative for light-headedness and headaches.  Psychiatric/Behavioral: Negative for confusion.      Allergies  Contrast media and Iohexol  Home Medications   Prior to Admission medications   Medication Sig  Start Date End Date Taking? Authorizing Provider  ALPRAZolam Duanne Moron) 0.5 MG tablet Take 0.5 mg by mouth 3 (three) times daily as needed for anxiety. For anxiety.   Yes Historical Provider, MD  aMILoride (MIDAMOR) 5 MG tablet Take 10 mg by mouth daily.   Yes Historical Provider, MD  amLODipine (NORVASC) 10 MG tablet Take 10 mg by mouth daily.   Yes Historical Provider, MD  Calcium-Vitamin D (CALTRATE 600 PLUS-VIT D PO) Take 1 tablet by mouth daily.    Yes Historical Provider, MD  cholecalciferol (VITAMIN D) 1000  UNITS tablet Take 1,000 Units by mouth daily.   Yes Historical Provider, MD  citalopram (CELEXA) 20 MG tablet Take 20 mg by mouth daily. 11/11/12  Yes Historical Provider, MD  Cyanocobalamin (VITAMIN B 12 PO) Take 1,000 mg by mouth daily.   Yes Historical Provider, MD  ferrous sulfate 324 (65 FE) MG TBEC Take 1 tablet by mouth daily.    Yes Historical Provider, MD  Glucosamine-Chondroit-Vit C-Mn (GLUCOSAMINE CHONDR 1500 COMPLX) CAPS Take 1 capsule by mouth daily.   Yes Historical Provider, MD  pantoprazole (PROTONIX) 40 MG tablet Take 40 mg by mouth daily.   Yes Historical Provider, MD  potassium chloride SA (K-DUR,KLOR-CON) 20 MEQ tablet Take 20 mEq by mouth 2 (two) times daily.   Yes Historical Provider, MD  Tamsulosin HCl (FLOMAX) 0.4 MG CAPS Take 0.8 mg by mouth at bedtime.   Yes Historical Provider, MD  warfarin (COUMADIN) 2 MG tablet Take 2 mg by mouth at bedtime.    Yes Historical Provider, MD   BP 142/89  Pulse 101  Temp(Src) 97.6 F (36.4 C) (Oral)  Resp 22  SpO2 99% Physical Exam  Nursing note and vitals reviewed. Constitutional: He is oriented to person, place, and time. He appears well-developed.  HENT:  Head: Normocephalic and atraumatic.  Eyes: Conjunctivae and EOM are normal. Pupils are equal, round, and reactive to light.  Neck: Normal range of motion. Neck supple.  Cardiovascular: Normal rate and regular rhythm.   Pulmonary/Chest: Effort normal and breath sounds normal.  Abdominal: Soft. Bowel sounds are normal. He exhibits distension. There is tenderness. There is no rebound and no guarding.  Diffuse tenderness of the abdomen  Neurological: He is alert and oriented to person, place, and time.  Skin: Skin is warm.    ED Course  Procedures (including critical care time) Labs Review Labs Reviewed  CBC WITH DIFFERENTIAL - Abnormal; Notable for the following:    Hemoglobin 12.2 (*)    HCT 37.9 (*)    Neutrophils Relative % 89 (*)    Neutro Abs 8.9 (*)     Lymphocytes Relative 4 (*)    Lymphs Abs 0.4 (*)    All other components within normal limits  COMPREHENSIVE METABOLIC PANEL - Abnormal; Notable for the following:    Glucose, Bld 123 (*)    GFR calc non Af Amer 57 (*)    GFR calc Af Amer 66 (*)    Anion gap 20 (*)    All other components within normal limits  URINALYSIS, ROUTINE W REFLEX MICROSCOPIC - Abnormal; Notable for the following:    APPearance TURBID (*)    Hgb urine dipstick LARGE (*)    Protein, ur 100 (*)    Leukocytes, UA LARGE (*)    All other components within normal limits  URINE MICROSCOPIC-ADD ON - Abnormal; Notable for the following:    Bacteria, UA MANY (*)    Crystals TRIPLE PHOSPHATE CRYSTALS (*)  All other components within normal limits  I-STAT VENOUS BLOOD GAS, ED - Abnormal; Notable for the following:    pH, Ven 7.418 (*)    pCO2, Ven 40.2 (*)    pO2, Ven 25.0 (*)    Bicarbonate 26.0 (*)    All other components within normal limits  I-STAT CG4 LACTIC ACID, ED - Abnormal; Notable for the following:    Lactic Acid, Venous 6.98 (*)    All other components within normal limits  URINE CULTURE  CULTURE, BLOOD (ROUTINE X 2)  CULTURE, BLOOD (ROUTINE X 2)  LIPASE, BLOOD  APTT  PROTIME-INR  I-STAT CG4 LACTIC ACID, ED    Imaging Review No results found.   EKG Interpretation None      MDM   Final diagnoses:  None    DDx includes: AAA Tumors Colitis Intra abdominal abscess Thrombosis Mesenteric ischemia Diverticulitis Peritonitis Appendicitis Hernia Nephrolithiasis Pyelonephritis UTI/Cystitis   Pt comes in with cc of abd pain and urinary retention and he has chronic indwelling foley catheter. Foley catheter had stopped draining, so we replaced the foley, and pt's pain responded. Labs however showed lactic acidosis, and i reassessed the patient, and he was pain free. UA was showing infection, so cefepime given. However, his pain returned, so we have decided to get CT scan. Pt has  allergy to contrast, so we will pre-treat him. Flagyl added.   With the lactic acidosis, mesenteric ischemia is also possible, but pain is mild, unless we press on the abdomen and there is no rigidity.                 Medicine to admit. CT scan scheduled for 1 pm, and solumedrol given in the ER at 7 am. Pt wull get benadryl at noon.  Varney Biles, MD 08/27/13 [id number]

## 2013-08-27 NOTE — ED Notes (Signed)
MD at bedside. 

## 2013-08-27 NOTE — Progress Notes (Signed)
Attempted report for admission to 5W22  8:17 AM Report received from [place], [place]

## 2013-08-27 NOTE — ED Notes (Addendum)
Pt refuses all MEDS now . Pt states get out of my room you are not my nurse. Family at bed side witnessed Pt statement.

## 2013-08-27 NOTE — Progress Notes (Signed)
Pt's HR elevated in 130's-140's on monitor and running Afib. Md made aware. Orders received. Scheduled 10am medications given including Amlodipine. Pt having anxiety and PRN medication given.  1130: Cardizem arrived from pharmacy and pt's HR currently 98 and sustaining. MD made aware and order to hold Cardizem and call back if HR>120s. Pt currently resting comfortably in bed.

## 2013-08-27 NOTE — ED Notes (Signed)
Lactic acid results given to Dr. Lemar Livings

## 2013-08-27 NOTE — Progress Notes (Signed)
ANTICOAGULATION CONSULT NOTE - Initial Consult  Pharmacy Consult for warfarin Indication: h/o DVT, atrial fibrillation  Allergies  Allergen Reactions  . Contrast Media [Iodinated Diagnostic Agents]     Pt blacked out  . Iohexol      Desc: sob, throat swelling (1988 gdc) pt requires full premeds and does well, JB 8/14/7     Patient Measurements: Height: 5' 7.5" (171.5 cm) Weight: 163 lb 12.8 oz (74.299 kg) IBW/kg (Calculated) : 67.25  Vital Signs: Temp: 97.4 F (36.3 C) (08/16 0911) Temp src: Oral (08/16 0911) BP: 179/89 mmHg (08/16 0911) Pulse Rate: 118 (08/16 0911)  Labs:  Recent Labs  08/27/13 0314 08/27/13 0702  HGB 12.2*  --   HCT 37.9*  --   PLT 202  --   APTT  --  30  LABPROT  --  19.2*  INR  --  1.62*  CREATININE 1.11  --     Estimated Creatinine Clearance: 43.8 ml/min (by C-G formula based on Cr of 1.11).   Medical History: Past Medical History  Diagnosis Date  . Generalized weakness   . Hypokalemia   . Renal disorder   . Hyperlipidemia   . Vitamin D deficiency   . Bell's palsy   . Lumbar stenosis   . Hypertension   . Depression   . Dementia   . Prostate cancer ~ 2004    "had radiation tx" (11/24/2012)  . DVT (deep venous thrombosis) 11/24/2012    Acute DVT in the rightcommon femoral vein and acute DVT involving the leftcommon femorall,profinda popliteal and posterior tibial vein 2013. Pt seen by hematology in 2014. W/S revealed continued clot in left leg  . Asthma     "as a child, real bad" (11/24/2012)  . History of bleeding ulcers ~ 1956    "in hospital for ~ 1 month" (11/24/2012)  . Recurrent UTI (urinary tract infection) 2013-now    "lots" (11/24/2012)  . DJD (degenerative joint disease)   . Arthritis     "joints" (11/24/2012)  . Anxiety   . Hyperaldosteronism   . CHF (congestive heart failure)     Diastolic dysfunction  . Vitamin B12 deficiency   . Spinal stenosis     Dr Christella Noa  . Dilated aortic root   . Mild aortic stenosis      echo 2012    Medications:  Scheduled:  . [START ON 08/28/2013] aMILoride  10 mg Oral Daily  . amLODipine  10 mg Oral Daily  . ceFEPime (MAXIPIME) IV  1 g Intravenous Q12H  . citalopram  20 mg Oral Daily  . diltiazem  5 mg Intravenous Once  . diphenhydrAMINE  50 mg Oral Once  . diphenhydrAMINE  50 mg Oral Once  . docusate sodium  100 mg Oral BID  . methylPREDNISolone (SOLU-MEDROL) injection  40 mg Intravenous Once   Followed by  . methylPREDNISolone (SOLU-MEDROL) injection  40 mg Intravenous Once  . pantoprazole  40 mg Oral Daily  . [START ON 08/28/2013] potassium chloride SA  20 mEq Oral BID  . sodium chloride  3 mL Intravenous Q12H  . tamsulosin  0.8 mg Oral QHS  . warfarin  3 mg Oral ONCE-1800  . Warfarin - Pharmacist Dosing Inpatient   Does not apply q1800    Assessment: 80 yo m presenting on 8/16 with c/o abdominal pain and unable to pass urine through foley. Patient with afib and h/o DVT (Sept 2014) on warfarin at home. Home warfarin dose is 2 mg daily.  INR today 1.62  Goal of Therapy:  INR 2-3 Monitor platelets by anticoagulation protocol: Yes   Plan:  Warfarin 3 mg PO x 1 Daily INR Monitor CBC  Cassie L. Nicole Kindred, PharmD Clinical Pharmacy Resident Pager: [phone] 08/27/2013 9:49 AM

## 2013-08-27 NOTE — Progress Notes (Signed)
Patient  Please on box 11 and Meeky called at [facility]

## 2013-08-27 NOTE — Progress Notes (Signed)
Pt is agitated and having anxiety. PRN xanax given. HR sustaining in 120s-130s on monitor. MD made aware. Orders received. Will watch HR and notify MD if it continues to stay elevated following prn haldol administration.

## 2013-08-27 NOTE — Progress Notes (Signed)
Joshua Rodgers [id number] Code Status: DNR  Admission Data: 08/27/2013 9:25 AM Attending Provider: Dr.  [place] Pink AES:LPNPYY,FRTMYT D, MD Consults/ Treatment Team:  [facility]  Joshua Rodgers is a 80 y.o. male patient admitted from ED awake, alert - oriented  X 1- no acute distress noted.  VSS - Blood pressure 179/89, pulse 118, temperature 97.4 F (36.3 C), temperature source Oral, resp. rate 18, height 5' 7.5" (1.715 m), weight 74.299 kg (163 lb 12.8 oz), SpO2 99.00%.   Running AFib on heart monitor. Md made aware.  IV in place, occlusive dsg intact without redness.  Orientation to room, and floor completed with information packet given to patient/family.  Patient declined safety video at this time.  Admission INP armband ID verified with patient/family, and in place.   SR up x 2, fall assessment complete, with patient and family able to verbalize understanding of risk associated with falls, and verbalized understanding to call nsg before up out of bed.  Call light within reach, patient able to voice, and demonstrate understanding.  2x2 wound on buttock, clean, dry and intact.    Belonging and medications sent home with patient's wife.   Will cont to eval and treat per MD orders.  Delman Cheadle, RN 08/27/2013 9:25 AM

## 2013-08-27 NOTE — ED Notes (Signed)
Patient here with complaint of urinary retention starting last night. Patient has indwelling foley catheter which has been in place for about 1 years per family. Family states that the bag was full approximately 102mL around 1900. After emptying the bag, family reports little to no output since that time. Patient also complaining of some lower abdominal pain.

## 2013-08-27 NOTE — H&P (Signed)
[facility] History and Physical  Joshua Rodgers MEQ:[id number] DOB: June 25, 1923 DOA: 08/27/2013   PCP: Kandice Hams, MD  Specialists: He is followed by Dr. Janice Norrie with urology  Chief Complaint: Abdominal pain, and unable to pass urine through his Foley  HPI: Joshua Rodgers is a 80 y.o. male with a past medical history of dementia, diastolic dysfunction, paroxysmal atrial fibrillation, history of DVT on anticoagulation, history of prostate cancer status post surgery, chronic indwelling Foley who was last hospitalized in June for Pseudomonas UTI, who presented to the hospital overnight with complaints of lower abdominal pain, and not able to pass urine through his Foley. His symptoms started Saturday morning and has been progressively getting worse. There was no nausea, vomiting. Wife does report 2- 3 episodes of loose stools. He's had chills and possibly had fever with sweating, but the wife was unable to check his temperature. She tells me that his Foley was last changed about 2 weeks ago. She also reports that the patient has severe lumbar stenosis and has lower extremity weakness chronically, and as result of which he is unable to ambulate. He gets around in a wheelchair. Patient has dementia and is exhibiting certain aggressive behavior at times and no history was available from him. So history is somewhat limited.  Home Medications: Prior to Admission medications   Medication Sig Start Date End Date Taking? Authorizing Provider  ALPRAZolam Duanne Moron) 0.5 MG tablet Take 0.5 mg by mouth 3 (three) times daily as needed for anxiety. For anxiety.   Yes Historical Provider, MD  aMILoride (MIDAMOR) 5 MG tablet Take 10 mg by mouth daily.   Yes Historical Provider, MD  amLODipine (NORVASC) 10 MG tablet Take 10 mg by mouth daily.   Yes Historical Provider, MD  Calcium-Vitamin D (CALTRATE 600 PLUS-VIT D PO) Take 1 tablet by mouth daily.    Yes Historical Provider, MD  cholecalciferol (VITAMIN D)  1000 UNITS tablet Take 1,000 Units by mouth daily.   Yes Historical Provider, MD  citalopram (CELEXA) 20 MG tablet Take 20 mg by mouth daily. 11/11/12  Yes Historical Provider, MD  Cyanocobalamin (VITAMIN B 12 PO) Take 1,000 mg by mouth daily.   Yes Historical Provider, MD  ferrous sulfate 324 (65 FE) MG TBEC Take 1 tablet by mouth daily.    Yes Historical Provider, MD  Glucosamine-Chondroit-Vit C-Mn (GLUCOSAMINE CHONDR 1500 COMPLX) CAPS Take 1 capsule by mouth daily.   Yes Historical Provider, MD  pantoprazole (PROTONIX) 40 MG tablet Take 40 mg by mouth daily.   Yes Historical Provider, MD  potassium chloride SA (K-DUR,KLOR-CON) 20 MEQ tablet Take 20 mEq by mouth 2 (two) times daily.   Yes Historical Provider, MD  Tamsulosin HCl (FLOMAX) 0.4 MG CAPS Take 0.8 mg by mouth at bedtime.   Yes Historical Provider, MD  warfarin (COUMADIN) 2 MG tablet Take 2 mg by mouth at bedtime.    Yes Historical Provider, MD    Allergies:  Allergies  Allergen Reactions  . Contrast Media [Iodinated Diagnostic Agents]     Pt blacked out  . Iohexol      Desc: sob, throat swelling (1988 gdc) pt requires full premeds and does well, JB 8/14/7     Past Medical History: Past Medical History  Diagnosis Date  . Generalized weakness   . Hypokalemia   . Renal disorder   . Hyperlipidemia   . Vitamin D deficiency   . Bell's palsy   . Lumbar stenosis   . Hypertension   .  Depression   . Dementia   . Prostate cancer ~ 2004    "had radiation tx" (11/24/2012)  . DVT (deep venous thrombosis) 11/24/2012    Acute DVT in the rightcommon femoral vein and acute DVT involving the leftcommon femorall,profinda popliteal and posterior tibial vein 2013. Pt seen by hematology in 2014. W/S revealed continued clot in left leg  . Asthma     "as a child, real bad" (11/24/2012)  . History of bleeding ulcers ~ 1956    "in hospital for ~ 1 month" (11/24/2012)  . Recurrent UTI (urinary tract infection) 2013-now    "lots"  (11/24/2012)  . DJD (degenerative joint disease)   . Arthritis     "joints" (11/24/2012)  . Anxiety   . Hyperaldosteronism   . CHF (congestive heart failure)     Diastolic dysfunction  . Vitamin B12 deficiency   . Spinal stenosis     Dr Christella Noa  . Dilated aortic root   . Mild aortic stenosis     echo 2012    Past Surgical History  Procedure Laterality Date  . Total knee arthroplasty Left 02/2010  . Back surgery  2008    "for stenosis" (11/24/2012)  . Esophagogastroduodenoscopy Left 02/16/2013    Procedure: ESOPHAGOGASTRODUODENOSCOPY (EGD);  Surgeon: Arta Silence, MD;  Location: [facility] ENDOSCOPY;  Service: Endoscopy;  Laterality: Left;    Social History: Lives with his wife in [place] about 6 miles from [place]. Quit smoking more than 60 years ago. Very occasional alcohol use. No illicit drug use. Uses a wheelchair to get around.  Family History:  Family History  Problem Relation Age of Onset  . Heart disease Mother   . Diabetes Father      Review of Systems - unable to do due to his dementia  Physical Examination  Filed Vitals:   08/27/13 0600 08/27/13 0615 08/27/13 0630 08/27/13 0645  BP: 141/100 144/93 134/85 172/89  Pulse: 101 98 98 94  Temp:      TempSrc:      Resp: 22 21 21 21   SpO2: 100% 100% 100% 100%    BP 172/89  Pulse 94  Temp(Src) 97.6 F (36.4 C) (Oral)  Resp 21  SpO2 100%  General appearance: alert, combative, distracted, no distress and uncooperative Head: Normocephalic, without obvious abnormality, atraumatic Eyes: conjunctivae/corneas clear. PERRL, EOM's intact.  Throat: dry mm Neck: no adenopathy, no carotid bruit, no JVD, supple, symmetrical, trachea midline and thyroid not enlarged, symmetric, no tenderness/mass/nodules Resp: clear to auscultation bilaterally Cardio: S2 is irregularly irregular. No S3, S4. Systolic murmur appreciated over the precordium. No rubs, or bruit GI: Abdomen is soft. I couldn't elicit much tenderness. No masses  or organomegaly. Bowel sounds are present. Foley catheter noted Extremities: Edema noted bilateral lower extremities. Pulses: 2+ and symmetric Skin: Skin color, texture, turgor normal. No rashes or lesions Lymph nodes: Cervical, supraclavicular, and axillary nodes normal. Neurologic: He is alert. He is combative. No obvious facial droop. Moving both his upper extremities. Not willing to move his lower extremities. He kept pushing me away.  Laboratory Data: Results for orders placed during the hospital encounter of 08/27/13 (from the past 48 hour(s))  CBC WITH DIFFERENTIAL     Status: Abnormal   Collection Time    08/27/13  3:14 AM      Result Value Ref Range   WBC 10.0  4.0 - 10.5 K/uL   RBC 4.22  4.22 - 5.81 MIL/uL   Hemoglobin 12.2 (*) 13.0 - 17.0 g/dL  HCT 37.9 (*) 39.0 - 52.0 %   MCV 89.8  78.0 - 100.0 fL   MCH 28.9  26.0 - 34.0 pg   MCHC 32.2  30.0 - 36.0 g/dL   RDW 14.9  11.5 - 15.5 %   Platelets 202  150 - 400 K/uL   Neutrophils Relative % 89 (*) 43 - 77 %   Neutro Abs 8.9 (*) 1.7 - 7.7 K/uL   Lymphocytes Relative 4 (*) 12 - 46 %   Lymphs Abs 0.4 (*) 0.7 - 4.0 K/uL   Monocytes Relative 7  3 - 12 %   Monocytes Absolute 0.7  0.1 - 1.0 K/uL   Eosinophils Relative 0  0 - 5 %   Eosinophils Absolute 0.0  0.0 - 0.7 K/uL   Basophils Relative 0  0 - 1 %   Basophils Absolute 0.0  0.0 - 0.1 K/uL  COMPREHENSIVE METABOLIC PANEL     Status: Abnormal   Collection Time    08/27/13  3:14 AM      Result Value Ref Range   Sodium 147  137 - 147 mEq/L   Potassium 4.1  3.7 - 5.3 mEq/L   Chloride 104  96 - 112 mEq/L   CO2 23  19 - 32 mEq/L   Glucose, Bld 123 (*) 70 - 99 mg/dL   BUN 17  6 - 23 mg/dL   Creatinine, Ser 1.11  0.50 - 1.35 mg/dL   Calcium 10.0  8.4 - 10.5 mg/dL   Total Protein 6.9  6.0 - 8.3 g/dL   Albumin 3.8  3.5 - 5.2 g/dL   AST 18  0 - 37 U/L   ALT 8  0 - 53 U/L   Alkaline Phosphatase 57  39 - 117 U/L   Total Bilirubin 0.4  0.3 - 1.2 mg/dL   GFR calc non Af Amer 57  (*) >90 mL/min   GFR calc Af Amer 66 (*) >90 mL/min   Comment: (NOTE)     The eGFR has been calculated using the CKD EPI equation.     This calculation has not been validated in all clinical situations.     eGFR's persistently <90 mL/min signify possible Chronic Kidney     Disease.   Anion gap 20 (*) 5 - 15  LIPASE, BLOOD     Status: None   Collection Time    08/27/13  3:14 AM      Result Value Ref Range   Lipase 26  11 - 59 U/L  I-STAT CG4 LACTIC ACID, ED     Status: Abnormal   Collection Time    08/27/13  3:21 AM      Result Value Ref Range   Lactic Acid, Venous 6.98 (*) 0.5 - 2.2 mmol/L  I-STAT VENOUS BLOOD GAS, ED     Status: Abnormal   Collection Time    08/27/13  3:22 AM      Result Value Ref Range   pH, Ven 7.418 (*) 7.250 - 7.300   pCO2, Ven 40.2 (*) 45.0 - 50.0 mmHg   pO2, Ven 25.0 (*) 30.0 - 45.0 mmHg   Bicarbonate 26.0 (*) 20.0 - 24.0 mEq/L   TCO2 27  0 - 100 mmol/L   O2 Saturation 46.0     Acid-Base Excess 1.0  0.0 - 2.0 mmol/L   Sample type VENOUS     Comment NOTIFIED PHYSICIAN    URINALYSIS, ROUTINE W REFLEX MICROSCOPIC     Status: Abnormal   Collection  Time    08/27/13  3:41 AM      Result Value Ref Range   Color, Urine YELLOW  YELLOW   APPearance TURBID (*) CLEAR   Specific Gravity, Urine 1.018  1.005 - 1.030   pH 7.5  5.0 - 8.0   Glucose, UA NEGATIVE  NEGATIVE mg/dL   Hgb urine dipstick LARGE (*) NEGATIVE   Bilirubin Urine NEGATIVE  NEGATIVE   Ketones, ur NEGATIVE  NEGATIVE mg/dL   Protein, ur 100 (*) NEGATIVE mg/dL   Urobilinogen, UA 0.2  0.0 - 1.0 mg/dL   Nitrite NEGATIVE  NEGATIVE   Leukocytes, UA LARGE (*) NEGATIVE  URINE MICROSCOPIC-ADD ON     Status: Abnormal   Collection Time    08/27/13  3:41 AM      Result Value Ref Range   Squamous Epithelial / LPF RARE  RARE   WBC, UA TOO NUMEROUS TO COUNT  <3 WBC/hpf   RBC / HPF 21-50  <3 RBC/hpf   Bacteria, UA MANY (*) RARE   Crystals TRIPLE PHOSPHATE CRYSTALS (*) NEGATIVE  APTT     Status: None    Collection Time    08/27/13  7:02 AM      Result Value Ref Range   aPTT 30  24 - 37 seconds  PROTIME-INR     Status: Abnormal   Collection Time    08/27/13  7:02 AM      Result Value Ref Range   Prothrombin Time 19.2 (*) 11.6 - 15.2 seconds   INR 1.62 (*) 0.00 - 1.49  I-STAT CG4 LACTIC ACID, ED     Status: Abnormal   Collection Time    08/27/13  7:08 AM      Result Value Ref Range   Lactic Acid, Venous 2.40 (*) 0.5 - 2.2 mmol/L    Radiology Reports: No results found.   Problem List  Principal Problem:   Abdominal pain Active Problems:   Lumbar stenosis   Dementia   Bladder, atonic   Toxic encephalopathy   UTI (lower urinary tract infection)   PAF (paroxysmal atrial fibrillation)   Chronic diastolic CHF (congestive heart failure)   Lactic acid acidosis   Assessment: This is a 80 year old, African American male, who presents with lower abdominal pain, and urinary retention despite having a Foley. His symptoms have significantly improved after Foley catheter was changed. However, his initial lactic acid level was extremely elevated. Subsequent levels have improved. He was having some abdominal pain, so, intra-abdominal pathology needs to be ruled out. However, his abdomen appears to be benign at this time. UA does suggest UTI.  Plan: #1 abdominal pain with urinary retention and UTI: Foley catheter has been replaced. UA has been obtained. UA is abnormal, suggesting UTI or chronic colonization. Urine cultures will be followed up on. Reason for his retention despite Foley is not clear. So, he could have significant infection. Because his lactic acid was elevated he needs an abdominal CT. However, he is allergic to contrast dye. There is no emergent need to do the CT scan considering benign examination so we will follow the 13 hour protocol for premedication. CT scan will be scheduled for 8 PM tonight. Patient seems comfortable at this time. He will be given cefepime. Previous  hospitalization, he grew pseudomonas which was sensitive to cefepime. He was discharged on intramuscular gentamicin at that time. We will notify his urologist but not necessary to officially consult them at this time.   #2 lactic acidosis: Etiology unclear, but  possibly related to infection, and hypovolemia. Intra-abdominal process needs to be ruled out. However reassuringly the lactic acid, has decreased significantly. CT scan will be followed up on. He will be gently hydrated. Lactic acid level will be repeated in the morning. Treat his infection as discussed above.  #3 dementia with certain behavioral changes: He could have superimposed encephalopathy due to his infection. Haldol as needed. Continue to monitor closely.   #4 history of diastolic dysfunction: Gentle IV hydration. Monitor for volume overload.  #5 paroxysmal atrial fibrillation: He'll be monitored on telemetry due to his infection and mild tachycardia. He has not tolerated beta blockers in the past due to bradycardia and syncope. Hold off on any rate limiting drugs for now. Hopefully with hydration his heart rate should get controlled. He is on anticoagulation as discussed below.  #6 history of LE DVT in Sept 2014 on anticoagulation: INR is subtherapeutic. We will have pharmacy dose warfarin. At this time there is no clear indication for bridging as his clot was diagnosed more than 11 months ago.   #6 history of lumbar stenosis with inability to ambulate: PT to follow. Fall precautions  #7 history of prostate cancer status post prostatectomy and chronic indwelling Foley catheter: Foley is in place. He will need followup with his urologist in the coming week.   DVT Prophylaxis: He is on warfarin Code Status: DO NOT RESUSCITATE as discussed with his wife Family Communication: Discussed with his wife  Disposition Plan: Admit to telemetry   Further management decisions will depend on results of further testing and patient's response  to treatment.   [facility]  [facility] Pager [phone]  If 7PM-7AM, please contact night-coverage [email or web address] Password [facility]  08/27/2013, 7:54 AM  Disclaimer: This note was dictated with voice recognition software. Similar sounding words can inadvertently be transcribed and may not be corrected upon review.

## 2013-08-28 ENCOUNTER — Inpatient Hospital Stay (HOSPITAL_COMMUNITY): Payer: Medicare PPO

## 2013-08-28 ENCOUNTER — Encounter (HOSPITAL_COMMUNITY): Payer: Self-pay | Admitting: Radiology

## 2013-08-28 DIAGNOSIS — R339 Retention of urine, unspecified: Secondary | ICD-10-CM

## 2013-08-28 DIAGNOSIS — I4891 Unspecified atrial fibrillation: Secondary | ICD-10-CM

## 2013-08-28 LAB — CBC
HEMATOCRIT: 33.2 % — AB (ref 39.0–52.0)
Hemoglobin: 10.6 g/dL — ABNORMAL LOW (ref 13.0–17.0)
MCH: 28.5 pg (ref 26.0–34.0)
MCHC: 31.9 g/dL (ref 30.0–36.0)
MCV: 89.2 fL (ref 78.0–100.0)
Platelets: 165 10*3/uL (ref 150–400)
RBC: 3.72 MIL/uL — ABNORMAL LOW (ref 4.22–5.81)
RDW: 15.1 % (ref 11.5–15.5)
WBC: 4.8 10*3/uL (ref 4.0–10.5)

## 2013-08-28 LAB — COMPREHENSIVE METABOLIC PANEL
ALK PHOS: 47 U/L (ref 39–117)
ALT: 7 U/L (ref 0–53)
AST: 13 U/L (ref 0–37)
Albumin: 3.1 g/dL — ABNORMAL LOW (ref 3.5–5.2)
Anion gap: 11 (ref 5–15)
BILIRUBIN TOTAL: 0.3 mg/dL (ref 0.3–1.2)
BUN: 20 mg/dL (ref 6–23)
CO2: 27 mEq/L (ref 19–32)
CREATININE: 1.21 mg/dL (ref 0.50–1.35)
Calcium: 9.1 mg/dL (ref 8.4–10.5)
Chloride: 108 mEq/L (ref 96–112)
GFR, EST AFRICAN AMERICAN: 60 mL/min — AB (ref >90)
GFR, EST NON AFRICAN AMERICAN: 52 mL/min — AB (ref >90)
GLUCOSE: 121 mg/dL — AB (ref 70–99)
Potassium: 3.6 mEq/L — ABNORMAL LOW (ref 3.7–5.3)
SODIUM: 146 meq/L (ref 137–147)
TOTAL PROTEIN: 5.9 g/dL — AB (ref 6.0–8.3)

## 2013-08-28 LAB — PROTIME-INR
INR: 2.01 — ABNORMAL HIGH (ref 0.00–1.49)
Prothrombin Time: 22.8 seconds — ABNORMAL HIGH (ref 11.6–15.2)

## 2013-08-28 LAB — LACTIC ACID, PLASMA: Lactic Acid, Venous: 1.4 mmol/L (ref 0.5–2.2)

## 2013-08-28 MED ORDER — DIPHENHYDRAMINE HCL 50 MG/ML IJ SOLN
50.0000 mg | Freq: Once | INTRAMUSCULAR | Status: AC
  Administered 2013-08-28: 50 mg via INTRAVENOUS
  Filled 2013-08-28: qty 1

## 2013-08-28 MED ORDER — DSS 100 MG PO CAPS: 100.0000 mg | ORAL_CAPSULE | Freq: Two times a day (BID) | ORAL | Status: AC

## 2013-08-28 MED ORDER — CIPROFLOXACIN HCL 500 MG PO TABS: 500.0000 mg | ORAL_TABLET | Freq: Two times a day (BID) | ORAL | Status: DC

## 2013-08-28 MED ORDER — IOHEXOL 300 MG/ML  SOLN
100.0000 mL | Freq: Once | INTRAMUSCULAR | Status: AC | PRN
  Administered 2013-08-28: 100 mL via INTRAVENOUS

## 2013-08-28 NOTE — Evaluation (Signed)
Occupational Therapy Evaluation Patient Details Name: Joshua Rodgers MRN: [id number] DOB: December 21, 1923 Today's Date: 08/28/2013    History of Present Illness 80 y.o. male presents with lower abdomen pain and inability void bladder. PMH: Dementia, bed bound for 2 years, transfer to w/c stand pivot only, indwelling foley for 1 year, Afib, CA prostate   Clinical Impression   Patient evaluated by Occupational Therapy with no further acute OT needs identified. All education has been completed and the patient has no further questions. See below for any follow-up Occupational Therapy or equipment needs. OT to sign off. Thank you for referral.      Follow Up Recommendations  Home health OT;Supervision/Assistance - 24 hour    Equipment Recommendations  Other (comment) (hoyer lift)    Recommendations for Other Services       Precautions / Restrictions Precautions Precautions: Fall Precaution Comments: pressure wound buttock      Mobility Bed Mobility Overal bed mobility: Needs Assistance Bed Mobility: Rolling;Sidelying to Sit;Supine to Sit;Sit to Supine Rolling: Mod assist (use of bed rail) Sidelying to sit: Max assist;HOB elevated Supine to sit: Max assist;HOB elevated Sit to supine: Max assist   General bed mobility comments: Pt required step by step v/c to sequence task. pt is unable to coordinate transfer without (A). pt unable to static sit on EOB> Pt with strong posterior lean. pt very easily distracted with other staff and visitors in room. Pt unable to progress bil LE to EOB with v/c  Transfers                 General transfer comment: not assessed at this time. Wife requesting patient not transfer to chair. Wife states "we just want to go home today"    Balance Overall balance assessment: Needs assistance Sitting-balance support: Bilateral upper extremity supported;Feet supported Sitting balance-Leahy Scale: Zero   Postural control: Posterior lean                                  ADL Overall ADL's : Needs assistance/impaired;At baseline Eating/Feeding: Minimal assistance;Bed level   Grooming: Total assistance   Upper Body Bathing: Total assistance   Lower Body Bathing: Total assistance                         General ADL Comments: Pt wears adult diaper at home and voids bowels into diaper. Family transfers to 3n1 or bed level to change. pt bathing 3n1 or bed level. Pt demonstrates poor static sitting balance this session. pt with BIL LE weakness and deficits and question ability to weight bear at this time. PT and family likely to need hoyer lift for d/c home.OT contacted PT Shauna to coordinate w/c transfer education with family around noon.      Vision                     Perception     Praxis      Pertinent Vitals/Pain Pain Assessment: No/denies pain     Hand Dominance Right   Extremity/Trunk Assessment Upper Extremity Assessment Upper Extremity Assessment: Overall WFL for tasks assessed   Lower Extremity Assessment Lower Extremity Assessment: LLE deficits/detail;RLE deficits/detail (BIL LE internal rotation of BIL feet) RLE Deficits / Details: stage1 pressure wound at heel, Heel floated on pillow       Communication Communication Communication: No difficulties   Cognition Arousal/Alertness: Awake/alert Behavior During  Therapy: WFL for tasks assessed/performed Overall Cognitive Status: History of cognitive impairments - at baseline                     General Comments       Exercises       Shoulder Instructions      Home Living Family/patient expects to be discharged to:: Private residence Living Arrangements: Spouse/significant other Available Help at Discharge: Family;Available 24 hours/day Type of Home: House Home Access: Ramped entrance     Home Layout: One level     Bathroom Shower/Tub:  (sponge bath bed level or on 3n1)         Home Equipment: Wheelchair -  power;Bedside commode;Other (comment)          Prior Functioning/Environment Level of Independence: Needs assistance  Gait / Transfers Assistance Needed: stand pivot to w/c and bedside commode. No ambulation ADL's / Homemaking Assistance Needed: total (A)        OT Diagnosis: Generalized weakness;Cognitive deficits   OT Problem List: Decreased strength;Decreased range of motion;Decreased activity tolerance;Impaired balance (sitting and/or standing);Decreased cognition;Decreased safety awareness;Decreased knowledge of use of DME or AE;Decreased knowledge of precautions   OT Treatment/Interventions:      OT Goals(Current goals can be found in the care plan section)    OT Frequency:     Barriers to D/C:            Co-evaluation              End of Session Nurse Communication: Mobility status;Need for lift equipment;Precautions  Activity Tolerance: Patient tolerated treatment well Patient left: in bed;with call bell/phone within reach;with nursing/sitter in room;with family/visitor present (MD arriving to discuss d/c plan)   Time: 3545-6256 OT Time Calculation (min): 22 min Charges:  OT General Charges $OT Visit: 1 Procedure OT Evaluation $Initial OT Evaluation Tier I: 1 Procedure OT Treatments $Self Care/Home Management : 8-22 mins G-Codes:    Parke Poisson B 13-Sep-2013, 9:06 AM Pager: [phone]

## 2013-08-28 NOTE — Progress Notes (Signed)
Nsg Discharge Note  Admit Date:  08/27/2013 Discharge date: 08/28/2013   BOBAK OGUINN to be D/C'd Home with  homehealth care per MD order.  AVS completed.  Copy for chart, and copy for patient signed, and dated. Patient/caregiver able to verbalize understanding.  Discharge Medication:   Medication List         ALPRAZolam 0.5 MG tablet  Commonly known as:  XANAX  Take 0.5 mg by mouth 3 (three) times daily as needed for anxiety. For anxiety.     aMILoride 5 MG tablet  Commonly known as:  MIDAMOR  Take 10 mg by mouth daily.     amLODipine 10 MG tablet  Commonly known as:  NORVASC  Take 10 mg by mouth daily.     CALTRATE 600 PLUS-VIT D PO  Take 1 tablet by mouth daily.     cholecalciferol 1000 UNITS tablet  Commonly known as:  VITAMIN D  Take 1,000 Units by mouth daily.     ciprofloxacin 500 MG tablet  Commonly known as:  CIPRO  Take 1 tablet (500 mg total) by mouth 2 (two) times daily.     citalopram 20 MG tablet  Commonly known as:  CELEXA  Take 20 mg by mouth daily.     DSS 100 MG Caps  Take 100 mg by mouth 2 (two) times daily.     ferrous sulfate 324 (65 FE) MG Tbec  Take 1 tablet by mouth daily.     GLUCOSAMINE CHONDR 1500 COMPLX Caps  Take 1 capsule by mouth daily.     pantoprazole 40 MG tablet  Commonly known as:  PROTONIX  Take 40 mg by mouth daily.     potassium chloride SA 20 MEQ tablet  Commonly known as:  K-DUR,KLOR-CON  Take 20 mEq by mouth 2 (two) times daily.     tamsulosin 0.4 MG Caps capsule  Commonly known as:  FLOMAX  Take 0.8 mg by mouth at bedtime.     VITAMIN B 12 PO  Take 1,000 mg by mouth daily.     warfarin 2 MG tablet  Commonly known as:  COUMADIN  Take 2 mg by mouth at bedtime.        Discharge Assessment: Filed Vitals:   08/28/13 1058  BP: 124/87  Pulse:   Temp:   Resp:    Skin stage II noted to sacrum area, circular, about the size of a pencil eraser tip. Barrier cream and foam applied prior to discharge.  IV  catheter discontinued intact. Site without signs and symptoms of complications - no redness or edema noted at insertion site, patient denies c/o pain - only slight tenderness at site.  Dressing with slight pressure applied.  D/c Instructions-Education: Discharge instructions given to patient/family with verbalized understanding. D/c education completed with patient/family including follow up instructions, medication list, d/c activities limitations if indicated, with other d/c instructions as indicated by MD - patient able to verbalize understanding, all questions fully answered. Patient instructed to return to ED, call 911, or call MD for any changes in condition.  Patient escorted via [place], and D/C home via private auto.  Dayle Points, RN 08/28/2013 2:41 PM

## 2013-08-28 NOTE — Progress Notes (Signed)
Utilization review completed.  

## 2013-08-28 NOTE — Progress Notes (Signed)
OT NOTE  Family to arrive around 12pm today with w/c from home. PT Joshua Rodgers ( pager [phone]) to assist family with education and transfer to w/c on arrival. Please notify PT when family is present with equipment.  Recommendation for hoyer pending transfer with PT.    Joshua Rodgers   OTR/L Pager: [phone] Office: [phone] .

## 2013-08-28 NOTE — Discharge Summary (Signed)
PATIENT DETAILS Name: Joshua Rodgers Age: 80 y.o. Sex: male Date of Birth: [birth year] MRN: [id number]. Admit Date: 08/27/2013 Admitting Physician: Bonnielee Haff, MD KPT:[id number],EXNTZG D, MD  Recommendations for Outpatient Follow-up:  1. Discharging on 7 days of Ciprofloxacin for recurrent UTI related to chronic indwelling foley catheter 2. Follow up with PCP in 1 week for resolution of symptoms or earlier if symptoms fail to resolve. 3. INR check in 2-3 days.  INR was subtherapeutic on admission. Please monitor closely on Cipro. 4. Follow up with Urologist in 1 week for management of foley catheter   PRIMARY DISCHARGE DIAGNOSIS:  Principal Problem:   Abdominal pain Active Problems:   Lumbar stenosis   Dementia   Bladder, atonic   Toxic encephalopathy   UTI (lower urinary tract infection)   PAF (paroxysmal atrial fibrillation)   Chronic diastolic CHF (congestive heart failure)   Lactic acid acidosis      PAST MEDICAL HISTORY: Past Medical History  Diagnosis Date  . Generalized weakness   . Hypokalemia   . Renal disorder   . Hyperlipidemia   . Vitamin D deficiency   . Bell's palsy   . Lumbar stenosis   . Hypertension   . Depression   . Dementia   . Prostate cancer ~ 2004    "had radiation tx" (11/24/2012)  . DVT (deep venous thrombosis) 11/24/2012    Acute DVT in the rightcommon femoral vein and acute DVT involving the leftcommon femorall,profinda popliteal and posterior tibial vein 2013. Pt seen by hematology in 2014. W/S revealed continued clot in left leg  . Asthma     "as a child, real bad" (11/24/2012)  . History of bleeding ulcers ~ 1956    "in hospital for ~ 1 month" (11/24/2012)  . Recurrent UTI (urinary tract infection) 2013-now    "lots" (11/24/2012)  . DJD (degenerative joint disease)   . Arthritis     "joints" (11/24/2012)  . Anxiety   . Hyperaldosteronism   . CHF (congestive heart failure)     Diastolic dysfunction  . Vitamin B12 deficiency   .  Spinal stenosis     Dr Christella Noa  . Dilated aortic root   . Mild aortic stenosis     echo 2012    DISCHARGE MEDICATIONS:   Medication List         ALPRAZolam 0.5 MG tablet  Commonly known as:  XANAX  Take 0.5 mg by mouth 3 (three) times daily as needed for anxiety. For anxiety.     aMILoride 5 MG tablet  Commonly known as:  MIDAMOR  Take 10 mg by mouth daily.     amLODipine 10 MG tablet  Commonly known as:  NORVASC  Take 10 mg by mouth daily.     CALTRATE 600 PLUS-VIT D PO  Take 1 tablet by mouth daily.     cholecalciferol 1000 UNITS tablet  Commonly known as:  VITAMIN D  Take 1,000 Units by mouth daily.     ciprofloxacin 500 MG tablet  Commonly known as:  CIPRO  Take 1 tablet (500 mg total) by mouth 2 (two) times daily.     citalopram 20 MG tablet  Commonly known as:  CELEXA  Take 20 mg by mouth daily.     DSS 100 MG Caps  Take 100 mg by mouth 2 (two) times daily.     ferrous sulfate 324 (65 FE) MG Tbec  Take 1 tablet by mouth daily.     [place]  Caps  Take 1 capsule by mouth daily.     pantoprazole 40 MG tablet  Commonly known as:  PROTONIX  Take 40 mg by mouth daily.     potassium chloride SA 20 MEQ tablet  Commonly known as:  K-DUR,KLOR-CON  Take 20 mEq by mouth 2 (two) times daily.     tamsulosin 0.4 MG Caps capsule  Commonly known as:  FLOMAX  Take 0.8 mg by mouth at bedtime.     VITAMIN B 12 PO  Take 1,000 mg by mouth daily.     warfarin 2 MG tablet  Commonly known as:  COUMADIN  Take 2 mg by mouth at bedtime.        ALLERGIES:   Allergies  Allergen Reactions  . Contrast Media [Iodinated Diagnostic Agents]     Pt blacked out  . Iohexol      Desc: sob, throat swelling (1988 gdc) pt requires full premeds and does well, JB 8/14/7     BRIEF HPI:  See H&P, Labs, Consult and Test reports for all details.  In brief, patient is 80 y.o. male with a past medical history of dementia, diastolic dysfunction, paroxysmal  atrial fibrillation, history of DVT on anticoagulation, history of prostate cancer s/p surgery, severe lumbar stenosis, chronic indwelling Foley who was last hospitalized in June for Pseudomonas UTI.  He was admitted for abdominal pain and for not being able to pass urine through his foley.  Stated his symptoms started Saturday morning and had been getting progressively worse.  Denied nausea or vomiting, but had 2-3 episodes of loose stools.  Admits to having chills, sweats, and possibly a fever but wasn't checked at home.  Wife at bedside reported that his Foley was last changed 2 weeks ago.  Patient unable to ambulate and is wheelchair bound due to severe lumbar stenosis and chronic lower extremity weakness.    CONSULTATIONS:   None  PERTINENT RADIOLOGIC STUDIES: Ct Abdomen Pelvis W Contrast  08/28/2013   CLINICAL DATA:  Abdominal pain.  EXAM: CT ABDOMEN AND PELVIS WITH CONTRAST  TECHNIQUE: Multidetector CT imaging of the abdomen and pelvis was performed using the standard protocol following bolus administration of intravenous contrast.  CONTRAST:  136mL OMNIPAQUE IOHEXOL 300 MG/ML  SOLN  COMPARISON:  02/12/2013  FINDINGS: BODY WALL: Symmetric gynecomastia. Umbilical hernia. Probable right inguinal hernia repair  LOWER CHEST: Aortic valve calcification/sclerosis. Multi focal coronary artery atherosclerosis.  ABDOMEN/PELVIS:  Liver: Lobulated liver surface related to diaphragmatic indentation. No focal abnormality.  Biliary: No evidence of biliary obstruction or stone.  Pancreas: Diffuse atrophy. 2 cysts within the pancreatic tail, the larger measuring 17 mm. Although likely neoplastic (side branch intraductal papillary mucinous neoplasm) the larger cyst has grown minimally since 2010 abdominal MRI.  Spleen: Unremarkable.  Adrenals: Bilateral adrenal nodules, the largest in the left gland measuring 24 mm.  These are low-density on previous imaging, compatible with adenoma.  Kidneys and ureters: Bilateral  renal cortical thinning/lobulation. No hydronephrosis or urolithiasis. 14 mm right renal cyst.  Bladder: Circumferential thickening and perivesicular fat infiltration. The bladder is decompressed around a Foley catheter.  Reproductive: Fiducial markers in the prostate. Pelvic radiation could account for some of the bladder findings and haziness of the pelvic fat.  Bowel: No obstruction. No pericecal inflammation.  Retroperitoneum: No mass or adenopathy.  Peritoneum: No ascites or pneumoperitoneum.  Vascular: No acute abnormality.  OSSEOUS: No acute abnormalities. Lumbar spondylosis which is advanced at L5-S1.  IMPRESSION: 1. Bladder wall thickening which could be  chronic or acute/infectious. Correlate with urinalysis. 2. Incidental findings are noted above.   Electronically Signed   By: Jorje Guild M.D.   On: 08/28/2013 04:44     PERTINENT LAB RESULTS: CBC:  Recent Labs  08/27/13 0314 08/28/13 0518  WBC 10.0 4.8  HGB 12.2* 10.6*  HCT 37.9* 33.2*  PLT 202 165   CMET CMP     Component Value Date/Time   NA 146 08/28/2013 0518   K 3.6* 08/28/2013 0518   CL 108 08/28/2013 0518   CO2 27 08/28/2013 0518   GLUCOSE 121* 08/28/2013 0518   BUN 20 08/28/2013 0518   CREATININE 1.21 08/28/2013 0518   CALCIUM 9.1 08/28/2013 0518   PROT 5.9* 08/28/2013 0518   ALBUMIN 3.1* 08/28/2013 0518   AST 13 08/28/2013 0518   ALT 7 08/28/2013 0518   ALKPHOS 47 08/28/2013 0518   BILITOT 0.3 08/28/2013 0518   GFRNONAA 52* 08/28/2013 0518   GFRAA 60* 08/28/2013 0518    GFR Estimated Creatinine Clearance: 40.2 ml/min (by C-G formula based on Cr of 1.21).  Recent Labs  08/27/13 0314  LIPASE 26   Coags:  Recent Labs  08/27/13 0702 08/28/13 0518  INR 1.62* 2.01*   Microbiology: Recent Results (from the past 240 hour(s))  URINE CULTURE     Status: None   Collection Time    08/27/13  3:42 AM      Result Value Ref Range Status   Specimen Description URINE, CATHETERIZED   Final   Special Requests NONE    Final   Culture  Setup Time     Final   Value: 08/27/2013 12:19     Performed at [facility] PENDING   Incomplete   Culture     Final   Value: Culture reincubated for better growth     Performed at [facility]   Report Status PENDING   Incomplete  CULTURE, BLOOD (ROUTINE X 2)     Status: None   Collection Time    08/27/13  6:00 AM      Result Value Ref Range Status   Specimen Description BLOOD LEFT ARM   Final   Special Requests BOTTLES DRAWN AEROBIC AND ANAEROBIC 10CC EACH   Final   Culture  Setup Time     Final   Value: 08/27/2013 12:23     Performed at [facility]   Culture     Final   Value:        BLOOD CULTURE RECEIVED NO GROWTH TO DATE CULTURE WILL BE HELD FOR 5 DAYS BEFORE ISSUING A FINAL NEGATIVE REPORT     Performed at [facility]   Report Status PENDING   Incomplete  CULTURE, BLOOD (ROUTINE X 2)     Status: None   Collection Time    08/27/13  6:08 AM      Result Value Ref Range Status   Specimen Description BLOOD LEFT HAND   Final   Special Requests BOTTLES DRAWN AEROBIC ONLY 6CC   Final   Culture  Setup Time     Final   Value: 08/27/2013 12:23     Performed at [facility]   Culture     Final   Value:        BLOOD CULTURE RECEIVED NO GROWTH TO DATE CULTURE WILL BE HELD FOR 5 DAYS BEFORE ISSUING A FINAL NEGATIVE REPORT     Performed at [facility]   Report Status  PENDING   Incomplete     BRIEF HOSPITAL COURSE:   Principal Problem: Abdominal pain secondary to urinary retention -history of prostate cancer s/p prostatectomy with chronic indwelling foley catheter -unclear reason for urinary retention despite foley, probably malfunctioned and was kinked. -foley catheter was replaced, no further abdominal pain. CT Abdomen confirmed bladder wall thickening -was comfortable and no had issues urinating with foley on day of discharge  UTI (lower urinary tract infection) -Had abnormal UA which suggested  UTI or chronic colonization, but afebrile and no other symptoms. Admitted and started on Cefepime, spouse requesting discharge, since afebrile and doing so well, will discharge on Ciprofloxacin. Not sure whether this is UTI or a simple colonization -Spouse is aware that urine cultures will need to be followed up with PCP in 1-2 days, she is agreeable.  Active Problems: Dementia -Had aggressive behavior towards nurses at times and anxiety while admitted.  Also refused medications at times. -Xanax and Haldol was given PRN -May have had superimposed encephalopathy due to his infection. -Aggressive behavior resolved.  Patient calm and cooperative at the time of discharge    PAF (paroxysmal atrial fibrillation) -Held any rate limiting drugs while admitted due to patient having bradycardia and syncope with beta blockers in the past -IV fluids were given to help control heart rate -coumadin continued.  History of LE DVT in Sept 2014  -INR was sub-therapeutic -Pharmacy was consulted to dose warfarin -There was no clear indication for bridging as clot was diagnosed more than 11 months ago -Will request INR check with in 2-3 days of discharge.    Lumbar stenosis -was unable to ambulate.  Full fall precautions were taken -PT/OT were consulted.  Recommended Home health OT.  Recmomended hoyer lift.  Patient requires 24 hours supervision/assistance   TODAY-DAY OF DISCHARGE:  Subjective:   Larone Kliethermes today has no headache, no chest or abdominal pain, no new weakness, tingling, or numbness, feels much better and wants to go home today. States that he no complaints today and his abdominal pain has resolved.  He has been tolerating his food well, urinating well with foley in place, and had a BM yesterday which was normal for him.  Still a little confused but wife at bedside says that this his baseline.     Objective:   Blood pressure 124/87, pulse 85, temperature 98 F (36.7 C), temperature source  Oral, resp. rate 20, height 5' 7.5" (1.715 m), weight 74.299 kg (163 lb 12.8 oz), SpO2 98.00%.  Intake/Output Summary (Last 24 hours) at 08/28/13 1150 Last data filed at 08/28/13 0558  Gross per 24 hour  Intake    100 ml  Output    950 ml  Net   -850 ml   Filed Weights   08/27/13 0911  Weight: 74.299 kg (163 lb 12.8 oz)    Exam Genera: Awake Alert, Normal affect, smiling, and working with OT Head/Eyes: [place].AT, PERRL, EOM's intact Neck: Supple, No JVD, No cervical lymphadenopathy appriciated.  Chest: Symmetrical Chest wall movement, Good air movement bilaterally, CTAB Heart: RRR, No Gallops or rubs, systolic murmur noted, No Parasternal Heave Abd: +ve B.Sounds, Abd Soft, Non tender, No organomegaly appriciated, No rebound -guarding or rigidity.  Foley catheter in place Ext: No Cyanosis, Clubbing or edema, No new Rash or bruise Neurologic: Able to move all extremities, muscle strengths and sensation intact.  DISCHARGE CONDITION: Stable  DISPOSITION: Home with home health services   DISCHARGE INSTRUCTIONS:  Activity:  As tolerated with Full fall precautions  use walker/cane & assistance as needed  Diet recommendation: Dysphagia 3 diet Discharge Instructions   Call MD for:  persistant nausea and vomiting    Complete by:  As directed      Call MD for:  redness, tenderness, or signs of infection (pain, swelling, redness, odor or green/yellow discharge around incision site)    Complete by:  As directed      Call MD for:  temperature >100.4    Complete by:  As directed      Diet - low sodium heart healthy    Complete by:  As directed      Increase activity slowly    Complete by:  As directed           Follow-up Information   Follow up In 1 week. (Urology follow up with regular Urologist or [facility] [phone])       Follow up with Kandice Hams, MD In 2 days. (INR check and Hospital Follow up. Appointment with Dr. Delfina Redwood is on )    Specialty:  Internal Medicine    Contact information:   [address] [place] [address] [phone]      Total Time spent on discharge equals 45 minutes.  Signed: Janifer Adie, PA-S, [place], [place]  [phone] 08/28/2013 11:50 AM  **Disclaimer: This note may have been dictated with voice recognition software. Similar sounding words can inadvertently be transcribed and this note may contain transcription errors which may not have been corrected upon publication of note.**  Attending Patient was seen, examined,treatment plan was discussed with the Physician extender. I have directly reviewed the clinical findings, lab, imaging studies and management of this patient in detail. I have made the necessary changes to the above noted documentation, and agree with the documentation, as recorded by the Physician extender.  Nena Alexander MD [facility].

## 2013-08-28 NOTE — Progress Notes (Signed)
Pt aruosable but still not alert enough to take po meds. Pt was given pre-procedural medications of solumedrol and benadryl IV.

## 2013-08-28 NOTE — Discharge Instructions (Signed)
Please follow up with Urology with in the week for UTI and blocked foley cath.  Please have INR checked in 2-3 days (Coumadin level).  It was low on admission and cipro changes it quickly.

## 2013-08-28 NOTE — Care Management Note (Signed)
    Page 1 of 2   08/28/2013     2:46:22 PM CARE MANAGEMENT NOTE 08/28/2013  Patient:  JARMAINE, EHRLER   Account Number:  [id number]  Date Initiated:  08/28/2013  Documentation initiated by:  Tomi Bamberger  Subjective/Objective Assessment:   dx uti, foley malfunction  admit- lives with spouse.     Action/Plan:   pt eval- rec hhpt, hoyer lift.   Anticipated DC Date:  08/28/2013   Anticipated DC Plan:  [place]  CM consult      [facility] Choice  Resumption Of Svcs/PTA Provider   Choice offered to / List presented to:  C-1 Patient   DME arranged  OTHER - SEE COMMENT      DME agency  [place] arranged  HH-1 RN  [place].   Status of service:  Completed, signed off Medicare Important Message given?  NA - LOS <3 / Initial given by admissions (If response is "NO", the following Medicare IM given date fields will be blank) Date Medicare IM given:   Medicare IM given by:   Date Additional Medicare IM given:   Additional Medicare IM given by:    Discharge Disposition:  [place]  Per UR Regulation:  Reviewed for med. necessity/level of care/duration of stay  If discussed at [place] of Stay Meetings, dates discussed:    Comments:  08/28/13 [place], BSN [phone] patient is active with [facility] for [facility], would like to add HHPT,  [place] notified.  Soc will begin 24-48 hrs post dc.

## 2013-08-28 NOTE — Evaluation (Signed)
Physical Therapy Evaluation Patient Details Name: Joshua Rodgers MRN: [id number] DOB: October 11, 1923 Today's Date: 08/28/2013   History of Present Illness  80 y.o. male presents with lower abdomen pain and inability void bladder. PMH: Dementia, bed bound for 2 years, transfer to w/c stand pivot only, indwelling foley for 1 year, Afib, CA prostate   Clinical Impression  Patient presents with functional limitations due to deficits listed in PT problem list (see below). PTA, pt uses power chair for all mobility and assist with transfers at baseline. Education provided to family on techniques to assist with transfers at home and recommendation for positioning in bed. Hoyer lift recommended for home to ease burden of care on wife/family. Pt may need hospital bed in the future however wife declining at this time. Family wants to take patient home. Pt would benefit from acute PT and follow up HHPT to improve strength, transfers and ease burden of care for family at discharge.     Follow Up Recommendations Home health PT;Supervision/Assistance - 24 hour    Equipment Recommendations   (Hoyer lift.)    Recommendations for Other Services       Precautions / Restrictions Precautions Precautions: Fall Restrictions Weight Bearing Restrictions: No      Mobility  Bed Mobility Overal bed mobility: Needs Assistance Bed Mobility: Rolling;Sidelying to Sit Rolling: Min assist (use of rails and HOB elevated.) Sidelying to sit: Max assist;HOB elevated (with rails.)       General bed mobility comments: Pt required step by step v/c to sequence task with tactile cues for mobility. Pt with posterior lean upon sitting EOB with Mod A for support progressing to Min guard assist with UE support.  Transfers Overall transfer level: Needs assistance   Transfers: Squat Pivot Transfers     Squat pivot transfers: Max assist;+2 physical assistance     General transfer comment: Performed squat pivot transfer to  power chair with max A. Pt able to scoot bottom along side bed with min A. Instructed family in how to perform without slideboard. Increased time and cues for sequencing. Difficulty with anterior trunk lean, anxious?  Ambulation/Gait                Stairs            Wheelchair Mobility    Modified Rankin (Stroke Patients Only)       Balance Overall balance assessment: Needs assistance Sitting-balance support: Bilateral upper extremity supported Sitting balance-Leahy Scale: Poor Sitting balance - Comments: Pt requires UE support to maintain balance EOB. Initially with severe posterior lean and pushing progressing to min guard after cues for anterior trunk lean. Postural control: Posterior lean                                   Pertinent Vitals/Pain Pain Assessment: No/denies pain    Home Living Family/patient expects to be discharged to:: Private residence Living Arrangements: Spouse/significant other Available Help at Discharge: Family;Available 24 hours/day Type of Home: House Home Access: Ramped entrance     Home Layout: One level Home Equipment: Wheelchair - power;Bedside commode;Other (comment)      Prior Function Level of Independence: Needs assistance   Gait / Transfers Assistance Needed: Pt is non ambulatory. Performs squat pivot and slide board transfers to power chair daily with assist from aide/wife/son-in-law. Has an aide come in mornings to assist.  ADL's / Homemaking Assistance Needed: total (A)  Hand Dominance   Dominant Hand: Right    Extremity/Trunk Assessment   Upper Extremity Assessment: Overall WFL for tasks assessed           Lower Extremity Assessment: RLE deficits/detail;LLE deficits/detail RLE Deficits / Details: Limited AROM hip flexion, lacking full knee extension, no ankle DF/PF. B feet in internal rotation crossing each other at rest. Generalized weakness throughout, grossly hip flexion ~2/5, knee  extension 2+/5, 0/5 Df/PF LLE Deficits / Details: Limited AROM hip flexion, lacking full knee extension, no ankle DF/PF. B feet in internal rotation crossing each other at rest. Generalized weakness throughout, grossly hip flexion ~2/5, knee extension 2+/5, 0/5 Df/PF     Communication   Communication: No difficulties  Cognition Arousal/Alertness: Awake/alert Behavior During Therapy: WFL for tasks assessed/performed Overall Cognitive Status: History of cognitive impairments - at baseline                      General Comments General comments (skin integrity, edema, etc.): Heel wound on right foot medial side covered by bandage.    Exercises General Exercises - Lower Extremity Long Arc Quad: Both;5 reps;Seated Hip Flexion/Marching: Both;5 reps;Seated      Assessment/Plan    PT Assessment Patient needs continued PT services  PT Diagnosis Generalized weakness   PT Problem List Decreased strength;Decreased range of motion;Decreased cognition;Impaired sensation;Impaired tone;Decreased activity tolerance;Decreased balance;Decreased safety awareness;Decreased skin integrity;Decreased knowledge of precautions;Decreased mobility  PT Treatment Interventions Balance training;Gait training;Neuromuscular re-education;Cognitive remediation;Patient/family education;Therapeutic activities;Therapeutic exercise;Wheelchair mobility training;Functional mobility training   PT Goals (Current goals can be found in the Care Plan section) Acute Rehab PT Goals Patient Stated Goal: to go home PT Goal Formulation: With patient/family Time For Goal Achievement: 09/11/13 Potential to Achieve Goals: Fair    Frequency Min 3X/week   Barriers to discharge        Co-evaluation               End of Session Equipment Utilized During Treatment: Gait belt Activity Tolerance: Patient limited by fatigue Patient left: in chair;with call bell/phone within reach;with family/visitor present Nurse  Communication: Mobility status;Need for lift equipment         Time: 1320-1355 PT Time Calculation (min): 35 min   Charges:   PT Evaluation $Initial PT Evaluation Tier I: 1 Procedure PT Treatments $Therapeutic Activity: 8-22 mins   PT G CodesCandy Sledge A 08/28/2013, 2:16 PM Candy Sledge, [place], DPT [phone]

## 2013-08-30 LAB — URINE CULTURE: Colony Count: >=100000

## 2013-09-02 LAB — CULTURE, BLOOD (ROUTINE X 2)
Culture: NO GROWTH
Culture: NO GROWTH

## 2013-11-21 ENCOUNTER — Other Ambulatory Visit: Payer: 59

## 2013-11-27 ENCOUNTER — Ambulatory Visit (INDEPENDENT_AMBULATORY_CARE_PROVIDER_SITE_OTHER): Payer: 59

## 2013-11-27 DIAGNOSIS — R55 Syncope and collapse: Secondary | ICD-10-CM

## 2013-11-27 NOTE — Procedures (Signed)
    History:  Joshua Rodgers is an 80 year old gentleman with a history of dementia. The patient is on Aricept that was stopped secondary to bradycardia. He has had an episode of syncope and he is being evaluated for the this issue.  This is a routine EEG. No skull defects are noted. Medications include Flomax, iron supplementation, potassium, vitamin B12, calcium supplementation, Celexa, amiloride, Coumadin, alprazolam, Protonix, and Namenda.   EEG classification: Normal awake  Description of the recording: The background rhythms of this recording consists of a fairly well modulated medium amplitude alpha rhythm of 8 Hz that is reactive to eye opening and closure. As the record progresses, the patient appears to remain in the waking state throughout the recording. Photic stimulation was performed, resulting in a bilateral and symmetric photic driving response. Hyperventilation was not performed. At no time during the recording does there appear to be evidence of spike or spike wave discharges or evidence of focal slowing. EKG monitor shows no evidence of cardiac rhythm abnormalities with a heart rate of 102.  Impression: This is a normal EEG recording in the waking state. No evidence of ictal or interictal discharges are seen.

## 2013-11-30 ENCOUNTER — Ambulatory Visit: Payer: Medicare PPO | Admitting: Cardiology

## 2013-12-01 ENCOUNTER — Ambulatory Visit: Payer: Medicare PPO | Admitting: Cardiology

## 2014-01-03 ENCOUNTER — Telehealth: Payer: Self-pay | Admitting: Neurology

## 2014-01-03 NOTE — Telephone Encounter (Signed)
Pt's wife is calling stating patient needs a Rx for a brace for his legs.  She had one but she lost it.  Please call and advise.

## 2014-01-09 NOTE — Telephone Encounter (Signed)
Joshua Rodgers, Please call patient or family, find out what kind of ankle brace he needs, I will write Rx for him

## 2014-01-09 NOTE — Telephone Encounter (Signed)
Called patient left message Dr.Yan was out the office Just returning 01-09-2014. Will forward message to Dr.Yan.

## 2014-01-09 NOTE — Telephone Encounter (Signed)
Called and spoke to patient wife she states that both of his feet are coming inwards and his toes are touching. Patient's wife is  using a pillow that's working some it use to be one foot going inwards but now its both. I need something that's going to keep his feet apart. I don't know what we need can  Dr.Yan help [place] ?

## 2014-01-10 NOTE — Telephone Encounter (Signed)
I have called Ms. Joshua Rodgers's, patient is bedridden, not ambulatory anymore, he has bilateral ankle plantar flexion, I have suggested her to go to [facility] for ankle braces.

## 2014-01-16 NOTE — Telephone Encounter (Signed)
Rx for ankle brace ready, please fax.

## 2014-01-16 NOTE — Telephone Encounter (Signed)
Patient's spouse stated [facility] needs order for ankle brace.  Patient has an appointment scheduled on 01/19/14.  Please call and advise.

## 2014-01-16 NOTE — Telephone Encounter (Signed)
Dr.Yan patient has apt. With [employer] [facility] On 01-19-2014 Patient needs RX for braces faxed to [facility] [facility] Before then.

## 2014-01-17 NOTE — Telephone Encounter (Signed)
Called [employer] and spoke to [place] and his RX for ankle braces has been to [phone]

## 2014-02-11 ENCOUNTER — Inpatient Hospital Stay (HOSPITAL_COMMUNITY)
Admission: EM | Admit: 2014-02-11 | Discharge: 2014-02-15 | DRG: 698 | Disposition: A | Discharge status: Home Health | Payer: Medicare PPO | Attending: Internal Medicine | Admitting: Internal Medicine

## 2014-02-11 ENCOUNTER — Emergency Department (HOSPITAL_COMMUNITY): Payer: Medicare PPO

## 2014-02-11 ENCOUNTER — Encounter (HOSPITAL_COMMUNITY): Payer: Self-pay | Admitting: Emergency Medicine

## 2014-02-11 DIAGNOSIS — I480 Paroxysmal atrial fibrillation: Secondary | ICD-10-CM | POA: Diagnosis present

## 2014-02-11 DIAGNOSIS — F329 Major depressive disorder, single episode, unspecified: Secondary | ICD-10-CM | POA: Diagnosis present

## 2014-02-11 DIAGNOSIS — M1990 Unspecified osteoarthritis, unspecified site: Secondary | ICD-10-CM | POA: Diagnosis present

## 2014-02-11 DIAGNOSIS — I482 Chronic atrial fibrillation, unspecified: Secondary | ICD-10-CM | POA: Insufficient documentation

## 2014-02-11 DIAGNOSIS — Z86718 Personal history of other venous thrombosis and embolism: Secondary | ICD-10-CM

## 2014-02-11 DIAGNOSIS — Z993 Dependence on wheelchair: Secondary | ICD-10-CM | POA: Diagnosis not present

## 2014-02-11 DIAGNOSIS — Z87891 Personal history of nicotine dependence: Secondary | ICD-10-CM

## 2014-02-11 DIAGNOSIS — B9689 Other specified bacterial agents as the cause of diseases classified elsewhere: Secondary | ICD-10-CM | POA: Diagnosis present

## 2014-02-11 DIAGNOSIS — I10 Essential (primary) hypertension: Secondary | ICD-10-CM | POA: Diagnosis present

## 2014-02-11 DIAGNOSIS — M4806 Spinal stenosis, lumbar region: Secondary | ICD-10-CM | POA: Diagnosis present

## 2014-02-11 DIAGNOSIS — Z7401 Bed confinement status: Secondary | ICD-10-CM

## 2014-02-11 DIAGNOSIS — E872 Acidosis: Secondary | ICD-10-CM | POA: Diagnosis present

## 2014-02-11 DIAGNOSIS — G929 Unspecified toxic encephalopathy: Secondary | ICD-10-CM | POA: Diagnosis present

## 2014-02-11 DIAGNOSIS — R6520 Severe sepsis without septic shock: Secondary | ICD-10-CM | POA: Diagnosis present

## 2014-02-11 DIAGNOSIS — N400 Enlarged prostate without lower urinary tract symptoms: Secondary | ICD-10-CM | POA: Diagnosis present

## 2014-02-11 DIAGNOSIS — M21371 Foot drop, right foot: Secondary | ICD-10-CM | POA: Diagnosis present

## 2014-02-11 DIAGNOSIS — I4891 Unspecified atrial fibrillation: Secondary | ICD-10-CM

## 2014-02-11 DIAGNOSIS — M21372 Foot drop, left foot: Secondary | ICD-10-CM | POA: Diagnosis present

## 2014-02-11 DIAGNOSIS — R109 Unspecified abdominal pain: Secondary | ICD-10-CM | POA: Diagnosis not present

## 2014-02-11 DIAGNOSIS — R338 Other retention of urine: Secondary | ICD-10-CM | POA: Diagnosis present

## 2014-02-11 DIAGNOSIS — A419 Sepsis, unspecified organism: Secondary | ICD-10-CM | POA: Diagnosis present

## 2014-02-11 DIAGNOSIS — T8351XA Infection and inflammatory reaction due to indwelling urinary catheter, initial encounter: Secondary | ICD-10-CM | POA: Diagnosis present

## 2014-02-11 DIAGNOSIS — G92 Toxic encephalopathy: Secondary | ICD-10-CM | POA: Diagnosis present

## 2014-02-11 DIAGNOSIS — R197 Diarrhea, unspecified: Secondary | ICD-10-CM | POA: Diagnosis present

## 2014-02-11 DIAGNOSIS — D649 Anemia, unspecified: Secondary | ICD-10-CM | POA: Diagnosis present

## 2014-02-11 DIAGNOSIS — R7989 Other specified abnormal findings of blood chemistry: Secondary | ICD-10-CM | POA: Diagnosis present

## 2014-02-11 DIAGNOSIS — I248 Other forms of acute ischemic heart disease: Secondary | ICD-10-CM | POA: Diagnosis present

## 2014-02-11 DIAGNOSIS — E876 Hypokalemia: Secondary | ICD-10-CM | POA: Diagnosis not present

## 2014-02-11 DIAGNOSIS — Z96652 Presence of left artificial knee joint: Secondary | ICD-10-CM | POA: Diagnosis present

## 2014-02-11 DIAGNOSIS — A4151 Sepsis due to Escherichia coli [E. coli]: Secondary | ICD-10-CM | POA: Diagnosis present

## 2014-02-11 DIAGNOSIS — I5032 Chronic diastolic (congestive) heart failure: Secondary | ICD-10-CM | POA: Diagnosis present

## 2014-02-11 DIAGNOSIS — F0390 Unspecified dementia without behavioral disturbance: Secondary | ICD-10-CM | POA: Diagnosis present

## 2014-02-11 DIAGNOSIS — N312 Flaccid neuropathic bladder, not elsewhere classified: Secondary | ICD-10-CM | POA: Diagnosis present

## 2014-02-11 DIAGNOSIS — K219 Gastro-esophageal reflux disease without esophagitis: Secondary | ICD-10-CM | POA: Diagnosis present

## 2014-02-11 DIAGNOSIS — I82409 Acute embolism and thrombosis of unspecified deep veins of unspecified lower extremity: Secondary | ICD-10-CM | POA: Insufficient documentation

## 2014-02-11 DIAGNOSIS — E559 Vitamin D deficiency, unspecified: Secondary | ICD-10-CM | POA: Diagnosis present

## 2014-02-11 DIAGNOSIS — N390 Urinary tract infection, site not specified: Secondary | ICD-10-CM | POA: Diagnosis present

## 2014-02-11 DIAGNOSIS — Z7901 Long term (current) use of anticoagulants: Secondary | ICD-10-CM

## 2014-02-11 DIAGNOSIS — Z8546 Personal history of malignant neoplasm of prostate: Secondary | ICD-10-CM | POA: Diagnosis not present

## 2014-02-11 DIAGNOSIS — Z960 Presence of urogenital implants: Secondary | ICD-10-CM

## 2014-02-11 DIAGNOSIS — Z978 Presence of other specified devices: Secondary | ICD-10-CM | POA: Insufficient documentation

## 2014-02-11 DIAGNOSIS — E785 Hyperlipidemia, unspecified: Secondary | ICD-10-CM | POA: Diagnosis present

## 2014-02-11 DIAGNOSIS — R778 Other specified abnormalities of plasma proteins: Secondary | ICD-10-CM | POA: Diagnosis present

## 2014-02-11 DIAGNOSIS — K5900 Constipation, unspecified: Secondary | ICD-10-CM | POA: Diagnosis present

## 2014-02-11 DIAGNOSIS — O10019 Pre-existing essential hypertension complicating pregnancy, unspecified trimester: Secondary | ICD-10-CM | POA: Insufficient documentation

## 2014-02-11 DIAGNOSIS — M48061 Spinal stenosis, lumbar region without neurogenic claudication: Secondary | ICD-10-CM | POA: Diagnosis present

## 2014-02-11 LAB — CBC WITH DIFFERENTIAL/PLATELET
Basophils Absolute: 0 10*3/uL (ref 0.0–0.1)
Basophils Relative: 0 % (ref 0–1)
EOS ABS: 0 10*3/uL (ref 0.0–0.7)
Eosinophils Relative: 0 % (ref 0–5)
HEMATOCRIT: 36.8 % — AB (ref 39.0–52.0)
Hemoglobin: 12 g/dL — ABNORMAL LOW (ref 13.0–17.0)
LYMPHS PCT: 5 % — AB (ref 12–46)
Lymphs Abs: 0.5 10*3/uL — ABNORMAL LOW (ref 0.7–4.0)
MCH: 29.9 pg (ref 26.0–34.0)
MCHC: 32.6 g/dL (ref 30.0–36.0)
MCV: 91.5 fL (ref 78.0–100.0)
MONO ABS: 0.7 10*3/uL (ref 0.1–1.0)
Monocytes Relative: 7 % (ref 3–12)
Neutro Abs: 8.4 10*3/uL — ABNORMAL HIGH (ref 1.7–7.7)
Neutrophils Relative %: 88 % — ABNORMAL HIGH (ref 43–77)
PLATELETS: 208 10*3/uL (ref 150–400)
RBC: 4.02 MIL/uL — ABNORMAL LOW (ref 4.22–5.81)
RDW: 15.2 % (ref 11.5–15.5)
WBC: 9.7 10*3/uL (ref 4.0–10.5)

## 2014-02-11 LAB — I-STAT TROPONIN, ED: Troponin i, poc: 0.12 ng/mL (ref 0.00–0.08)

## 2014-02-11 LAB — URINE MICROSCOPIC-ADD ON

## 2014-02-11 LAB — TROPONIN I
Troponin I: 0.16 ng/mL — ABNORMAL HIGH (ref <0.031)
Troponin I: 0.24 ng/mL — ABNORMAL HIGH (ref <0.031)
Troponin I: 0.17 ng/mL — ABNORMAL HIGH (ref <0.031)

## 2014-02-11 LAB — COMPREHENSIVE METABOLIC PANEL
ALK PHOS: 51 U/L (ref 39–117)
ALT: 9 U/L (ref 0–53)
AST: 24 U/L (ref 0–37)
Albumin: 3.7 g/dL (ref 3.5–5.2)
Anion gap: 13 (ref 5–15)
BILIRUBIN TOTAL: 0.6 mg/dL (ref 0.3–1.2)
BUN: 20 mg/dL (ref 6–23)
CALCIUM: 9.5 mg/dL (ref 8.4–10.5)
CO2: 21 mmol/L (ref 19–32)
CREATININE: 1.29 mg/dL (ref 0.50–1.35)
Chloride: 109 mmol/L (ref 96–112)
GFR calc non Af Amer: 48 mL/min — ABNORMAL LOW (ref >90)
GFR, EST AFRICAN AMERICAN: 55 mL/min — AB (ref >90)
Glucose, Bld: 98 mg/dL (ref 70–99)
Potassium: 4.4 mmol/L (ref 3.5–5.1)
Sodium: 143 mmol/L (ref 135–145)
Total Protein: 6.4 g/dL (ref 6.0–8.3)

## 2014-02-11 LAB — URINALYSIS, ROUTINE W REFLEX MICROSCOPIC
BILIRUBIN URINE: NEGATIVE
Glucose, UA: NEGATIVE mg/dL
Ketones, ur: NEGATIVE mg/dL
NITRITE: NEGATIVE
PH: 6.5 (ref 5.0–8.0)
Protein, ur: 30 mg/dL — AB
SPECIFIC GRAVITY, URINE: 1.015 (ref 1.005–1.030)
Urobilinogen, UA: 0.2 mg/dL (ref 0.0–1.0)

## 2014-02-11 LAB — APTT: APTT: 34 s (ref 24–37)

## 2014-02-11 LAB — I-STAT VENOUS BLOOD GAS, ED
Acid-base deficit: 6 mmol/L — ABNORMAL HIGH (ref 0.0–2.0)
Bicarbonate: 18.8 mEq/L — ABNORMAL LOW (ref 20.0–24.0)
O2 Saturation: 84 %
PCO2 VEN: 33.6 mmHg — AB (ref 45.0–50.0)
PO2 VEN: 50 mmHg — AB (ref 30.0–45.0)
TCO2: 20 mmol/L (ref 0–100)
pH, Ven: 7.357 — ABNORMAL HIGH (ref 7.250–7.300)

## 2014-02-11 LAB — I-STAT CG4 LACTIC ACID, ED
LACTIC ACID, VENOUS: 3.37 mmol/L — AB (ref 0.5–2.0)
Lactic Acid, Venous: 4.91 mmol/L (ref 0.5–2.0)

## 2014-02-11 LAB — PROCALCITONIN: Procalcitonin: 0.13 ng/mL

## 2014-02-11 LAB — LACTIC ACID, PLASMA: Lactic Acid, Venous: 2.6 mmol/L (ref 0.5–2.0)

## 2014-02-11 LAB — PROTIME-INR
INR: 1.81 — AB (ref 0.00–1.49)
PROTHROMBIN TIME: 21.1 s — AB (ref 11.6–15.2)

## 2014-02-11 LAB — MRSA PCR SCREENING: MRSA by PCR: NEGATIVE

## 2014-02-11 MED ORDER — ONDANSETRON HCL 4 MG PO TABS: 4.0000 mg | ORAL_TABLET | Freq: Four times a day (QID) | ORAL | Status: DC | PRN

## 2014-02-11 MED ORDER — WARFARIN SODIUM 2.5 MG PO TABS
2.5000 mg | ORAL_TABLET | Freq: Once | ORAL | Status: AC
  Administered 2014-02-11: 2.5 mg via ORAL
  Filled 2014-02-11: qty 1

## 2014-02-11 MED ORDER — ALPRAZOLAM 0.5 MG PO TABS: 0.5000 mg | ORAL_TABLET | Freq: Three times a day (TID) | ORAL | Status: DC | PRN

## 2014-02-11 MED ORDER — SODIUM CHLORIDE 0.9 % IV SOLN
INTRAVENOUS | Status: DC
  Administered 2014-02-11 – 2014-02-14 (×5): via INTRAVENOUS

## 2014-02-11 MED ORDER — DEXTROSE 5 % IV SOLN
2.0000 g | INTRAVENOUS | Status: DC
  Administered 2014-02-11 – 2014-02-12 (×2): 2 g via INTRAVENOUS
  Filled 2014-02-11 (×2): qty 2

## 2014-02-11 MED ORDER — HEPARIN SODIUM (PORCINE) 5000 UNIT/ML IJ SOLN: 5000.0000 [IU] | Freq: Three times a day (TID) | INTRAMUSCULAR | Status: DC

## 2014-02-11 MED ORDER — SODIUM CHLORIDE 0.9 % IV BOLUS (SEPSIS)
1000.0000 mL | Freq: Once | INTRAVENOUS | Status: AC
  Administered 2014-02-11: 1000 mL via INTRAVENOUS

## 2014-02-11 MED ORDER — FERROUS SULFATE 325 (65 FE) MG PO TABS
325.0000 mg | ORAL_TABLET | Freq: Every day | ORAL | Status: DC
  Administered 2014-02-12 – 2014-02-15 (×4): 325 mg via ORAL
  Filled 2014-02-11 (×4): qty 1

## 2014-02-11 MED ORDER — PANTOPRAZOLE SODIUM 40 MG PO TBEC
40.0000 mg | DELAYED_RELEASE_TABLET | Freq: Every day | ORAL | Status: DC
  Administered 2014-02-12 – 2014-02-15 (×4): 40 mg via ORAL
  Filled 2014-02-11 (×4): qty 1

## 2014-02-11 MED ORDER — ACETAMINOPHEN 650 MG RE SUPP: 650.0000 mg | Freq: Four times a day (QID) | RECTAL | Status: DC | PRN

## 2014-02-11 MED ORDER — ONDANSETRON HCL 4 MG/2ML IJ SOLN: 4.0000 mg | Freq: Four times a day (QID) | INTRAMUSCULAR | Status: DC | PRN

## 2014-02-11 MED ORDER — DILTIAZEM HCL 100 MG IV SOLR
5.0000 mg/h | INTRAVENOUS | Status: DC
  Administered 2014-02-11: 5 mg/h via INTRAVENOUS

## 2014-02-11 MED ORDER — GUAIFENESIN-DM 100-10 MG/5ML PO SYRP: 5.0000 mL | ORAL_SOLUTION | ORAL | Status: DC | PRN

## 2014-02-11 MED ORDER — CITALOPRAM HYDROBROMIDE 20 MG PO TABS
20.0000 mg | ORAL_TABLET | Freq: Every day | ORAL | Status: DC
  Administered 2014-02-12 – 2014-02-15 (×4): 20 mg via ORAL
  Filled 2014-02-11 (×4): qty 1

## 2014-02-11 MED ORDER — TAMSULOSIN HCL 0.4 MG PO CAPS
0.8000 mg | ORAL_CAPSULE | Freq: Every day | ORAL | Status: DC
  Administered 2014-02-12 – 2014-02-14 (×3): 0.8 mg via ORAL
  Filled 2014-02-11 (×4): qty 2

## 2014-02-11 MED ORDER — SODIUM CHLORIDE 0.9 % IJ SOLN
3.0000 mL | Freq: Two times a day (BID) | INTRAMUSCULAR | Status: DC
  Administered 2014-02-11 – 2014-02-15 (×7): 3 mL via INTRAVENOUS

## 2014-02-11 MED ORDER — ALBUTEROL SULFATE (2.5 MG/3ML) 0.083% IN NEBU: 2.5000 mg | INHALATION_SOLUTION | RESPIRATORY_TRACT | Status: DC | PRN

## 2014-02-11 MED ORDER — PIPERACILLIN-TAZOBACTAM 3.375 G IVPB
3.3750 g | Freq: Once | INTRAVENOUS | Status: AC
  Administered 2014-02-11: 3.375 g via INTRAVENOUS
  Filled 2014-02-11: qty 50

## 2014-02-11 MED ORDER — AMLODIPINE BESYLATE 5 MG PO TABS
5.0000 mg | ORAL_TABLET | Freq: Every day | ORAL | Status: DC
  Administered 2014-02-12 – 2014-02-13 (×2): 5 mg via ORAL
  Filled 2014-02-11 (×2): qty 1

## 2014-02-11 MED ORDER — SODIUM CHLORIDE 0.9 % IV BOLUS (SEPSIS)
500.0000 mL | INTRAVENOUS | Status: AC
  Administered 2014-02-11: 500 mL via INTRAVENOUS

## 2014-02-11 MED ORDER — VANCOMYCIN HCL IN DEXTROSE 1-5 GM/200ML-% IV SOLN
1000.0000 mg | Freq: Once | INTRAVENOUS | Status: AC
  Administered 2014-02-11: 1000 mg via INTRAVENOUS
  Filled 2014-02-11: qty 200

## 2014-02-11 MED ORDER — ACETAMINOPHEN 325 MG PO TABS: 650.0000 mg | ORAL_TABLET | Freq: Four times a day (QID) | ORAL | Status: DC | PRN

## 2014-02-11 MED ORDER — SODIUM CHLORIDE 0.9 % IV BOLUS (SEPSIS)
1000.0000 mL | INTRAVENOUS | Status: AC
  Administered 2014-02-11: 1000 mL via INTRAVENOUS

## 2014-02-11 MED ORDER — DEXTROSE 5 % IV SOLN: 2.0000 g | Freq: Once | INTRAVENOUS | Status: DC

## 2014-02-11 NOTE — Progress Notes (Signed)
Utilization review completed.  

## 2014-02-11 NOTE — ED Notes (Signed)
Ralene Bathe, MD aware of abnormal lab test results

## 2014-02-11 NOTE — ED Notes (Signed)
Pt from home via [facility] with c/o shaking and fidgeting overnight with fever and decreased urine output.  Pt reported lower mid abdominal pain and found to be a-fib with RVR HR 120s to 140s.  Given 1000 mg po liquid tylenol by EMS at around 930 am for temporal 99.9, rectal temp 20 mins later on arrival 99.9.  Given 50 cc NS.  Hx dementia, UTIs, pt in NAD, alert.

## 2014-02-11 NOTE — ED Provider Notes (Signed)
CSN: [id number]     Arrival date & time 02/11/14  1751 History   First MD Initiated Contact with Patient 02/11/14 [phone]     Chief Complaint  Patient presents with  . Code Sepsis  . Atrial Fibrillation     (Consider location/radiation/quality/duration/timing/severity/associated sxs/prior Treatment) HPI  Joshua Rodgers is a 81 y.o. male with PMH of paradoxical A. fib, DVT currently on Coumadin, CHF, recurrent UTIs, hypertension, dementia presenting with shortness of breath as well as "shaking and fidgeting overnight." Patient denies chest pain. Wife who is bedside and most of the history is obtained from stated he had a fever. Pt given 1000mg  of tylenol around 9:30 am for temp of 99.9. On arrival temperature 99.9 Fahrenheit. Patient denies cough, URI symptoms. No weight gain. Patient also with an indwelling Foley for 1 month. He has a history of recurrent urinary tract infections. The urine smells foul and looks mildly cloudy. Patient has also been having diffuse lower abdominal tenderness. He denies nausea, vomiting but has had 3 formed stools since last night. Wife denies blood. No melanotic stool. Patient with abdominal CT August 2015. Patient states the pain is similar.   PCP: Dr. Delfina Redwood   Past Medical History  Diagnosis Date  . Generalized weakness   . Hypokalemia   . Renal disorder   . Hyperlipidemia   . Vitamin D deficiency   . Bell's palsy   . Lumbar stenosis   . Hypertension   . Depression   . Dementia   . Prostate cancer ~ 2004    "had radiation tx" (11/24/2012)  . DVT (deep venous thrombosis) 11/24/2012    Acute DVT in the rightcommon femoral vein and acute DVT involving the leftcommon femorall,profinda popliteal and posterior tibial vein 2013. Pt seen by hematology in 2014. W/S revealed continued clot in left leg  . Asthma     "as a child, real bad" (11/24/2012)  . History of bleeding ulcers ~ 1956    "in hospital for ~ 1 month" (11/24/2012)  . Recurrent UTI (urinary  tract infection) 2013-now    "lots" (11/24/2012)  . DJD (degenerative joint disease)   . Arthritis     "joints" (11/24/2012)  . Anxiety   . Hyperaldosteronism   . CHF (congestive heart failure)     Diastolic dysfunction  . Vitamin B12 deficiency   . Spinal stenosis     Dr Christella Noa  . Dilated aortic root   . Mild aortic stenosis     echo 2012   Past Surgical History  Procedure Laterality Date  . Total knee arthroplasty Left 02/2010  . Back surgery  2008    "for stenosis" (11/24/2012)  . Esophagogastroduodenoscopy Left 02/16/2013    Procedure: ESOPHAGOGASTRODUODENOSCOPY (EGD);  Surgeon: Arta Silence, MD;  Location[facility] ENDOSCOPY;  Service: Endoscopy;  Laterality: Left;   Family History  Problem Relation Age of Onset  . Heart disease Mother   . Diabetes Father    History  Substance Use Topics  . Smoking status: Former Smoker -- 10 years    Types: Cigarettes  . Smokeless tobacco: Never Used     Comment: 11/24/2012 "quit smoking cigarettes in ~ 1956"  . Alcohol Use: Yes     Comment: occasoinal wine    Review of Systems 10 Systems reviewed and are negative for acute change except as noted in the HPI.    Allergies  Contrast media and Iohexol  Home Medications   Prior to Admission medications   Medication Sig Start Date End  Date Taking? Authorizing Provider  acetaminophen (TYLENOL) 500 MG tablet Take 1,000 mg by mouth every 6 (six) hours as needed for moderate pain, fever or headache.   Yes Historical Provider, MD  ALPRAZolam Duanne Moron) 0.5 MG tablet Take 0.5 mg by mouth 3 (three) times daily as needed for anxiety. For anxiety.   Yes Historical Provider, MD  aMILoride (MIDAMOR) 5 MG tablet Take 10 mg by mouth 2 (two) times daily.    Yes Historical Provider, MD  amLODipine (NORVASC) 10 MG tablet Take 10 mg by mouth daily.   Yes Historical Provider, MD  Calcium-Vitamin D (CALTRATE 600 PLUS-VIT D PO) Take 1 tablet by mouth daily.    Yes Historical Provider, MD  cholecalciferol  (VITAMIN D) 1000 UNITS tablet Take 1,000 Units by mouth daily.   Yes Historical Provider, MD  citalopram (CELEXA) 20 MG tablet Take 20 mg by mouth daily. 11/11/12  Yes Historical Provider, MD  Cyanocobalamin (VITAMIN B 12 PO) Take 1,000 mg by mouth daily.   Yes Historical Provider, MD  docusate sodium 100 MG CAPS Take 100 mg by mouth 2 (two) times daily. 08/28/13  Yes Marianne L York, PA-C  ferrous sulfate 324 (65 FE) MG TBEC Take 1 tablet by mouth daily.    Yes Historical Provider, MD  Glucosamine-Chondroit-Vit C-Mn (GLUCOSAMINE CHONDR 1500 COMPLX) CAPS Take 1 capsule by mouth daily.   Yes Historical Provider, MD  memantine (NAMENDA XR) 28 MG CP24 24 hr capsule Take 28 mg by mouth daily.   Yes Historical Provider, MD  pantoprazole (PROTONIX) 40 MG tablet Take 40 mg by mouth daily.   Yes Historical Provider, MD  potassium chloride SA (K-DUR,KLOR-CON) 20 MEQ tablet Take 20 mEq by mouth 2 (two) times daily.   Yes Historical Provider, MD  Tamsulosin HCl (FLOMAX) 0.4 MG CAPS Take 0.8 mg by mouth at bedtime.   Yes Historical Provider, MD  warfarin (COUMADIN) 2 MG tablet Take 2 mg by mouth every morning.    Yes Historical Provider, MD  ciprofloxacin (CIPRO) 500 MG tablet Take 1 tablet (500 mg total) by mouth 2 (two) times daily. Patient not taking: Reported on 02/11/2014 08/28/13   Bobby Rumpf York, PA-C   BP 133/84 mmHg  Pulse 120  Temp(Src) 99.9 F (37.7 C) (Rectal)  Resp 23  Ht 5\' 11"  (1.803 m)  Wt 165 lb (74.844 kg)  BMI 23.02 kg/m2  SpO2 100% Physical Exam  Constitutional: He appears well-developed and well-nourished. No distress.  HENT:  Head: Normocephalic and atraumatic.  Eyes: Conjunctivae and EOM are normal. Right eye exhibits no discharge. Left eye exhibits no discharge.  Neck: No JVD present.  Cardiovascular:  Tachycardia with irregularly irregular rhythm. Peripheral edema or tenderness.  Pulmonary/Chest: Effort normal and breath sounds normal. No respiratory distress. He has no  wheezes.  Abdominal: Soft. Bowel sounds are normal. He exhibits no distension.  Lower abdominal tenderness without rebound, rigidity, guarding.  Neurological: He is alert. He exhibits normal muscle tone. Coordination normal.  Skin: Skin is warm and dry. He is not diaphoretic.  Nursing note and vitals reviewed.   ED Course  Procedures (including critical care time) Labs Review Labs Reviewed  URINALYSIS, ROUTINE W REFLEX MICROSCOPIC - Abnormal; Notable for the following:    APPearance CLOUDY (*)    Hgb urine dipstick LARGE (*)    Protein, ur 30 (*)    Leukocytes, UA LARGE (*)    All other components within normal limits  CBC WITH DIFFERENTIAL/PLATELET - Abnormal; Notable for the following:  RBC 4.02 (*)    Hemoglobin 12.0 (*)    HCT 36.8 (*)    Neutrophils Relative % 88 (*)    Neutro Abs 8.4 (*)    Lymphocytes Relative 5 (*)    Lymphs Abs 0.5 (*)    All other components within normal limits  COMPREHENSIVE METABOLIC PANEL - Abnormal; Notable for the following:    GFR calc non Af Amer 48 (*)    GFR calc Af Amer 55 (*)    All other components within normal limits  PROTIME-INR - Abnormal; Notable for the following:    Prothrombin Time 21.1 (*)    INR 1.81 (*)    All other components within normal limits  TROPONIN I - Abnormal; Notable for the following:    Troponin I 0.16 (*)    All other components within normal limits  URINE MICROSCOPIC-ADD ON - Abnormal; Notable for the following:    Squamous Epithelial / LPF FEW (*)    Bacteria, UA MANY (*)    All other components within normal limits  I-STAT CG4 LACTIC ACID, ED - Abnormal; Notable for the following:    Lactic Acid, Venous 4.91 (*)    All other components within normal limits  I-STAT TROPOININ, ED - Abnormal; Notable for the following:    Troponin i, poc 0.12 (*)    All other components within normal limits  I-STAT VENOUS BLOOD GAS, ED - Abnormal; Notable for the following:    pH, Ven 7.357 (*)    pCO2, Ven 33.6 (*)     pO2, Ven 50.0 (*)    Bicarbonate 18.8 (*)    Acid-base deficit 6.0 (*)    All other components within normal limits  I-STAT CG4 LACTIC ACID, ED - Abnormal; Notable for the following:    Lactic Acid, Venous 3.37 (*)    All other components within normal limits  URINE CULTURE  CULTURE, BLOOD (ROUTINE X 2)  CULTURE, BLOOD (ROUTINE X 2)  APTT  BLOOD GAS, VENOUS    Imaging Review Dg Chest Port 1 View  (if Code Sepsis Called)  02/11/2014   CLINICAL DATA:  CODE SEPSIS; ATRIAL FIBRILLATION - Pt from home via [facility] with c/o shaking and fidgeting overnight with fever and decreased urine output. Pt reported lower mid abdominal pain and found to be a-fib  EXAM: PORTABLE CHEST - 1 VIEW  COMPARISON:  06/19/2013  FINDINGS: Cardiac silhouette is mildly enlarged. Aorta is uncoiled and mildly tortuous. No mediastinal or hilar masses or evidence of adenopathy. Clear lungs. No pleural effusion or pneumothorax.  Bony thorax is grossly intact.  IMPRESSION: No acute cardiopulmonary disease.   Electronically Signed   By: Lajean Manes M.D.   On: 02/11/2014 11:04     EKG Interpretation   Date/Time:  Sunday February 11 2014 11:19:46 EST Ventricular Rate:  99 PR Interval:  203 QRS Duration: 78 QT Interval:  374 QTC Calculation: 480 R Axis:   21 Text Interpretation:  Sinus rhythm Ventricular premature complex Confirmed  by Hazle Coca [phone]) on 02/11/2014 12:25:34 PM       MDM   Final diagnoses:  UTI (lower urinary tract infection)  Atrial fibrillation with RVR  Lactic acid blood increased  Severe sepsis   Patient came in with generalized unwell feeling and lower abdominal discomfort in found by EMS to have a fever of 99.9 orally. He was given Tylenol 20 minutes later this was 99.9 rectally. Pt with 2 year history of Foley use due to urinary incontinence.  He has a history of chronic urinary tract infections. His urine today is infected patient also with evidence of sepsis. Lactic acid over 4. Normal  BP. Pt with respiratory compensation for lactic acidosis. Patient given IV fluids as well as started on Vanc and Zosyn. Culture pending. Patient also found to have A. fib with RVR. Patient has history of this and it has been paroxysmal. Patient given Cardizem and blood pressure 154/104. On repeat EKG patient in sinus rhythm with normalized pulse. Patient on a on Coumadin for DVT. She does have elevated troponin 0.16 with i-STAT. Will retreat troponin and lactic acid after 2-3 L bolus. Likely due to Afib. Consult to hospitalist. Spoke with Dr. Sloan Leiter who agrees with admission to step down.  Discussed all results and patient verbalizes understanding and agrees with plan.  This is a shared patient. This patient was discussed with the physician who saw and evaluated the patient and agrees with the plan.   Pura Spice, PA-C 02/11/14 [place], MD 02/11/14 1630

## 2014-02-11 NOTE — ED Notes (Signed)
[facility] PA notified of i-stat troponin 0.12.  Review pt heart rhythm, noted in NSR. Daniel Nones also aware.

## 2014-02-11 NOTE — H&P (Signed)
PATIENT DETAILS Name: Joshua Rodgers Age: 81 y.o. Sex: male Date of Birth: [birth year] Admit Date: 02/11/2014 FAO:[id number],HQIONG D, MD   CHIEF COMPLAINT:  Lower abdominal pain, fever since yesterday  HPI: Joshua Rodgers is a 81 y.o. male with a Past Medical History of dementia, paroxysmal atrial fibrillation and history of DVT on chronic Coumadin therapy, history of prostate cancer, history of chronic Foley catheter in place who presents today with the above noted complaint. Please note, patient is pleasantly confused, most of this history is obtained from patient's spouse and daughter at bedside. At baseline, patient is nonambulatory, and has severe spinal stenosis and has been bed to wheelchair bound for the past 2 years. He also has bilateral foot drop at baseline. Apparently, last evening, patient and developed lower abdominal pain along with subjective fever. Patient's spouse also claimed that he had 4-5 loose stools overnight. Because of worsening pain, patient was brought to the emergency room. Pain is mostly located in the pelvic area, without any radiation. Unable to obtain further history from the patient. Patient's spouse, patient was noted to have shakes and chills throughout the night. On arrival to the emergency room, patient was found to have atrial fibrillation with rapid ventricle response, further evaluation revealed UTI. I was subsequently asked to admit this patient for further evaluation and treatment Per family, there was no headache or chest pain. Patient was observed to have some shortness of breath this morning, however now he is back to his usual baseline. Per RN in the emergency room, patient was noted to have dirty looking urine in the Foley bag and his Foley catheter appeared to be clogged, Foley catheter was changed draining some pink-colored urine initially which has now cleared.  ALLERGIES:   Allergies  Allergen Reactions  . Contrast Media [Iodinated  Diagnostic Agents]     Pt blacked out  . Iohexol      Desc: sob, throat swelling (1988 gdc) pt requires full premeds and does well, JB 8/14/7     PAST MEDICAL HISTORY: Past Medical History  Diagnosis Date  . Generalized weakness   . Hypokalemia   . Renal disorder   . Hyperlipidemia   . Vitamin D deficiency   . Bell's palsy   . Lumbar stenosis   . Hypertension   . Depression   . Dementia   . Prostate cancer ~ 2004    "had radiation tx" (11/24/2012)  . DVT (deep venous thrombosis) 11/24/2012    Acute DVT in the rightcommon femoral vein and acute DVT involving the leftcommon femorall,profinda popliteal and posterior tibial vein 2013. Pt seen by hematology in 2014. W/S revealed continued clot in left leg  . Asthma     "as a child, real bad" (11/24/2012)  . History of bleeding ulcers ~ 1956    "in hospital for ~ 1 month" (11/24/2012)  . Recurrent UTI (urinary tract infection) 2013-now    "lots" (11/24/2012)  . DJD (degenerative joint disease)   . Arthritis     "joints" (11/24/2012)  . Anxiety   . Hyperaldosteronism   . CHF (congestive heart failure)     Diastolic dysfunction  . Vitamin B12 deficiency   . Spinal stenosis     Dr Christella Noa  . Dilated aortic root   . Mild aortic stenosis     echo 2012    PAST SURGICAL HISTORY: Past Surgical History  Procedure Laterality Date  . Total knee arthroplasty Left 02/2010  . Back  surgery  2008    "for stenosis" (11/24/2012)  . Esophagogastroduodenoscopy Left 02/16/2013    Procedure: ESOPHAGOGASTRODUODENOSCOPY (EGD);  Surgeon: Arta Silence, MD;  Location: [facility] ENDOSCOPY;  Service: Endoscopy;  Laterality: Left;    MEDICATIONS AT HOME: Prior to Admission medications   Medication Sig Start Date End Date Taking? Authorizing Provider  ALPRAZolam Duanne Moron) 0.5 MG tablet Take 0.5 mg by mouth 3 (three) times daily as needed for anxiety. For anxiety.    Historical Provider, MD  aMILoride (MIDAMOR) 5 MG tablet Take 10 mg by mouth daily.     Historical Provider, MD  amLODipine (NORVASC) 10 MG tablet Take 10 mg by mouth daily.    Historical Provider, MD  Calcium-Vitamin D (CALTRATE 600 PLUS-VIT D PO) Take 1 tablet by mouth daily.     Historical Provider, MD  cholecalciferol (VITAMIN D) 1000 UNITS tablet Take 1,000 Units by mouth daily.    Historical Provider, MD  ciprofloxacin (CIPRO) 500 MG tablet Take 1 tablet (500 mg total) by mouth 2 (two) times daily. 08/28/13   Bobby Rumpf York, PA-C  citalopram (CELEXA) 20 MG tablet Take 20 mg by mouth daily. 11/11/12   Historical Provider, MD  Cyanocobalamin (VITAMIN B 12 PO) Take 1,000 mg by mouth daily.    Historical Provider, MD  docusate sodium 100 MG CAPS Take 100 mg by mouth 2 (two) times daily. 08/28/13   Bobby Rumpf York, PA-C  ferrous sulfate 324 (65 FE) MG TBEC Take 1 tablet by mouth daily.     Historical Provider, MD  Glucosamine-Chondroit-Vit C-Mn (GLUCOSAMINE CHONDR 1500 COMPLX) CAPS Take 1 capsule by mouth daily.    Historical Provider, MD  pantoprazole (PROTONIX) 40 MG tablet Take 40 mg by mouth daily.    Historical Provider, MD  potassium chloride SA (K-DUR,KLOR-CON) 20 MEQ tablet Take 20 mEq by mouth 2 (two) times daily.    Historical Provider, MD  Tamsulosin HCl (FLOMAX) 0.4 MG CAPS Take 0.8 mg by mouth at bedtime.    Historical Provider, MD  warfarin (COUMADIN) 2 MG tablet Take 2 mg by mouth at bedtime.     Historical Provider, MD    FAMILY HISTORY: Family History  Problem Relation Age of Onset  . Heart disease Mother   . Diabetes Father     SOCIAL HISTORY:  reports that he has quit smoking. His smoking use included Cigarettes. He quit after 10 years of use. He has never used smokeless tobacco. He reports that he drinks alcohol. He reports that he does not use illicit drugs.  REVIEW OF SYSTEMS:  Constitutional:   No  weight loss, night sweats,  Fevers, chills, fatigue.  HEENT:    No headaches, Difficulty swallowing,Tooth/dental problems,Sore  throat  Cardio-vascular: No chest pain,  Orthopnea, PND, swelling in lower extremities, anasarca,  dizziness, palpitations  GI:  No heartburn, indigestion, abdominal pain, nausea, vomiting, diarrhea  Resp: No shortness of breath with exertion or at rest.  No excess mucus, no productive cough, No non-productive cough  Skin:  no rash or lesions.  GU:  no dysuria, change in color of urine, no urgency or frequency.  No flank pain.  Musculoskeletal: No joint pain or swelling.  No decreased range of motion.  No back pain.  Psych: No change in mood or affect. No depression or anxiety.  No memory loss.   PHYSICAL EXAM: Blood pressure 133/84, pulse 120, temperature 99.9 F (37.7 C), temperature source Rectal, resp. rate 23, height 5\' 11"  (1.803 m), weight 74.844 kg (165 lb),  SpO2 100 %.  General appearance :Awake, and pleasantly confused, but does not appear to be in any distress. Somewhat acutely sick looking HEENT: Atraumatic and Normocephalic, pupils equally reactive to light and accomodation Neck: supple, no JVD. No cervical lymphadenopathy.  Chest:Good air entry bilaterally, no added sounds  CVS: S1 S2 regular, no murmurs.  Abdomen: Bowel sounds present, mildly tender in the lower abdomen, but abdomen is soft without guarding or rigidity.  Extremities: B/L Lower Ext shows no edema, both legs are warm to touch Neurology: Awake alert, and oriented X 3, CN II-XII intact, Non focal Skin:No Rash Wounds:N/A  LABS ON ADMISSION:   Recent Labs  02/11/14 1009  NA 143  K 4.4  CL 109  CO2 21  GLUCOSE 98  BUN 20  CREATININE 1.29  CALCIUM 9.5    Recent Labs  02/11/14 1009  AST 24  ALT 9  ALKPHOS 51  BILITOT 0.6  PROT 6.4  ALBUMIN 3.7   No results for input(s): LIPASE, AMYLASE in the last 72 hours.  Recent Labs  02/11/14 1009  WBC 9.7  NEUTROABS 8.4*  HGB 12.0*  HCT 36.8*  MCV 91.5  PLT 208    Recent Labs  02/11/14 1009  TROPONINI 0.16*   No results for  input(s): DDIMER in the last 72 hours. Invalid input(s): POCBNP   RADIOLOGIC STUDIES ON ADMISSION: Dg Chest Port 1 View  (if Code Sepsis Called)  02/11/2014   CLINICAL DATA:  CODE SEPSIS; ATRIAL FIBRILLATION - Pt from home via [facility] with c/o shaking and fidgeting overnight with fever and decreased urine output. Pt reported lower mid abdominal pain and found to be a-fib  EXAM: PORTABLE CHEST - 1 VIEW  COMPARISON:  06/19/2013  FINDINGS: Cardiac silhouette is mildly enlarged. Aorta is uncoiled and mildly tortuous. No mediastinal or hilar masses or evidence of adenopathy. Clear lungs. No pleural effusion or pneumothorax.  Bony thorax is grossly intact.  IMPRESSION: No acute cardiopulmonary disease.   Electronically Signed   By: Lajean Manes M.D.   On: 02/11/2014 11:04     EKG: Independently reviewed. Initial EKG showed A. fib with RVR, subsequent EKG showed sinus rhythm  ASSESSMENT AND PLAN: Present on Admission:  . Sepsis: Secondary to urinary tract infection related to a chronic indwelling Foley catheter. Although has some diarrhea, doubt this to be the cause. But given recurrent hospitalizations, will obtain C. difficile PCR. Continue with IV fluids, start empiric cefepime, follow blood and urine cultures. Monitor and stepdown   . UTI (lower urinary tract infection): Likely related to chronic indwelling Foley catheter.Foley catheter has been changed, will be empirically started on cefepime, blood and urine cultures will need to be followed. Follow clinical course. Suspect abdominal pain on initial presentation was most related to acute urinary retention from a clogged Foley catheter which likely precipitated UTI.   Marland Kitchen Toxic metabolic encephalopathy: Likely secondary to sepsis, nonfocal exam. Has dementia at baseline. Per family, mental status significantly better and very close to usual baseline.   . Atrial fibrillation with RVR: Initially required IV Cardizem, now subsequently has spontaneously  converted to sinus rhythm. Monitor in telemetry, Coumadin to be dosed by pharmacy while inpatient.   . Elevated troponin: Likely secondary to demand ischemia, doubt any workup needed at present. Cycle troponins and monitor in telemetry status stepdown. Note patient is chest pain-free.  . Lumbar stenosis with bilateral foot drop: Chronic issue. Patient is bed to wheelchair bound. PT eval and more stable  . HTN (hypertension):  Continue with amlodipine. Monitor closely and adjust medications accordingly.   . Lower abdominal pain: Suspect this is secondary to urinary retention from a clogged Foley catheter, this is mostly resolved. Abdomen is soft, follow clinically.   . Chronic diastolic CHF (congestive heart failure): Clinically compensated, watch closely while on IV fluid. Hold diuretics for now. Resume when able.   . Bladder, atonic: History of chronic Foley catheter in place. Monitor.   Further plan will depend as patient's clinical course evolves and further radiologic and laboratory data become available. Patient will be monitored closely.  Above noted plan was discussed with patientspouse, they were in agreement.   DVT Prophylaxis: Prophylactic Heparin  Code Status: Full Code  Disposition Plan: Home when stable  Total time spent for admission equals 45 minutes.  [facility] Pager [phone]  If 7PM-7AM, please contact night-coverage [email or web address] Password [facility] 02/11/2014, 12:26 PM

## 2014-02-11 NOTE — Progress Notes (Addendum)
ANTIBIOTIC & ANTICOAGULATION CONSULT NOTE - INITIAL  Pharmacy Consult for Cefepime & Warfarin Indication: Sepsis & Afib  Allergies  Allergen Reactions  . Contrast Media [Iodinated Diagnostic Agents]     Pt blacked out  . Iohexol      Desc: sob, throat swelling (1988 gdc) pt requires full premeds and does well, JB 8/14/7    Patient Measurements: Height: 5\' 11"  (180.3 cm) Weight: 165 lb (74.844 kg) IBW/kg (Calculated) : 75.3  Vital Signs: Temp: 99.9 F (37.7 C) (01/31 0955) Temp Source: Rectal (01/31 0955) BP: 164/95 mmHg (01/31 1500) Pulse Rate: 96 (01/31 1500) Intake/Output from previous day:   Intake/Output from this shift: Total I/O In: 2750 [I.V.:2750] Out: 1030 [Urine:1030]  Labs:  Recent Labs  02/11/14 1009  WBC 9.7  HGB 12.0*  PLT 208  CREATININE 1.29   Estimated Creatinine Clearance: 41.9 mL/min (by C-G formula based on Cr of 1.29). No results for input(s): VANCOTROUGH, VANCOPEAK, VANCORANDOM, GENTTROUGH, GENTPEAK, GENTRANDOM, TOBRATROUGH, TOBRAPEAK, TOBRARND, AMIKACINPEAK, AMIKACINTROU, AMIKACIN in the last 72 hours.   Microbiology: No results found for this or any previous visit (from the past 720 hour(s)).  Medical History: Past Medical History  Diagnosis Date  . Generalized weakness   . Hypokalemia   . Renal disorder   . Hyperlipidemia   . Vitamin D deficiency   . Bell's palsy   . Lumbar stenosis   . Hypertension   . Depression   . Dementia   . Prostate cancer ~ 2004    "had radiation tx" (11/24/2012)  . DVT (deep venous thrombosis) 11/24/2012    Acute DVT in the rightcommon femoral vein and acute DVT involving the leftcommon femorall,profinda popliteal and posterior tibial vein 2013. Pt seen by hematology in 2014. W/S revealed continued clot in left leg  . Asthma     "as a child, real bad" (11/24/2012)  . History of bleeding ulcers ~ 1956    "in hospital for ~ 1 month" (11/24/2012)  . Recurrent UTI (urinary tract infection) 2013-now     "lots" (11/24/2012)  . DJD (degenerative joint disease)   . Arthritis     "joints" (11/24/2012)  . Anxiety   . Hyperaldosteronism   . CHF (congestive heart failure)     Diastolic dysfunction  . Vitamin B12 deficiency   . Spinal stenosis     Dr Christella Noa  . Dilated aortic root   . Mild aortic stenosis     echo 2012    Medications:  Scheduled:  . [START ON 02/12/2014] amLODipine  5 mg Oral Daily  . ceFEPime (MAXIPIME) IV  2 g Intravenous Once  . citalopram  20 mg Oral Daily  . ferrous sulfate  1 tablet Oral Daily  . heparin  5,000 Units Subcutaneous 3 times per day  . pantoprazole  40 mg Oral Daily  . sodium chloride  3 mL Intravenous Q12H  . [START ON 02/12/2014] tamsulosin  0.8 mg Oral QHS   Infusions:  . sodium chloride     Assessment:  CC: Joshua Rodgers is an 81 y.o. male admitted on 02/11/2014 presenting with shaking and fidgeting with fever and decreased UO.  Patient is nonambulatory and has severe spinal stenosis.  Patient found to be in Afib in ED with RVR.  Foley cath appeared to be clogged and therefore changed (likely cause of UTI).  Pharmacy has been consulted to dose Cefepime and warfarin for suspected Sepsis and Afib, respectively.    PMH: A. fib, DVT currently on Coumadin, CHF, recurrent  UTIs, hypertension, dementia  Anticoagulation / Heme: Hx DVT - PTA warfarin 2mg  daily.  INR admit 1.81. Hgb low 12, plt wnl.  Iron tab daily.      Infectious Disease: Sepsis 2/2 to UTI - WBC 9.7, Tmax 99.9, LA 3.37 up.  RR wnl.    1/31 Cefepime >> Zosyn 3.375 g IV x 1, Vancomycin 1 g IV x 1 in ED  1/31 C. Diff >> 1/31 UC >> 1/31 bld x 2 >>  Cardiovascular: Afib w/ RVR - Give IV cardizem in ED, now NSR.  HTN - BP elevated 164/95, HR tachy 103. dCHF - Wt admit.  Trop 0.12. Amlodipine 5mg .  Diuretics on hold.    Nephrology: CrCl ~40-45 mL/min.  Lytes wnl.  Flomax.   Plan:  - Cefepime 2 g IV Q24H - Monitor clinical efficacy, renal function, and LOT - Follow up cultures -  Warfarin 2.5 mg PO x 1 tonight - Daily INR - Monitor for signs and symptoms of bleeding  Hassie Bruce, Pharm. D. Clinical Pharmacy Resident Pager: [phone] Ph: [phone] 02/11/2014 3:32 PM

## 2014-02-11 NOTE — ED Notes (Signed)
Pt urinated after catheter removed was bloody and clear.  Wife reported pt does not normally have blood when catheter is changed.  Notified [facility], [place]

## 2014-02-12 DIAGNOSIS — M4806 Spinal stenosis, lumbar region: Secondary | ICD-10-CM

## 2014-02-12 DIAGNOSIS — N312 Flaccid neuropathic bladder, not elsewhere classified: Secondary | ICD-10-CM

## 2014-02-12 LAB — BASIC METABOLIC PANEL
Anion gap: 8 (ref 5–15)
BUN: 15 mg/dL (ref 6–23)
CALCIUM: 8.9 mg/dL (ref 8.4–10.5)
CHLORIDE: 110 mmol/L (ref 96–112)
CO2: 26 mmol/L (ref 19–32)
CREATININE: 1.17 mg/dL (ref 0.50–1.35)
GFR calc Af Amer: 62 mL/min — ABNORMAL LOW (ref >90)
GFR calc non Af Amer: 54 mL/min — ABNORMAL LOW (ref >90)
GLUCOSE: 92 mg/dL (ref 70–99)
Potassium: 3.5 mmol/L (ref 3.5–5.1)
SODIUM: 144 mmol/L (ref 135–145)

## 2014-02-12 LAB — PROTIME-INR
INR: 2.15 — AB (ref 0.00–1.49)
PROTHROMBIN TIME: 24.2 s — AB (ref 11.6–15.2)

## 2014-02-12 LAB — CBC
HCT: 32.5 % — ABNORMAL LOW (ref 39.0–52.0)
Hemoglobin: 10.5 g/dL — ABNORMAL LOW (ref 13.0–17.0)
MCH: 29.7 pg (ref 26.0–34.0)
MCHC: 32.3 g/dL (ref 30.0–36.0)
MCV: 92.1 fL (ref 78.0–100.0)
PLATELETS: 163 10*3/uL (ref 150–400)
RBC: 3.53 MIL/uL — ABNORMAL LOW (ref 4.22–5.81)
RDW: 15.2 % (ref 11.5–15.5)
WBC: 5.1 10*3/uL (ref 4.0–10.5)

## 2014-02-12 LAB — LACTIC ACID, PLASMA: Lactic Acid, Venous: 1.7 mmol/L (ref 0.5–2.0)

## 2014-02-12 LAB — TROPONIN I: TROPONIN I: 0.15 ng/mL — AB (ref <0.031)

## 2014-02-12 MED ORDER — WARFARIN SODIUM 2 MG PO TABS
2.0000 mg | ORAL_TABLET | Freq: Once | ORAL | Status: AC
  Administered 2014-02-12: 2 mg via ORAL
  Filled 2014-02-12 (×2): qty 1

## 2014-02-12 MED ORDER — WHITE PETROLATUM GEL
Status: AC
  Administered 2014-02-12: 0.2
  Filled 2014-02-12: qty 1

## 2014-02-12 MED ORDER — WARFARIN - PHARMACIST DOSING INPATIENT
Freq: Every day | Status: DC
  Administered 2014-02-13 – 2014-02-14 (×2)

## 2014-02-12 MED ORDER — CEFTRIAXONE SODIUM IN DEXTROSE 20 MG/ML IV SOLN
1.0000 g | INTRAVENOUS | Status: DC
  Administered 2014-02-12: 1 g via INTRAVENOUS
  Filled 2014-02-12 (×2): qty 50

## 2014-02-12 NOTE — Progress Notes (Signed)
ANTICOAGULATION CONSULT NOTE - Follow Up Consult  Pharmacy Consult for Coumadin Indication: DVT history  Allergies  Allergen Reactions  . Contrast Media [Iodinated Diagnostic Agents]     Pt blacked out  . Iohexol      Desc: sob, throat swelling (1988 gdc) pt requires full premeds and does well, JB 8/14/7     Labs:  Recent Labs  02/11/14 1009 02/11/14 1715 02/11/14 2038 02/12/14 0300  HGB 12.0*  --   --  10.5*  HCT 36.8*  --   --  32.5*  PLT 208  --   --  163  APTT 34  --   --   --   LABPROT 21.1*  --   --  24.2*  INR 1.81*  --   --  2.15*  CREATININE 1.29  --   --  1.17  TROPONINI 0.16* 0.24* 0.17* 0.15*    Estimated Creatinine Clearance: 46.2 mL/min (by C-G formula based on Cr of 1.17).   Assessment: 81 year old male admitted with abdominal pain and fever on Coumadin PTA for DVT history Admit INR low at 1.81, now therapeutic at 2.15  Goal of Therapy:  INR 2-3 Monitor platelets by anticoagulation protocol: Yes   Plan:  Coumadin 2 mg po x 1 dose tonight at 1800 pm Daily INR  Thank you. Anette Guarneri, PharmD [phone]  02/12/2014,10:43 AM

## 2014-02-12 NOTE — Progress Notes (Signed)
TEAM 1 - Stepdown/ICU TEAM Progress Note  Joshua Rodgers UMP:[id number] DOB: 04/23/1923 DOA: 02/11/2014 PCP: Kandice Hams, MD  Admit HPI / Brief Narrative: 81 y.o. male with a history of dementia, paroxysmal atrial fibrillation, DVT on chronic Coumadin therapy, prostate cancer with chronic Foley catheter in place who presented today with lower abdom pain and fever.  At baseline, patient is nonambulatory due to severe spinal stenosis and has been bed to wheelchair bound for the past 2 years. He also has bilateral foot drop at baseline. Apparently patient and developed lower abdominal pain along with subjective fever. Patient's spouse also claimed he had 4-5 loose stools. Because of worsening pain, patient was brought to the emergency room.   On arrival to the emergency room patient was found to have atrial fibrillation with rapid ventricle response, further evaluation revealed UTI.   HPI/Subjective: Pt is alert and a pleasure to speak with.  He denies any complaints, and his wife states he is back to his usual mental status (they have been married 60years).  He denies cp, n/v, or abdom pain.    Assessment/Plan:  Sepsis due to Gram neg rod UTI related to chronic foley w/ lactic acidosis  Narrow abx to rocephin - follow clinically - follow speciation and sensitivities   Diarrhea  Appears to have resolved for now - likely related to UTI/pyelo  Chronic indwelling foley - hx atonic bladder Was changed at presentation to ER  Acute toxic metabolic encephalopathy in setting of Dementia  Has resolved - back to baseline mental status  Chronic afib w/ acute RVR  Now having some relative bradycardia - watch on tele - avoid rate slowing meds   Elevated troponin Peaked at 0.24 - most c/w stress related to sepsis and not true ACS  Lumbar spinal stenosis  HTN Follow BP w/o change today   DVT Nov 2014  Code Status: FULL Family Communication:  Spoke w/ wife at bedside at  length Disposition Plan: SDU   Consultants: none  Procedures: none  Antibiotics: Cefepime 1/31 > 2/01 Zosyn 1/31 Vanc 1/31 Rocephin 2/1 >  DVT prophylaxis: Warfarin   Objective: Blood pressure 139/108, pulse 50, temperature 98.6 F (37 C), temperature source Oral, resp. rate 22, height 5\' 11"  (1.803 m), weight 74.8 kg (164 lb 14.5 oz), SpO2 100 %.  Intake/Output Summary (Last 24 hours) at 02/12/14 1823 Last data filed at 02/12/14 1800  Gross per 24 hour  Intake   3726 ml  Output   2775 ml  Net    951 ml   Exam: General: No acute respiratory distress Lungs: Clear to auscultation bilaterally without wheezes or crackles Cardiovascular: Regular rate and rhythm without murmur gallop or rub normal S1 and S2 Abdomen: Nontender, nondistended, soft, bowel sounds positive, no rebound, no ascites, no appreciable mass Extremities: No significant cyanosis, clubbing, or edema bilateral lower extremities  Data Reviewed: Basic Metabolic Panel:  Recent Labs Lab 02/11/14 1009 02/12/14 0300  NA 143 144  K 4.4 3.5  CL 109 110  CO2 21 26  GLUCOSE 98 92  BUN 20 15  CREATININE 1.29 1.17  CALCIUM 9.5 8.9    Liver Function Tests:  Recent Labs Lab 02/11/14 1009  AST 24  ALT 9  ALKPHOS 51  BILITOT 0.6  PROT 6.4  ALBUMIN 3.7   Coags:  Recent Labs Lab 02/11/14 1009 02/12/14 0300  INR 1.81* 2.15*    Recent Labs Lab 02/11/14 1009  APTT 34    CBC:  Recent  Labs Lab 02/11/14 1009 02/12/14 0300  WBC 9.7 5.1  NEUTROABS 8.4*  --   HGB 12.0* 10.5*  HCT 36.8* 32.5*  MCV 91.5 92.1  PLT 208 163    Cardiac Enzymes:  Recent Labs Lab 02/11/14 1009 02/11/14 1715 02/11/14 2038 02/12/14 0300  TROPONINI 0.16* 0.24* 0.17* 0.15*    Recent Results (from the past 240 hour(s))  Blood Culture (routine x 2)     Status: None (Preliminary result)   Collection Time: 02/11/14  9:59 AM  Result Value Ref Range Status   Specimen Description BLOOD RIGHT FOREARM  Final    Special Requests BOTTLES DRAWN AEROBIC AND ANAEROBIC 5CC  Final   Culture   Final           BLOOD CULTURE RECEIVED NO GROWTH TO DATE CULTURE WILL BE HELD FOR 5 DAYS BEFORE ISSUING A FINAL NEGATIVE REPORT Performed at [facility]    Report Status PENDING  Incomplete  Urine culture     Status: None (Preliminary result)   Collection Time: 02/11/14 10:08 AM  Result Value Ref Range Status   Specimen Description URINE, CATHETERIZED  Final   Special Requests NONE  Final   Colony Count   Final    >=100,000 COLONIES/ML Performed at [facility]    Culture   Final    [place] Performed at [facility]    Report Status PENDING  Incomplete  Blood Culture (routine x 2)     Status: None (Preliminary result)   Collection Time: 02/11/14 10:53 AM  Result Value Ref Range Status   Specimen Description BLOOD RIGHT HAND  Final   Special Requests BOTTLES DRAWN AEROBIC AND ANAEROBIC 10CC  Final   Culture   Final           BLOOD CULTURE RECEIVED NO GROWTH TO DATE CULTURE WILL BE HELD FOR 5 DAYS BEFORE ISSUING A FINAL NEGATIVE REPORT Performed at [facility]    Report Status PENDING  Incomplete  MRSA PCR Screening     Status: None   Collection Time: 02/11/14  2:40 PM  Result Value Ref Range Status   MRSA by PCR NEGATIVE NEGATIVE Final    Comment:        The GeneXpert MRSA Assay (FDA approved for NASAL specimens only), is one component of a comprehensive MRSA colonization surveillance program. It is not intended to diagnose MRSA infection nor to guide or monitor treatment for MRSA infections.      Studies:  Recent x-ray studies have been reviewed in detail by the Attending Physician  Scheduled Meds:  Scheduled Meds: . amLODipine  5 mg Oral Daily  . ceFEPime (MAXIPIME) IV  2 g Intravenous Q24H  . citalopram  20 mg Oral Daily  . ferrous sulfate  325 mg Oral Daily  . pantoprazole  40 mg Oral Daily  . sodium chloride  3 mL Intravenous Q12H  .  tamsulosin  0.8 mg Oral QHS  . Warfarin - Pharmacist Dosing Inpatient   Does not apply q1800    Time spent on care of this patient: 35 mins   [facility] T , MD   [facility] Office  [phone] Pager - Text Page per Amion as per below:  On-Call/Text Page:      [email or web address]      password [email or web address]  If 7PM-7AM, please contact night-coverage [email or web address] Password [email or web address] 02/12/2014, 6:23 PM   LOS: 1 day

## 2014-02-13 DIAGNOSIS — I10 Essential (primary) hypertension: Secondary | ICD-10-CM

## 2014-02-13 DIAGNOSIS — Z86718 Personal history of other venous thrombosis and embolism: Secondary | ICD-10-CM

## 2014-02-13 DIAGNOSIS — N390 Urinary tract infection, site not specified: Secondary | ICD-10-CM

## 2014-02-13 LAB — COMPREHENSIVE METABOLIC PANEL
ALBUMIN: 3 g/dL — AB (ref 3.5–5.2)
ALT: 9 U/L (ref 0–53)
ANION GAP: 6 (ref 5–15)
AST: 17 U/L (ref 0–37)
Alkaline Phosphatase: 39 U/L (ref 39–117)
BUN: 19 mg/dL (ref 6–23)
CO2: 24 mmol/L (ref 19–32)
CREATININE: 1.38 mg/dL — AB (ref 0.50–1.35)
Calcium: 8.1 mg/dL — ABNORMAL LOW (ref 8.4–10.5)
Chloride: 112 mmol/L (ref 96–112)
GFR calc non Af Amer: 44 mL/min — ABNORMAL LOW (ref >90)
GFR, EST AFRICAN AMERICAN: 51 mL/min — AB (ref >90)
GLUCOSE: 107 mg/dL — AB (ref 70–99)
Potassium: 2.8 mmol/L — ABNORMAL LOW (ref 3.5–5.1)
Sodium: 142 mmol/L (ref 135–145)
Total Bilirubin: 0.3 mg/dL (ref 0.3–1.2)
Total Protein: 4.9 g/dL — ABNORMAL LOW (ref 6.0–8.3)

## 2014-02-13 LAB — MAGNESIUM: MAGNESIUM: 1.4 mg/dL — AB (ref 1.5–2.5)

## 2014-02-13 LAB — PROTIME-INR
INR: 2.3 — AB (ref 0.00–1.49)
Prothrombin Time: 25.5 seconds — ABNORMAL HIGH (ref 11.6–15.2)

## 2014-02-13 LAB — CBC
HEMATOCRIT: 30.8 % — AB (ref 39.0–52.0)
HEMOGLOBIN: 10 g/dL — AB (ref 13.0–17.0)
MCH: 29.9 pg (ref 26.0–34.0)
MCHC: 32.5 g/dL (ref 30.0–36.0)
MCV: 92.2 fL (ref 78.0–100.0)
Platelets: 153 10*3/uL (ref 150–400)
RBC: 3.34 MIL/uL — ABNORMAL LOW (ref 4.22–5.81)
RDW: 15.1 % (ref 11.5–15.5)
WBC: 4 10*3/uL (ref 4.0–10.5)

## 2014-02-13 LAB — POTASSIUM: Potassium: 2.5 mmol/L — CL (ref 3.5–5.1)

## 2014-02-13 MED ORDER — POTASSIUM CHLORIDE 10 MEQ/100ML IV SOLN
10.0000 meq | INTRAVENOUS | Status: AC
  Administered 2014-02-13 (×4): 10 meq via INTRAVENOUS
  Filled 2014-02-13 (×4): qty 100

## 2014-02-13 MED ORDER — POTASSIUM CHLORIDE 10 MEQ/100ML IV SOLN
10.0000 meq | Freq: Once | INTRAVENOUS | Status: AC
  Administered 2014-02-13: 10 meq via INTRAVENOUS
  Filled 2014-02-13: qty 100

## 2014-02-13 MED ORDER — METOPROLOL TARTRATE 12.5 MG HALF TABLET
12.5000 mg | ORAL_TABLET | Freq: Two times a day (BID) | ORAL | Status: DC
  Administered 2014-02-13 – 2014-02-15 (×5): 12.5 mg via ORAL
  Filled 2014-02-13 (×6): qty 1

## 2014-02-13 MED ORDER — POTASSIUM CHLORIDE 10 MEQ/100ML IV SOLN
10.0000 meq | INTRAVENOUS | Status: AC
  Administered 2014-02-13 (×2): 10 meq via INTRAVENOUS
  Filled 2014-02-13 (×4): qty 100

## 2014-02-13 MED ORDER — MAGNESIUM SULFATE 50 % IJ SOLN
3.0000 g | Freq: Once | INTRAMUSCULAR | Status: AC
  Administered 2014-02-13: 3 g via INTRAVENOUS
  Filled 2014-02-13: qty 6

## 2014-02-13 MED ORDER — CEFUROXIME AXETIL 250 MG PO TABS
250.0000 mg | ORAL_TABLET | Freq: Two times a day (BID) | ORAL | Status: DC
  Administered 2014-02-13 – 2014-02-15 (×5): 250 mg via ORAL
  Filled 2014-02-13 (×7): qty 1

## 2014-02-13 MED ORDER — POTASSIUM CHLORIDE CRYS ER 20 MEQ PO TBCR
40.0000 meq | EXTENDED_RELEASE_TABLET | Freq: Once | ORAL | Status: AC
  Administered 2014-02-13: 40 meq via ORAL
  Filled 2014-02-13: qty 2

## 2014-02-13 MED ORDER — WARFARIN SODIUM 2 MG PO TABS
2.0000 mg | ORAL_TABLET | Freq: Every day | ORAL | Status: DC
  Administered 2014-02-13: 2 mg via ORAL
  Filled 2014-02-13 (×2): qty 1

## 2014-02-13 NOTE — Progress Notes (Signed)
Orthopedic Tech Progress Note Patient Details:  Joshua Rodgers 03/10/1923 [id number] Brace order completed by [employer] vendor. Patient ID: [name], male   DOB: [birth year], 81 y.o.   MRN: [id number]   Braulio Bosch 02/13/2014, 6:49 PM

## 2014-02-13 NOTE — Progress Notes (Signed)
OT Cancellation Note  Patient Details Name: Joshua Rodgers MRN: [id number] DOB: 12-07-1923   Cancelled Treatment:    Reason Eval/Treat Not Completed: OT screened. Spoke with PT and pt with no acute OT needs.  Benito Mccreedy OTR/L [phone] 02/13/2014, 1:22 PM

## 2014-02-13 NOTE — Significant Event (Signed)
CRITICAL VALUE ALERT  Critical value received:  Potassium 2.5  Date of notification:  02/13/14  Time of notification: 1683  Critical value read back: yes  Nurse who received alert:  Guy Begin RN  MD notified (1st page):  Dr. Sherral Hammers  Time of first page:  1455  MD notified (2nd page):  Time of second page:  Responding MD:  Dr. Sherral Hammers  Time MD responded:  1455. Awaiting results of magnesium level.

## 2014-02-13 NOTE — Progress Notes (Signed)
ANTICOAGULATION CONSULT NOTE - Follow Up Consult  Pharmacy Consult for Coumadin Indication: DVT history  Allergies  Allergen Reactions  . Contrast Media [Iodinated Diagnostic Agents]     Pt blacked out  . Iohexol      Desc: sob, throat swelling (1988 gdc) pt requires full premeds and does well, JB 8/14/7     Labs:  Recent Labs  02/11/14 1009 02/11/14 1715 02/11/14 2038 02/12/14 0300 02/13/14 0115  HGB 12.0*  --   --  10.5* 10.0*  HCT 36.8*  --   --  32.5* 30.8*  PLT 208  --   --  163 153  APTT 34  --   --   --   --   LABPROT 21.1*  --   --  24.2* 25.5*  INR 1.81*  --   --  2.15* 2.30*  CREATININE 1.29  --   --  1.17 1.38*  TROPONINI 0.16* 0.24* 0.17* 0.15*  --     Estimated Creatinine Clearance: 39.1 mL/min (by C-G formula based on Cr of 1.38).   Assessment: 81 year old male admitted with abdominal pain and fever on Coumadin PTA for DVT history Admit INR low at 1.81, now therapeutic at 2.30  Goal of Therapy:  INR 2-3 Monitor platelets by anticoagulation protocol: Yes   Plan:  Coumadin 2 mg po daily at 1800 pm Daily INR for now  Thank you. Anette Guarneri, PharmD [phone]  02/13/2014,11:05 AM

## 2014-02-13 NOTE — Progress Notes (Signed)
Pt. Potassium 2.8. K. Schorr paged and wrote orders for 4 runs of potassium. Will continue to monitor pt.

## 2014-02-13 NOTE — Progress Notes (Addendum)
[place] TEAM 1 - Stepdown/ICU TEAM Progress Note  Joshua Rodgers [id number]:[id number] DOB: 09/13/1923 DOA: 02/11/2014 PCP: Joshua Hams, MD  Admit HPI / Brief Narrative: 81 y.o.BM PMHx Dementia, Anxiety, Depression, HLD, HTN, paroxysmal A-Fib, DVT on chronic Coumadin therapy, Prostate cancer S/P XRT 11/2012, Chronic Foley catheter in place (recurrent UTI) Presented today with the above noted complaint. Please note, patient is pleasantly confused, most of this history is obtained from patient's spouse and daughter at bedside. At baseline, patient is nonambulatory, and has severe spinal stenosis and has been bed to wheelchair bound for the past 2 years. He also has bilateral foot drop at baseline. Apparently, last evening, patient and developed lower abdominal pain along with subjective fever. Patient's spouse also claimed that he had 4-5 loose stools overnight. Because of worsening pain, patient was brought to the emergency room. Pain is mostly located in the pelvic area, without any radiation. Unable to obtain further history from the patient. Patient's spouse, patient was noted to have shakes and chills throughout the night. On arrival to the emergency room, patient was found to have atrial fibrillation with rapid ventricle response, further evaluation revealed UTI. I was subsequently asked to admit this patient for further evaluation and treatment Per family, there was no headache or chest pain. Patient was observed to have some shortness of breath this morning, however now he is back to his usual baseline. Per RN in the emergency room, patient was noted to have dirty looking urine in the Foley bag and his Foley catheter appeared to be clogged, Foley catheter was changed draining some pink-colored urine initially which has now cleared.   HPI/Subjective: 2/2 A/O 3 (does not know where), negative N/V, negative abdominal pain, negative CVA tenderness. Wife states she has home health  aide   Assessment/Plan: Sepsis due to Gram neg rod UTI related to chronic foley w/ lactic acidosis  -Patient with indwelling catheter responding to ceftriaxone, will DC ceftriaxone and treat with ceftin for total of 14 days. -Speciation and sensitivities pending but patient clinically improving -Ceftin 250 mg BID -Creatinine trending up prerenal secondary to infection? Increase normal saline to 159ml/hr -DC in a.m. if stable  Diarrhea  -Resolved, D/C enteric precautions   Chronic indwelling foley - hx atonic bladder - Was changed at presentation to ER(per wife changed approximately once a month)  Acute toxic metabolic encephalopathy in setting of Dementia  -Per wife back to baseline   Chronic afib w/ acute RVR  - Patient now tachycardic DC amlodipine  -Start metoprolol 12.5 mg  BID   Chronic Diastolic CHF  -Strict in and out -Daily weight admission weight =74.8 kg  Hypokalemia -Potassium goal>4 -Potassium IV 10 mEq 4 runs -Repeat potassium IV 10 mEq x3 runs -K-Dur 40 mEq 1  Hypomagnesemia  -Magnesium goal > 2  -Magnesium IV 3 gm 1   Elevated troponin -Peaked at 0.24 - most c/w stress related to sepsis and not true ACS  Lumbar spinal stenosis  HTN -Follow BP w/o change today   DVT Nov 2014 -Continue Coumadin per pharmacy -Current INR= 2.3  Bilateral foot drop -Apply bilateral Unna boots   Code Status: FULL Family Communication: Wife present at time of exam Disposition Plan: DC in a.m. if creatinine stable, and tolerates PO antibiotic    Consultants: NA  Procedure/Significant Events: NA   Culture 1/31 blood right forearm/hand NGTD 1/31 urine positive GNR 1/31 MRSA by PCR negative   Antibiotics: Cefepime 1/31 > 2/01 Zosyn 1/31 1 dose Vanc 1/31  1 dose Rocephin 2/1 > stopped 2/2 Ceftin 2/2>>   DVT prophylaxis: Warfarin   Devices NA   LINES / TUBES:  NA    Continuous Infusions: . sodium chloride 60 mL/hr at 02/13/14 0514     Objective: VITAL SIGNS: Temp: 97.7 F (36.5 C) (02/02 1100) Temp Source: Axillary (02/02 0747) BP: 147/77 mmHg (02/02 1107) Pulse Rate: 35 (02/02 1107) SPO2; FIO2:   Intake/Output Summary (Last 24 hours) at 02/13/14 1253 Last data filed at 02/13/14 1107  Gross per 24 hour  Intake 2369.33 ml  Output   1660 ml  Net 709.33 ml     Exam: General: A/O 3, NAD, No acute respiratory distress Lungs: Clear to auscultation bilaterally without wheezes or crackles Cardiovascular: Irregular irregular rhythm and rate without murmur gallop or rub normal S1 and S2 Abdomen: Nontender, nondistended, soft, bowel sounds positive, no rebound, no ascites, no appreciable mass, negative CVA tenderness Extremities: No significant cyanosis, clubbing, or edema bilateral lower extremities  Data Reviewed: Basic Metabolic Panel:  Recent Labs Lab 02/11/14 1009 02/12/14 0300 02/13/14 0115  NA 143 144 142  K 4.4 3.5 2.8*  CL 109 110 112  CO2 21 26 24   GLUCOSE 98 92 107*  BUN 20 15 19   CREATININE 1.29 1.17 1.38*  CALCIUM 9.5 8.9 8.1*   Liver Function Tests:  Recent Labs Lab 02/11/14 1009 02/13/14 0115  AST 24 17  ALT 9 9  ALKPHOS 51 39  BILITOT 0.6 0.3  PROT 6.4 4.9*  ALBUMIN 3.7 3.0*   No results for input(s): LIPASE, AMYLASE in the last 168 hours. No results for input(s): AMMONIA in the last 168 hours. CBC:  Recent Labs Lab 02/11/14 1009 02/12/14 0300 02/13/14 0115  WBC 9.7 5.1 4.0  NEUTROABS 8.4*  --   --   HGB 12.0* 10.5* 10.0*  HCT 36.8* 32.5* 30.8*  MCV 91.5 92.1 92.2  PLT 208 163 153   Cardiac Enzymes:  Recent Labs Lab 02/11/14 1009 02/11/14 1715 02/11/14 2038 02/12/14 0300  TROPONINI 0.16* 0.24* 0.17* 0.15*   BNP (last 3 results) No results for input(s): BNP in the last 8760 hours.  ProBNP (last 3 results) No results for input(s): PROBNP in the last 8760 hours.  CBG: No results for input(s): GLUCAP in the last 168 hours.  Recent Results (from the  past 240 hour(s))  Blood Culture (routine x 2)     Status: None (Preliminary result)   Collection Time: 02/11/14  9:59 AM  Result Value Ref Range Status   Specimen Description BLOOD RIGHT FOREARM  Final   Special Requests BOTTLES DRAWN AEROBIC AND ANAEROBIC 5CC  Final   Culture   Final           BLOOD CULTURE RECEIVED NO GROWTH TO DATE CULTURE WILL BE HELD FOR 5 DAYS BEFORE ISSUING A FINAL NEGATIVE REPORT Performed at [facility]    Report Status PENDING  Incomplete  Urine culture     Status: None (Preliminary result)   Collection Time: 02/11/14 10:08 AM  Result Value Ref Range Status   Specimen Description URINE, CATHETERIZED  Final   Special Requests NONE  Final   Colony Count   Final    >=100,000 COLONIES/ML Performed at [facility]    Culture   Final    [place] Performed at [facility]    Report Status PENDING  Incomplete  Blood Culture (routine x 2)     Status: None (Preliminary result)   Collection  Time: 02/11/14 10:53 AM  Result Value Ref Range Status   Specimen Description BLOOD RIGHT HAND  Final   Special Requests BOTTLES DRAWN AEROBIC AND ANAEROBIC 10CC  Final   Culture   Final           BLOOD CULTURE RECEIVED NO GROWTH TO DATE CULTURE WILL BE HELD FOR 5 DAYS BEFORE ISSUING A FINAL NEGATIVE REPORT Performed at [facility]    Report Status PENDING  Incomplete  MRSA PCR Screening     Status: None   Collection Time: 02/11/14  2:40 PM  Result Value Ref Range Status   MRSA by PCR NEGATIVE NEGATIVE Final    Comment:        The GeneXpert MRSA Assay (FDA approved for NASAL specimens only), is one component of a comprehensive MRSA colonization surveillance program. It is not intended to diagnose MRSA infection nor to guide or monitor treatment for MRSA infections.      Studies:  Recent x-ray studies have been reviewed in detail by the Attending Physician  Scheduled Meds:  Scheduled Meds: . amLODipine  5 mg Oral  Daily  . cefTRIAXone (ROCEPHIN)  IV  1 g Intravenous Q24H  . citalopram  20 mg Oral Daily  . ferrous sulfate  325 mg Oral Daily  . pantoprazole  40 mg Oral Daily  . sodium chloride  3 mL Intravenous Q12H  . tamsulosin  0.8 mg Oral QHS  . warfarin  2 mg Oral q1800  . Warfarin - Pharmacist Dosing Inpatient   Does not apply q1800    Time spent on care of this patient: 40 mins   [facility]ie Bossier [facility]  [facility] Office  [phone] Pager - [phone]  On-Call/Text Page:      [email or web address]      password [email or web address]  If 7PM-7AM, please contact night-coverage [email or web address] Password [email or web address] 02/13/2014, 12:53 PM   LOS: 2 days   Care during the described time interval was provided by me .  I have reviewed this patient's available data, including medical history, events of note, physical examination, radiology studies and test results as part of my evaluation  Dia Crawford, MD [phone] Pager

## 2014-02-13 NOTE — Progress Notes (Signed)
Orthopedic Tech Progress Note Patient Details:  Joshua Rodgers [birth year] [id number] Called [employer] for brace order. Patient ID: Joshua Rodgers, male   DOB: [birth year], 81 y.o.   MRN: [id number]   Braulio Bosch 02/13/2014, 3:34 PM

## 2014-02-13 NOTE — Evaluation (Signed)
Physical Therapy Evaluation Patient Details Name: Joshua Rodgers MRN: [id number] DOB: Jan 23, 1923 Today's Date: 02/13/2014   History of Present Illness  pt presents with Sepsis and UTI with chronic foley.    Clinical Impression  Pt requires total A for all aspects of mobility.  Pt's wife arrived at end of session and had lengthy conversation with wife about her wishes and expectations for pt.  Wife states that pt has not stood in ~4 months, however she thinks if he just had braces for his feet he could walk again.  Attempted to discuss pt's need for increased level of care and likelihood of pt's ability to return to ambulating at this point, however wife adamant that pt will return to home and she will continue to provide care for him.  Wife indicates pt is working with [employer] to get "braces" for his shoes, however discussed with wife pt's inability to achieve neutral positioning with his ankles will limit his ability to stand, ambulate, or even wear braces in his shoes.  Feel if wife continues to insist on taking pt home, he would benefit from [facility] services and would even consider Home SW.  Will trial PT while on acute.      Follow Up Recommendations SNF    Equipment Recommendations  None recommended by PT    Recommendations for Other Services       Precautions / Restrictions Precautions Precautions: Fall Precaution Comments: pt's wife indicates he has some "braces" for his feet, but didn't bring them.  sounds like PRAFOs.  encouraged wife to use them.   Restrictions Weight Bearing Restrictions: No      Mobility  Bed Mobility Overal bed mobility: Needs Assistance Bed Mobility: Supine to Sit;Sit to Supine     Supine to sit: Total assist;HOB elevated Sit to supine: Total assist;HOB elevated   General bed mobility comments: pt attempts to A with movement of Bil LEs towards EOB, however minimal strength to A with mobility.    Transfers                     Ambulation/Gait                Stairs            Wheelchair Mobility    Modified Rankin (Stroke Patients Only)       Balance Overall balance assessment: Needs assistance Sitting-balance support: Bilateral upper extremity supported (L foot supported, R LE unable to touch floor.  ) Sitting balance-Leahy Scale: Zero Sitting balance - Comments: pt needs consistent A to maintain sitting positioning and is unable to A with maintaining balance                                     Pertinent Vitals/Pain Pain Assessment: No/denies pain    Home Living Family/patient expects to be discharged to:: Private residence Living Arrangements: Spouse/significant other Available Help at Discharge: Family;Available 24 hours/day Type of Home: House Home Access: Ramped entrance     Home Layout: One level Home Equipment: Hospital bed;Wheelchair - [employer] - 2 wheels Additional Comments: [employer] streamer    Prior Function Level of Independence: Needs assistance   Gait / Transfers Assistance Needed: pt non-ambulatory and uses a lift for OOB.    ADL's / Homemaking Assistance Needed: total (A)  Comments: pt has an aide that comes 2hrs per day.  Hand Dominance   Dominant Hand: Right    Extremity/Trunk Assessment   Upper Extremity Assessment: Defer to OT evaluation           Lower Extremity Assessment: Generalized weakness;RLE deficits/detail;LLE deficits/detail RLE Deficits / Details: pt with very limtied ROM at ankles, knees, and hips.  ankles unable to achieve neutral positioning and remain plantar flexed and inverted.  pt only able to flex knees to ~75 degrees.   LLE Deficits / Details: pt with very limited ROM at ankle, knee, and hip.  Ankles unable to achieve neutral position and remain plantar flexed and inverted.  pt only able to flex knee to ~80 degrees.    Cervical / Trunk Assessment: Normal  Communication   Communication: No difficulties   Cognition Arousal/Alertness: Awake/alert Behavior During Therapy: WFL for tasks assessed/performed Overall Cognitive Status: History of cognitive impairments - at baseline                      General Comments      Exercises        Assessment/Plan    PT Assessment Patient needs continued PT services  PT Diagnosis Generalized weakness   PT Problem List Decreased strength;Decreased range of motion;Decreased activity tolerance;Decreased balance;Decreased mobility;Decreased coordination;Decreased cognition;Decreased knowledge of use of DME  PT Treatment Interventions Functional mobility training;Therapeutic activities;Therapeutic exercise;Balance training;Patient/family education   PT Goals (Current goals can be found in the Care Plan section) Acute Rehab PT Goals Patient Stated Goal: Per wife for pt to walk again.   PT Goal Formulation: With family Time For Goal Achievement: 02/27/14 Potential to Achieve Goals: Poor    Frequency Min 2X/week   Barriers to discharge Decreased caregiver support Lives with his wife and has an aide for only 2hrs per day.      Co-evaluation               End of Session   Activity Tolerance: Patient limited by fatigue Patient left: in bed;with call bell/phone within reach;with bed alarm set;with family/visitor present Nurse Communication: Mobility status;Need for lift equipment         Time: 1030-1058 PT Time Calculation (min) (ACUTE ONLY): 28 min   Charges:   PT Evaluation $Initial PT Evaluation Tier I: 1 Procedure     PT G CodesCatarina Hartshorn, [place] 02/13/2014, 12:27 PM

## 2014-02-14 LAB — BASIC METABOLIC PANEL
Anion gap: 5 (ref 5–15)
Anion gap: 5 (ref 5–15)
Anion gap: 7 (ref 5–15)
BUN: 15 mg/dL (ref 6–23)
BUN: 13 mg/dL (ref 6–23)
BUN: 12 mg/dL (ref 6–23)
CALCIUM: 8 mg/dL — AB (ref 8.4–10.5)
CALCIUM: 8.4 mg/dL (ref 8.4–10.5)
CHLORIDE: 108 mmol/L (ref 96–112)
CO2: 26 mmol/L (ref 19–32)
CO2: 29 mmol/L (ref 19–32)
CO2: 28 mmol/L (ref 19–32)
CREATININE: 1.09 mg/dL (ref 0.50–1.35)
Calcium: 8.4 mg/dL (ref 8.4–10.5)
Chloride: 110 mmol/L (ref 96–112)
Chloride: 107 mmol/L (ref 96–112)
Creatinine, Ser: 1.16 mg/dL (ref 0.50–1.35)
Creatinine, Ser: 1.05 mg/dL (ref 0.50–1.35)
GFR calc Af Amer: 63 mL/min — ABNORMAL LOW (ref >90)
GFR calc Af Amer: 71 mL/min — ABNORMAL LOW (ref >90)
GFR calc non Af Amer: 54 mL/min — ABNORMAL LOW (ref >90)
GFR calc non Af Amer: 61 mL/min — ABNORMAL LOW (ref >90)
GFR, EST AFRICAN AMERICAN: 68 mL/min — AB (ref >90)
GFR, EST NON AFRICAN AMERICAN: 59 mL/min — AB (ref >90)
GLUCOSE: 110 mg/dL — AB (ref 70–99)
Glucose, Bld: 100 mg/dL — ABNORMAL HIGH (ref 70–99)
Glucose, Bld: 103 mg/dL — ABNORMAL HIGH (ref 70–99)
POTASSIUM: 2.9 mmol/L — AB (ref 3.5–5.1)
POTASSIUM: 2.6 mmol/L — AB (ref 3.5–5.1)
Potassium: 3.1 mmol/L — ABNORMAL LOW (ref 3.5–5.1)
SODIUM: 141 mmol/L (ref 135–145)
SODIUM: 142 mmol/L (ref 135–145)
Sodium: 142 mmol/L (ref 135–145)

## 2014-02-14 LAB — CBC
HCT: 29.3 % — ABNORMAL LOW (ref 39.0–52.0)
Hemoglobin: 9.5 g/dL — ABNORMAL LOW (ref 13.0–17.0)
MCH: 29.2 pg (ref 26.0–34.0)
MCHC: 32.4 g/dL (ref 30.0–36.0)
MCV: 90.2 fL (ref 78.0–100.0)
Platelets: 145 10*3/uL — ABNORMAL LOW (ref 150–400)
RBC: 3.25 MIL/uL — AB (ref 4.22–5.81)
RDW: 14.9 % (ref 11.5–15.5)
WBC: 4.4 10*3/uL (ref 4.0–10.5)

## 2014-02-14 LAB — HEMOGLOBIN AND HEMATOCRIT, BLOOD
HCT: 32.3 % — ABNORMAL LOW (ref 39.0–52.0)
Hemoglobin: 10.5 g/dL — ABNORMAL LOW (ref 13.0–17.0)

## 2014-02-14 LAB — MAGNESIUM: Magnesium: 1.9 mg/dL (ref 1.5–2.5)

## 2014-02-14 LAB — PROTIME-INR
INR: 1.98 — AB (ref 0.00–1.49)
Prothrombin Time: 22.7 seconds — ABNORMAL HIGH (ref 11.6–15.2)

## 2014-02-14 MED ORDER — POTASSIUM CHLORIDE CRYS ER 20 MEQ PO TBCR
40.0000 meq | EXTENDED_RELEASE_TABLET | Freq: Once | ORAL | Status: AC
  Administered 2014-02-14: 40 meq via ORAL
  Filled 2014-02-14: qty 2

## 2014-02-14 MED ORDER — AMILORIDE HCL 5 MG PO TABS
10.0000 mg | ORAL_TABLET | Freq: Two times a day (BID) | ORAL | Status: DC
  Administered 2014-02-14 – 2014-02-15 (×2): 10 mg via ORAL
  Filled 2014-02-14 (×3): qty 2

## 2014-02-14 MED ORDER — WARFARIN SODIUM 2.5 MG PO TABS
2.5000 mg | ORAL_TABLET | Freq: Every day | ORAL | Status: DC
  Administered 2014-02-14: 2.5 mg via ORAL
  Filled 2014-02-14 (×2): qty 1

## 2014-02-14 MED ORDER — POTASSIUM CHLORIDE 10 MEQ/100ML IV SOLN
10.0000 meq | INTRAVENOUS | Status: AC
  Administered 2014-02-14 (×4): 10 meq via INTRAVENOUS
  Filled 2014-02-14 (×4): qty 100

## 2014-02-14 MED ORDER — POTASSIUM CHLORIDE CRYS ER 20 MEQ PO TBCR: 40.0000 meq | EXTENDED_RELEASE_TABLET | Freq: Once | ORAL | Status: DC

## 2014-02-14 MED ORDER — POTASSIUM CHLORIDE 10 MEQ/100ML IV SOLN: 10.0000 meq | INTRAVENOUS | Status: DC

## 2014-02-14 NOTE — Progress Notes (Signed)
ANTICOAGULATION CONSULT NOTE - Follow Up Consult  Pharmacy Consult for Coumadin Indication: DVT history  Allergies  Allergen Reactions  . Contrast Media [Iodinated Diagnostic Agents]     Pt blacked out  . Iohexol      Desc: sob, throat swelling (1988 gdc) pt requires full premeds and does well, JB 8/14/7     Labs:  Recent Labs  02/11/14 1715 02/11/14 2038  02/12/14 0300 02/13/14 0115 02/14/14 0252 02/14/14 0937  HGB  --   --   < > 10.5* 10.0* 9.5* 10.5*  HCT  --   --   < > 32.5* 30.8* 29.3* 32.3*  PLT  --   --   --  163 153 145*  --   LABPROT  --   --   --  24.2* 25.5* 22.7*  --   INR  --   --   --  2.15* 2.30* 1.98*  --   CREATININE  --   --   < > 1.17 1.38* 1.16 1.09  TROPONINI 0.24* 0.17*  --  0.15*  --   --   --   < > = values in this interval not displayed.  Estimated Creatinine Clearance: 49.9 mL/min (by C-G formula based on Cr of 1.09).   Assessment: 81 year old male admitted with abdominal pain and fever on Coumadin PTA for DVT history Admit INR low at 1.81, now therapeutic at 1.98  Goal of Therapy:  INR 2-3 Monitor platelets by anticoagulation protocol: Yes   Plan:  Increase Coumadin to  2.5 mg po daily at 1800 pm Daily INR for now  Thank you. Anette Guarneri, PharmD [phone]  02/14/2014,12:00 PM

## 2014-02-14 NOTE — Progress Notes (Signed)
[facility] TEAM 1 - Stepdown/ICU TEAM Progress Note  Joshua Rodgers GEX:[id number] DOB: June 30, 1923 DOA: 02/11/2014 PCP: Kandice Hams, MD  Admit HPI / Brief Narrative: 81 y.o. male with PMH of  dementia, paroxysmal atrial fibrillation, DVT on Coumadin, prostate cancer with chronic Foley catheter in place who presented today with worsening lower abdominal pain and fever.  At baseline, pt with bilateral foot drop and is nonambulatory due to severe spinal stenosis and has been bed to wheelchair bound for the past 2 years. Patient's spouse also claimed he had 4-5 loose stools. Because of worsening pain, patient was brought to the emergency room. In the ED, pt was found to have atrial fibrillation with RVR, and UA with evidence of  UTI. Indwerlling catheter was changed in ED, pt was placed on empiric IV antibiotics, and admitted for further management.  HPI/Subjective: Confused (at baseline mental status per wife) and therefore unable to provide a reliable history.    Assessment/Plan:  Sepsis secondary to gram negative rods UTI -sepsis physiology has resolved  -transitionted to oral Ceftin -PT with recs for SNF, wife insists on taking home home, states has home PT  Diarrhea -Resolved, likely secondary to UTI  Chronic indwelling foley catheter -Catheter changed in ED  Chronic afib with acute RVR -HR controlled on BB -Pt with history of paroxysmal afib - he developed an episode of afib with RVR, and was placed on Metoprolol -continue Metoprolol w/ care as pt had observed bradycardia earlier in hospital stay   Chronic Diastolic CHF -Appears Euvolemic -2D echo 06/2013 with EF 01-02%, grade I diastolic dysfunction.   -Continue Amiloride 10mg  BID  Hypokalemia -K 3.1 but has dipped as low as 2.5 and has proven refractory to replacement thus far  -Repeleted with IV K and KDUR -repeat BMET in am  Hypomagnesemia -Mg normal - Repleted with IV mag  Mildly Elevated Troponin -Denies any  chest pain- likely secondary to sepsis.   Lumbar Spinal Stenosis/ Bilateral Foot Drop -bilateral foot drop braces cont   HTN -BP fluctuating, but pt having episodes of agitation - follow w/o change for now   Acute toxic metabolic encephalopathy in setting of dementia -Per wife, pt is back to baseline mental status  DVT  -No complains -Continue Coumadin     Depression -stable. Continue Celexa 30 mg daily   Normocytic anemia  Hgb stable. Continue iron tablets daily  GERD Continue Protonix 40 mg daily  BPH Continue Tamsulosin 0.4 mg daily   Code Status: FULL Family Communication: spoke w/ wife at bedside  Disposition Plan: SDU, d/c in am if K stable  Consultants: None  Procedure/Significant Events: None  Antibiotics: Cefepime 1/31 > 2/01 Zosyn 1/31 Vanc 1/31 Rocephin 2/1 Ceftin 2/2 >  DVT prophylaxis: Coumadin per pharmacy  Objective: VITAL SIGNS: Temp: 97 F (36.1 C) (02/03 1649) Temp Source: Oral (02/03 1649) BP: 143/88 mmHg (02/03 1159) Pulse Rate: 94 (02/03 1159)  Intake/Output Summary (Last 24 hours) at 02/14/14 1729 Last data filed at 02/14/14 1649  Gross per 24 hour  Intake 2036.34 ml  Output   1275 ml  Net 761.34 ml   Exam: General: confused AA male in NAD Lungs: Clear to auscultation bilaterally without wheezes or crackles Cardiovascular: Regular rate and rhythm without murmur gallop or rub normal S1 and S2 Abdomen: Nontender, nondistended, soft, bowel sounds positive, no rebound, no ascites, no appreciable mass Extremities: No significant cyanosis, clubbing, or edema  Data Reviewed: Basic Metabolic Panel:  Recent Labs Lab 02/12/14 0300 02/13/14  0115 02/13/14 1350 02/14/14 0252 02/14/14 0937 02/14/14 1620  NA 144 142  --  141 142 142  K 3.5 2.8* 2.5* 2.9* 2.6* 3.1*  CL 110 112  --  110 108 107  CO2 26 24  --  26 29 28   GLUCOSE 92 107*  --  100* 110* 103*  BUN 15 19  --  15 13 12   CREATININE 1.17 1.38*  --  1.16 1.09 1.05    CALCIUM 8.9 8.1*  --  8.0* 8.4 8.4  MG  --   --  1.4* 1.9  --   --    Liver Function Tests:  Recent Labs Lab 02/11/14 1009 02/13/14 0115  AST 24 17  ALT 9 9  ALKPHOS 51 39  BILITOT 0.6 0.3  PROT 6.4 4.9*  ALBUMIN 3.7 3.0*   CBC:  Recent Labs Lab 02/11/14 1009 02/12/14 0300 02/13/14 0115 02/14/14 0252 02/14/14 0937  WBC 9.7 5.1 4.0 4.4  --   NEUTROABS 8.4*  --   --   --   --   HGB 12.0* 10.5* 10.0* 9.5* 10.5*  HCT 36.8* 32.5* 30.8* 29.3* 32.3*  MCV 91.5 92.1 92.2 90.2  --   PLT 208 163 153 145*  --    Cardiac Enzymes:  Recent Labs Lab 02/11/14 1009 02/11/14 1715 02/11/14 2038 02/12/14 0300  TROPONINI 0.16* 0.24* 0.17* 0.15*    Recent Results (from the past 240 hour(s))  Blood Culture (routine x 2)     Status: None (Preliminary result)   Collection Time: 02/11/14  9:59 AM  Result Value Ref Range Status   Specimen Description BLOOD RIGHT FOREARM  Final   Special Requests BOTTLES DRAWN AEROBIC AND ANAEROBIC 5CC  Final   Culture   Final           BLOOD CULTURE RECEIVED NO GROWTH TO DATE CULTURE WILL BE HELD FOR 5 DAYS BEFORE ISSUING A FINAL NEGATIVE REPORT Performed at [facility]    Report Status PENDING  Incomplete  Urine culture     Status: None (Preliminary result)   Collection Time: 02/11/14 10:08 AM  Result Value Ref Range Status   Specimen Description URINE, CATHETERIZED  Final   Special Requests NONE  Final   Colony Count   Final    >=100,000 COLONIES/ML Performed at [facility]    Culture   Final    [place] Performed at [facility]    Report Status PENDING  Incomplete  Blood Culture (routine x 2)     Status: None (Preliminary result)   Collection Time: 02/11/14 10:53 AM  Result Value Ref Range Status   Specimen Description BLOOD RIGHT HAND  Final   Special Requests BOTTLES DRAWN AEROBIC AND ANAEROBIC 10CC  Final   Culture   Final           BLOOD CULTURE RECEIVED NO GROWTH TO DATE CULTURE WILL BE HELD  FOR 5 DAYS BEFORE ISSUING A FINAL NEGATIVE REPORT Performed at [facility]    Report Status PENDING  Incomplete  MRSA PCR Screening     Status: None   Collection Time: 02/11/14  2:40 PM  Result Value Ref Range Status   MRSA by PCR NEGATIVE NEGATIVE Final    Comment:        The GeneXpert MRSA Assay (FDA approved for NASAL specimens only), is one component of a comprehensive MRSA colonization surveillance program. It is not intended to diagnose MRSA infection nor to guide or  monitor treatment for MRSA infections.      Studies:  Recent x-ray studies have been reviewed in detail by the Attending Physician  Scheduled Meds:  Scheduled Meds: . cefUROXime  250 mg Oral BID WC  . citalopram  20 mg Oral Daily  . ferrous sulfate  325 mg Oral Daily  . metoprolol tartrate  12.5 mg Oral BID  . pantoprazole  40 mg Oral Daily  . potassium chloride  40 mEq Oral Once  . sodium chloride  3 mL Intravenous Q12H  . tamsulosin  0.8 mg Oral QHS  . warfarin  2.5 mg Oral q1800  . Warfarin - Pharmacist Dosing Inpatient   Does not apply q1800    Time spent on care of this patient: 35 mins   Lacy Duverney , [facility]  [facility] Office  [phone] Pager - [phone]  On-Call/Text Page:      [email or web address]      password [email or web address]  If 7PM-7AM, please contact night-coverage [email or web address] Password [email or web address] 02/14/2014, 5:29 PM   LOS: 3 days   I have personally examined this patient and reviewed the entire database. I have reviewed the above note, made any necessary editorial changes, and agree with its content.  Cherene Altes, MD [facility]

## 2014-02-14 NOTE — Discharge Summary (Deleted)
Physician Discharge Summary  Joshua Rodgers:[id number] DOB: [birth year] DOA: 02/11/2014  PCP: Kandice Hams, MD  Admit date: 02/11/2014 Discharge date: 02/15/2014  Time spent: 45 minutes  Recommendations for Outpatient Follow-up:  1. Follow up with PCP for INR recheck on Friday 02/16/14, and for BP medication management. 2. Discharge home with HHPT. PT recommends discharge to SNF, wife refusing and insists on taking him home with home  health PT    Discharge Diagnoses:  Principal Problem:   Sepsis Active Problems:   Lumbar stenosis   HTN (hypertension)   Bladder, atonic   History of DVT (deep vein thrombosis)   Constipation   Toxic encephalopathy   UTI (lower urinary tract infection)   Chronic diastolic CHF (congestive heart failure)   Atrial fibrillation with RVR   Elevated troponin   Sepsis secondary to UTI   Discharge Condition: Stable  Diet recommendation: Heart healthy  Filed Weights   02/13/14 1504 02/14/14 1502 02/15/14 0327  Weight: 80.8 kg (178 lb 2.1 oz) 84.8 kg (186 lb 15.2 oz) 82.9 kg (182 lb 12.2 oz)    History of present illness:  81 y.o. male with PMH of  dementia, paroxysmal atrial fibrillation, DVT on Coumadin, HTN, prostate cancer with chronic Foley catheter in place who presented to ED on 1/31 with worsening lower abdominal pain and fever.  At baseline, pt with bilateral foot drop and is nonambulatory due to severe spinal stenosis and has been bed to wheelchair bound for the past 2 years. In the ED, pt was found to have atrial fibrillation with RVR, and UA with evidence of  UTI. Indwelling catheter was changed in ED, pt was placed on empiric IV antibiotics, and admitted for further management. IV antibiotics were modified according to cultures, and he was transitioned to oral Ceftin, which he will need to continue on discharge. Also, pt with episode of hypokalemia, which has resolved on discharge.    Hospital Course:   Sepsis secondary to Proteus  Mirabilis and Ecoli UTI -Patient presented with sepsis secondary to UTI from clogged chronic indwelling foley catheter. He was placed on empiric antibiotic, which were subsequently changed to Rocephin, after growth of gram negative rods on urine culture.  He was then transitioned to oral Ceftin. Organisms sensitive to Ciprofloxacin -On discharge, continue Ciprofloxacin 500mg  PO BID for 12 days.   Diarrhea -Resolved, likely secondary to UTI  Chronic indwelling foley catheter -Catheter well draining on discharge. Was changed at presentation to ER  Acute toxic metabolic encephalopathy in setting of dementia -Per wife, pt is back to baseline mental status  Chronic afib with acute RVR -Pt with history of paroxysmal afib.  He developed an episode of afib with RVR, and was placed on Metoprolol. -On discharge continue metoprolol 12.5mg  BID  HTN -BP permissive on discharge. Placed on Metoprolol 12.5mg  BID. Discontinue Norvasc 10mg  daily.  -Follow up with PCP for BP medications adjustment.  DVT  -No complains this hospitalization, continue chronic Coumadin   -Follow up with PCP for INR recheck on Friday 02/16/14  Elevated Troponin -Trop were elevated but suspected secondary to sepsis, pt denied any chest pain.  Chronic Diastolic CHF -Appears Euvolemic on discharge.  2D echo 06/2013 with EF 82-99%, grade I diastolic dysfunction.  -Continue Amiloride 10mg  BID  Hypokalemia -K normal on discharge. Repleted with oral/IV potassium.  -Continue Kdur 6meq daily  Hypomagnesemia -Mg normal on discharge. Repleted with IV mag   HTN -BP stable on discharge -STOP TAKING Norvasc  -Placed on Metoprolol 12.5mg   BID -follow up with PCP for adjustment of BP meds  Lumbar Spinal Stenosis/Bilateral Foot Drop -With bilateral brace   Dementia -stable, per wife, pt is at baseline, alert, no oriented.  Depression -stable. Continue Celexa 30 mg daily   Normocytic anemia  Hgb . Continue iron tablets  daily  GERD Continue Protonix 40 mg daily  BPH Continue Tamsulosin 0.4 mg daily  Procedures:  None  Consultations:  None  Discharge Exam: Filed Vitals:   02/15/14 1100  BP: 172/81  Pulse: 87  Temp: 98.1 F (36.7 C)  Resp: 22     Exam General: Pleasant demented AA male in NAD. Cardiovascular: Regular rate and rhythm.  No murmurs, rubs, or gallops. Respiratory: Clear to auscultate bilaterally.  No rhonchi or crepitations. Abdomen: Soft nontender bowel sounds present. No guarding or rigidity Musculoskeletal: No edema. Bilateral brace placed  Discharge Instructions    Medication List    STOP taking these medications        amLODipine 10 MG tablet  Commonly known as:  NORVASC      TAKE these medications        acetaminophen 500 MG tablet  Commonly known as:  TYLENOL  Take 1,000 mg by mouth every 6 (six) hours as needed for moderate pain, fever or headache.     ALPRAZolam 0.5 MG tablet  Commonly known as:  XANAX  Take 0.5 mg by mouth 3 (three) times daily as needed for anxiety. For anxiety.     aMILoride 5 MG tablet  Commonly known as:  MIDAMOR  Take 10 mg by mouth 2 (two) times daily.     CALTRATE 600 PLUS-VIT D PO  Take 1 tablet by mouth daily.     cholecalciferol 1000 UNITS tablet  Commonly known as:  VITAMIN D  Take 1,000 Units by mouth daily.     ciprofloxacin 500 MG tablet  Commonly known as:  CIPRO  Take 1 tablet (500 mg total) by mouth 2 (two) times daily.     citalopram 20 MG tablet  Commonly known as:  CELEXA  Take 20 mg by mouth daily.     DSS 100 MG Caps  Take 100 mg by mouth 2 (two) times daily.     ferrous sulfate 324 (65 FE) MG Tbec  Take 1 tablet by mouth daily.     GLUCOSAMINE CHONDR 1500 COMPLX Caps  Take 1 capsule by mouth daily.     memantine 28 MG Cp24 24 hr capsule  Commonly known as:  NAMENDA XR  Take 28 mg by mouth daily.     metoprolol tartrate 25 MG tablet  Commonly known as:  LOPRESSOR  Take 0.5 tablets  (12.5 mg total) by mouth 2 (two) times daily.     pantoprazole 40 MG tablet  Commonly known as:  PROTONIX  Take 40 mg by mouth daily.     potassium chloride SA 20 MEQ tablet  Commonly known as:  K-DUR,KLOR-CON  Take 20 mEq by mouth 2 (two) times daily.     tamsulosin 0.4 MG Caps capsule  Commonly known as:  FLOMAX  Take 0.8 mg by mouth at bedtime.     VITAMIN B 12 PO  Take 1,000 mg by mouth daily.     warfarin 2 MG tablet  Commonly known as:  COUMADIN  Take 2 mg by mouth every morning.       Allergies  Allergen Reactions  . Contrast Media [Iodinated Diagnostic Agents]     Pt blacked out  .  Iohexol      Desc: sob, throat swelling (1988 gdc) pt requires full premeds and does well, JB 8/14/7    Follow-up Information    Follow up with Kandice Hams, MD.   Specialty:  Internal Medicine   Contact information:   [address] [place] [address] [phone]        The results of significant diagnostics from this hospitalization (including imaging, microbiology, ancillary and laboratory) are listed below for reference.    Significant Diagnostic Studies: Dg Chest Port 1 View  (if Code Sepsis Called)  02/11/2014   CLINICAL DATA:  CODE SEPSIS; ATRIAL FIBRILLATION - Pt from home via [facility] with c/o shaking and fidgeting overnight with fever and decreased urine output. Pt reported lower mid abdominal pain and found to be a-fib  EXAM: PORTABLE CHEST - 1 VIEW  COMPARISON:  06/19/2013  FINDINGS: Cardiac silhouette is mildly enlarged. Aorta is uncoiled and mildly tortuous. No mediastinal or hilar masses or evidence of adenopathy. Clear lungs. No pleural effusion or pneumothorax.  Bony thorax is grossly intact.  IMPRESSION: No acute cardiopulmonary disease.   Electronically Signed   By: Lajean Manes M.D.   On: 02/11/2014 11:04    Microbiology: Recent Results (from the past 240 hour(s))  Blood Culture (routine x 2)     Status: None (Preliminary result)    Collection Time: 02/11/14  9:59 AM  Result Value Ref Range Status   Specimen Description BLOOD RIGHT FOREARM  Final   Special Requests BOTTLES DRAWN AEROBIC AND ANAEROBIC 5CC  Final   Culture   Final           BLOOD CULTURE RECEIVED NO GROWTH TO DATE CULTURE WILL BE HELD FOR 5 DAYS BEFORE ISSUING A FINAL NEGATIVE REPORT Performed at [facility]    Report Status PENDING  Incomplete  Urine culture     Status: None   Collection Time: 02/11/14 10:08 AM  Result Value Ref Range Status   Specimen Description URINE, CATHETERIZED  Final   Special Requests NONE  Final   Colony Count   Final    >=100,000 COLONIES/ML Performed at [facility]    Culture   Final    [place] Performed at [facility]    Report Status 02/15/2014 FINAL  Final   Organism ID, Bacteria ESCHERICHIA COLI  Final   Organism ID, Bacteria PROTEUS MIRABILIS  Final      Susceptibility   Escherichia coli - MIC*    AMPICILLIN 8 SENSITIVE Sensitive     CEFAZOLIN <=4 SENSITIVE Sensitive     CEFTRIAXONE <=1 SENSITIVE Sensitive     CIPROFLOXACIN <=0.25 SENSITIVE Sensitive     GENTAMICIN <=1 SENSITIVE Sensitive     LEVOFLOXACIN <=0.12 SENSITIVE Sensitive     NITROFURANTOIN <=16 SENSITIVE Sensitive     TOBRAMYCIN <=1 SENSITIVE Sensitive     TRIMETH/SULFA <=20 SENSITIVE Sensitive     PIP/TAZO <=4 SENSITIVE Sensitive     * ESCHERICHIA COLI   Proteus mirabilis - MIC*    AMPICILLIN >=32 RESISTANT Resistant     CEFAZOLIN <=4 SENSITIVE Sensitive     CEFTRIAXONE <=1 SENSITIVE Sensitive     CIPROFLOXACIN <=0.25 SENSITIVE Sensitive     GENTAMICIN <=1 SENSITIVE Sensitive     LEVOFLOXACIN <=0.12 SENSITIVE Sensitive     NITROFURANTOIN RESISTANT      TOBRAMYCIN <=1 SENSITIVE Sensitive     TRIMETH/SULFA >=320 RESISTANT Resistant     PIP/TAZO 8 SENSITIVE Sensitive     *  PROTEUS MIRABILIS  Blood Culture (routine x 2)     Status: None (Preliminary result)   Collection Time: 02/11/14  10:53 AM  Result Value Ref Range Status   Specimen Description BLOOD RIGHT HAND  Final   Special Requests BOTTLES DRAWN AEROBIC AND ANAEROBIC 10CC  Final   Culture   Final           BLOOD CULTURE RECEIVED NO GROWTH TO DATE CULTURE WILL BE HELD FOR 5 DAYS BEFORE ISSUING A FINAL NEGATIVE REPORT Performed at [facility]    Report Status PENDING  Incomplete  MRSA PCR Screening     Status: None   Collection Time: 02/11/14  2:40 PM  Result Value Ref Range Status   MRSA by PCR NEGATIVE NEGATIVE Final    Comment:        The GeneXpert MRSA Assay (FDA approved for NASAL specimens only), is one component of a comprehensive MRSA colonization surveillance program. It is not intended to diagnose MRSA infection nor to guide or monitor treatment for MRSA infections.      Labs: Basic Metabolic Panel:  Recent Labs Lab 02/13/14 0115 02/13/14 1350 02/14/14 0252 02/14/14 0937 02/14/14 1620 02/15/14 0313  NA 142  --  141 142 142 142  K 2.8* 2.5* 2.9* 2.6* 3.1* 3.6  CL 112  --  110 108 107 111  CO2 24  --  26 29 28 26   GLUCOSE 107*  --  100* 110* 103* 101*  BUN 19  --  15 13 12 14   CREATININE 1.38*  --  1.16 1.09 1.05 1.11  CALCIUM 8.1*  --  8.0* 8.4 8.4 8.2*  MG  --  1.4* 1.9  --   --  1.7   Liver Function Tests:  Recent Labs Lab 02/11/14 1009 02/13/14 0115  AST 24 17  ALT 9 9  ALKPHOS 51 39  BILITOT 0.6 0.3  PROT 6.4 4.9*  ALBUMIN 3.7 3.0*   No results for input(s): LIPASE, AMYLASE in the last 168 hours. No results for input(s): AMMONIA in the last 168 hours. CBC:  Recent Labs Lab 02/11/14 1009 02/12/14 0300 02/13/14 0115 02/14/14 0252 02/14/14 0937 02/15/14 0313  WBC 9.7 5.1 4.0 4.4  --  4.1  NEUTROABS 8.4*  --   --   --   --   --   HGB 12.0* 10.5* 10.0* 9.5* 10.5* 9.7*  HCT 36.8* 32.5* 30.8* 29.3* 32.3* 29.7*  MCV 91.5 92.1 92.2 90.2  --  90.3  PLT 208 163 153 145*  --  150   Cardiac Enzymes:  Recent Labs Lab 02/11/14 1009 02/11/14 1715  02/11/14 2038 02/12/14 0300  TROPONINI 0.16* 0.24* 0.17* 0.15*   BNP: BNP (last 3 results) No results for input(s): BNP in the last 8760 hours.  ProBNP (last 3 results) No results for input(s): PROBNP in the last 8760 hours.  CBG: No results for input(s): GLUCAP in the last 168 hours.     SignedLacy Duverney PA-C  [phone] [facility] 02/15/2014, 2:42 PM

## 2014-02-14 NOTE — Significant Event (Signed)
CRITICAL VALUE ALERT  Critical value received:  Potassium 2.6  Date of notification:  02/14/14  Time of notification:  4239  Critical value read back: yes  Nurse who received alert:  Guy Begin RN  MD notified (1st page):  Alene Mires PA  Time of first page:  1  MD notified (2nd page):  Time of second page:  Responding MD:  Alene Mires PA  Time MD responded:  1131. Orders for PO and IV potassium

## 2014-02-14 NOTE — Progress Notes (Signed)
Medicare Important Message given? YES  (If response is "NO", the following Medicare IM given date fields will be blank)  Date Medicare IM given: 02/14/14 Medicare IM given by:  Dahlia Client [employer]

## 2014-02-15 DIAGNOSIS — I82409 Acute embolism and thrombosis of unspecified deep veins of unspecified lower extremity: Secondary | ICD-10-CM | POA: Insufficient documentation

## 2014-02-15 DIAGNOSIS — O10019 Pre-existing essential hypertension complicating pregnancy, unspecified trimester: Secondary | ICD-10-CM | POA: Insufficient documentation

## 2014-02-15 DIAGNOSIS — I482 Chronic atrial fibrillation, unspecified: Secondary | ICD-10-CM | POA: Insufficient documentation

## 2014-02-15 DIAGNOSIS — N400 Enlarged prostate without lower urinary tract symptoms: Secondary | ICD-10-CM | POA: Insufficient documentation

## 2014-02-15 DIAGNOSIS — Z9889 Other specified postprocedural states: Secondary | ICD-10-CM

## 2014-02-15 DIAGNOSIS — D649 Anemia, unspecified: Secondary | ICD-10-CM | POA: Insufficient documentation

## 2014-02-15 DIAGNOSIS — Z960 Presence of urogenital implants: Secondary | ICD-10-CM

## 2014-02-15 DIAGNOSIS — Z978 Presence of other specified devices: Secondary | ICD-10-CM | POA: Insufficient documentation

## 2014-02-15 DIAGNOSIS — A4151 Sepsis due to Escherichia coli [E. coli]: Secondary | ICD-10-CM

## 2014-02-15 DIAGNOSIS — R197 Diarrhea, unspecified: Secondary | ICD-10-CM | POA: Insufficient documentation

## 2014-02-15 LAB — URINE CULTURE

## 2014-02-15 LAB — CBC
HCT: 29.7 % — ABNORMAL LOW (ref 39.0–52.0)
Hemoglobin: 9.7 g/dL — ABNORMAL LOW (ref 13.0–17.0)
MCH: 29.5 pg (ref 26.0–34.0)
MCHC: 32.7 g/dL (ref 30.0–36.0)
MCV: 90.3 fL (ref 78.0–100.0)
Platelets: 150 10*3/uL (ref 150–400)
RBC: 3.29 MIL/uL — ABNORMAL LOW (ref 4.22–5.81)
RDW: 15 % (ref 11.5–15.5)
WBC: 4.1 10*3/uL (ref 4.0–10.5)

## 2014-02-15 LAB — BASIC METABOLIC PANEL
ANION GAP: 5 (ref 5–15)
BUN: 14 mg/dL (ref 6–23)
CHLORIDE: 111 mmol/L (ref 96–112)
CO2: 26 mmol/L (ref 19–32)
CREATININE: 1.11 mg/dL (ref 0.50–1.35)
Calcium: 8.2 mg/dL — ABNORMAL LOW (ref 8.4–10.5)
GFR calc Af Amer: 66 mL/min — ABNORMAL LOW (ref >90)
GFR, EST NON AFRICAN AMERICAN: 57 mL/min — AB (ref >90)
Glucose, Bld: 101 mg/dL — ABNORMAL HIGH (ref 70–99)
Potassium: 3.6 mmol/L (ref 3.5–5.1)
Sodium: 142 mmol/L (ref 135–145)

## 2014-02-15 LAB — PROTIME-INR
INR: 1.9 — AB (ref 0.00–1.49)
Prothrombin Time: 22 seconds — ABNORMAL HIGH (ref 11.6–15.2)

## 2014-02-15 LAB — MAGNESIUM: Magnesium: 1.7 mg/dL (ref 1.5–2.5)

## 2014-02-15 MED ORDER — CEFUROXIME AXETIL 250 MG PO TABS: 250.0000 mg | ORAL_TABLET | Freq: Two times a day (BID) | ORAL | Status: DC

## 2014-02-15 MED ORDER — CIPROFLOXACIN HCL 500 MG PO TABS: 500.0000 mg | ORAL_TABLET | Freq: Two times a day (BID) | ORAL | Status: AC

## 2014-02-15 MED ORDER — METOPROLOL TARTRATE 25 MG PO TABS: 12.5000 mg | ORAL_TABLET | Freq: Two times a day (BID) | ORAL | Status: DC

## 2014-02-15 NOTE — Progress Notes (Signed)
Discharge instructions given. No questions or concerns at this time. D/C'd IVs. Tips intact. Pt tolerated well. Pt d/c'd via family vehicle.

## 2014-02-15 NOTE — Care Management Note (Signed)
    Page 1 of 2   02/15/2014     3:21:13 PM CARE MANAGEMENT NOTE 02/15/2014  Patient:  Joshua Rodgers, Joshua Rodgers   Account Number:  [id number]  Date Initiated:  02/12/2014  Documentation initiated by:  Marvetta Gibbons  Subjective/Objective Assessment:   Pt admitted with sepsis/UTI     Action/Plan:   PTA pt lived at home with spouse- per wife-has private pay aide that comes in-for 2hrs/day, has hospital bed, lift, w/c- NCM to follow for d/c needs  requested PT/OT   Anticipated DC Date:  02/15/2014   Anticipated DC Plan:  [place]  CM consult      [facility] Choice  HOME HEALTH   Choice offered to / List presented to:  C-3 Spouse        HH arranged  HH-1 RN  [place].   Status of service:  Completed, signed off Medicare Important Message given?  YES (If response is "NO", the following Medicare IM given date fields will be blank) Date Medicare IM given:  02/14/2014 Medicare IM given by:  Marvetta Gibbons Date Additional Medicare IM given:   Additional Medicare IM given by:    Discharge Disposition:  [place]  Per UR Regulation:  Reviewed for med. necessity/level of care/duration of stay  If discussed at [place] of Stay Meetings, dates discussed:    Comments:  02/15/14- 1500- Marvetta Gibbons RN, BSN [phone] Pt for d/c home today with wife- wife has declined STSNF placement and wants to take pt home with [facility]- per conversation with wife- she has used [facility] in the past and wants to use them again for [place] [place] [facility] services- no DME needs- referral called to Butch Penny with [facility] for HH-RN/PT/OT- pt will need ambulance transport home- will let CSW know- [facility] referral placed - they will see regarding potential following in community.

## 2014-02-15 NOTE — Discharge Summary (Signed)
Physician Discharge Summary  Joshua Rodgers:[id number] DOB: 21-Jul-1923 DOA: 02/11/2014  PCP: Kandice Hams, MD  Admit date: 02/11/2014 Discharge date: 02/15/2014  Time spent: 45 minutes  Recommendations for Outpatient Follow-up:  1. Follow up with PCP for INR recheck on Friday 02/16/14, and for BP medication management. 2. Discharge home with HHPT/OT/RN. PT recommends discharge to SNF, wife refusing and insists on taking him           home with home health PT    Discharge Diagnoses:  Principal Problem:   Sepsis Active Problems:   Lumbar stenosis   HTN (hypertension)   Bladder, atonic   History of DVT (deep vein thrombosis)   Constipation   Toxic encephalopathy   UTI (lower urinary tract infection)   Chronic diastolic CHF (congestive heart failure)   Atrial fibrillation with RVR   Elevated troponin   Sepsis secondary to UTI   Sepsis due to Escherichia coli   Diarrhea   Chronic indwelling Foley catheter   Chronic atrial fibrillation   Benign essential hypertension antepartum   DVT (deep venous thrombosis)   Normocytic anemia   BPH (benign prostatic hyperplasia)   Discharge Condition: Stable  Diet recommendation: Heart healthy  Filed Weights   02/13/14 1504 02/14/14 1502 02/15/14 0327  Weight: 80.8 kg (178 lb 2.1 oz) 84.8 kg (186 lb 15.2 oz) 82.9 kg (182 lb 12.2 oz)    History of present illness:  81 y.o. male with PMH of  dementia, paroxysmal atrial fibrillation, DVT on Coumadin, HTN, prostate cancer with chronic Foley catheter in place who presented to ED on 1/31 with worsening lower abdominal pain and fever.  At baseline, pt with bilateral foot drop and is nonambulatory due to severe spinal stenosis and has been bed to wheelchair bound for the past 2 years. In the ED, pt was found to have atrial fibrillation with RVR, and UA with evidence of  UTI. Indwelling catheter was changed in ED, pt was placed on empiric IV antibiotics, and admitted for further management. IV  antibiotics were modified according to cultures, and he was transitioned to oral Ceftin, which he will need to continue on discharge. Also, pt with episode of hypokalemia, which has resolved on discharge.    Hospital Course:   Sepsis secondary to Proteus Mirabilis and Ecoli UTI -Patient presented with sepsis secondary to UTI from clogged chronic indwelling foley catheter. He was placed on empiric antibiotic, which were subsequently changed to Rocephin, after growth of gram negative rods on urine culture.  He was then transitioned to oral Ceftin. Organisms sensitive to Ciprofloxacin -On discharge, continue Ciprofloxacin 500mg  PO BID for 12 days.   Diarrhea -Resolved, likely secondary to UTI  Chronic indwelling foley catheter -Catheter well draining on discharge. Was changed at presentation to ER  Acute toxic metabolic encephalopathy in setting of dementia -Per wife, pt is back to baseline mental status  Chronic afib with acute RVR -Pt with history of paroxysmal afib.  He developed an episode of afib with RVR, and was placed on Metoprolol. -On discharge continue metoprolol 12.5mg  BID  HTN -Allow permissive HTN on discharge. Placed on Metoprolol 12.5mg  BID. Discontinue Norvasc 10mg  daily.  -Follow up with PCP for BP medications adjustment. -BP stable on discharge -STOP TAKING Norvasc  -Placed on Metoprolol 12.5mg  BID -follow up with PCP for adjustment of BP meds  DVT  -No complains this hospitalization, continue chronic Coumadin   -Follow up with PCP for INR recheck on Friday 02/16/14  Elevated Troponin -Trop were  elevated but suspected secondary to sepsis, pt denied any chest pain.  Chronic Diastolic CHF -Appears Euvolemic on discharge.  2D echo 06/2013 with EF 99-35%, grade I diastolic dysfunction.  -Continue Amiloride 10mg  BID  Hypokalemia -K normal on discharge. Repleted with oral/IV potassium.  -Continue Kdur 49meq daily  Hypomagnesemia -Mg normal on discharge. Repleted  with IV mag   Lumbar Spinal Stenosis/Bilateral Foot Drop -With bilateral brace   Dementia -stable, per wife, pt is at baseline, alert, no oriented.  Depression -stable. Continue Celexa 30 mg daily   Normocytic anemia  Hgb . Continue iron tablets daily  GERD Continue Protonix 40 mg daily  BPH Continue Tamsulosin 0.4 mg daily  Procedures:  None  Consultations:  None  Discharge Exam: Filed Vitals:   02/15/14 1100  BP: 172/81  Pulse: 87  Temp: 98.1 F (36.7 C)  Resp: 22     Exam General: Pleasant demented AA male in NAD. Cardiovascular: Regular rate and rhythm.  No murmurs, rubs, or gallops. Respiratory: Clear to auscultate bilaterally.  No rhonchi or crepitations. Abdomen: Soft nontender bowel sounds present. No guarding or rigidity Musculoskeletal: No edema. Bilateral brace placed  Discharge Instructions     Discharge Instructions    Diet - low sodium heart healthy    Complete by:  As directed      Increase activity slowly    Complete by:  As directed             Medication List    STOP taking these medications        amLODipine 10 MG tablet  Commonly known as:  NORVASC      TAKE these medications        acetaminophen 500 MG tablet  Commonly known as:  TYLENOL  Take 1,000 mg by mouth every 6 (six) hours as needed for moderate pain, fever or headache.     ALPRAZolam 0.5 MG tablet  Commonly known as:  XANAX  Take 0.5 mg by mouth 3 (three) times daily as needed for anxiety. For anxiety.     aMILoride 5 MG tablet  Commonly known as:  MIDAMOR  Take 10 mg by mouth 2 (two) times daily.     CALTRATE 600 PLUS-VIT D PO  Take 1 tablet by mouth daily.     cholecalciferol 1000 UNITS tablet  Commonly known as:  VITAMIN D  Take 1,000 Units by mouth daily.     ciprofloxacin 500 MG tablet  Commonly known as:  CIPRO  Take 1 tablet (500 mg total) by mouth 2 (two) times daily.     citalopram 20 MG tablet  Commonly known as:  CELEXA  Take 20 mg by  mouth daily.     DSS 100 MG Caps  Take 100 mg by mouth 2 (two) times daily.     ferrous sulfate 324 (65 FE) MG Tbec  Take 1 tablet by mouth daily.     GLUCOSAMINE CHONDR 1500 COMPLX Caps  Take 1 capsule by mouth daily.     memantine 28 MG Cp24 24 hr capsule  Commonly known as:  NAMENDA XR  Take 28 mg by mouth daily.     metoprolol tartrate 25 MG tablet  Commonly known as:  LOPRESSOR  Take 0.5 tablets (12.5 mg total) by mouth 2 (two) times daily.     pantoprazole 40 MG tablet  Commonly known as:  PROTONIX  Take 40 mg by mouth daily.     potassium chloride SA 20 MEQ tablet  Commonly  known as:  K-DUR,KLOR-CON  Take 20 mEq by mouth 2 (two) times daily.     tamsulosin 0.4 MG Caps capsule  Commonly known as:  FLOMAX  Take 0.8 mg by mouth at bedtime.     VITAMIN B 12 PO  Take 1,000 mg by mouth daily.     warfarin 2 MG tablet  Commonly known as:  COUMADIN  Take 2 mg by mouth every morning.       Allergies  Allergen Reactions  . Contrast Media [Iodinated Diagnostic Agents]     Pt blacked out  . Iohexol      Desc: sob, throat swelling (1988 gdc) pt requires full premeds and does well, JB 8/14/7    Follow-up Information    Follow up with Kandice Hams, MD.   Specialty:  Internal Medicine   Contact information:   [address] [place] [address] [phone]       Follow up with [facility].   Why:  [facility]- RN/PT/OT arranged   Contact information:   [address] [place] [address] [phone]        The results of significant diagnostics from this hospitalization (including imaging, microbiology, ancillary and laboratory) are listed below for reference.    Significant Diagnostic Studies: Dg Chest Port 1 View  (if Code Sepsis Called)  02/11/2014   CLINICAL DATA:  CODE SEPSIS; ATRIAL FIBRILLATION - Pt from home via [facility] with c/o shaking and fidgeting overnight with fever and decreased urine output. Pt reported  lower mid abdominal pain and found to be a-fib  EXAM: PORTABLE CHEST - 1 VIEW  COMPARISON:  06/19/2013  FINDINGS: Cardiac silhouette is mildly enlarged. Aorta is uncoiled and mildly tortuous. No mediastinal or hilar masses or evidence of adenopathy. Clear lungs. No pleural effusion or pneumothorax.  Bony thorax is grossly intact.  IMPRESSION: No acute cardiopulmonary disease.   Electronically Signed   By: Lajean Manes M.D.   On: 02/11/2014 11:04    Microbiology: Recent Results (from the past 240 hour(s))  Blood Culture (routine x 2)     Status: None (Preliminary result)   Collection Time: 02/11/14  9:59 AM  Result Value Ref Range Status   Specimen Description BLOOD RIGHT FOREARM  Final   Special Requests BOTTLES DRAWN AEROBIC AND ANAEROBIC 5CC  Final   Culture   Final           BLOOD CULTURE RECEIVED NO GROWTH TO DATE CULTURE WILL BE HELD FOR 5 DAYS BEFORE ISSUING A FINAL NEGATIVE REPORT Performed at [facility]    Report Status PENDING  Incomplete  Urine culture     Status: None   Collection Time: 02/11/14 10:08 AM  Result Value Ref Range Status   Specimen Description URINE, CATHETERIZED  Final   Special Requests NONE  Final   Colony Count   Final    >=100,000 COLONIES/ML Performed at [facility]    Culture   Final    [place] Performed at [facility]    Report Status 02/15/2014 FINAL  Final   Organism ID, Bacteria ESCHERICHIA COLI  Final   Organism ID, Bacteria PROTEUS MIRABILIS  Final      Susceptibility   Escherichia coli - MIC*    AMPICILLIN 8 SENSITIVE Sensitive     CEFAZOLIN <=4 SENSITIVE Sensitive     CEFTRIAXONE <=1 SENSITIVE Sensitive     CIPROFLOXACIN <=0.25 SENSITIVE Sensitive     GENTAMICIN <=1  SENSITIVE Sensitive     LEVOFLOXACIN <=0.12 SENSITIVE Sensitive     NITROFURANTOIN <=16 SENSITIVE Sensitive     TOBRAMYCIN <=1 SENSITIVE Sensitive     TRIMETH/SULFA <=20 SENSITIVE Sensitive     PIP/TAZO <=4 SENSITIVE  Sensitive     * ESCHERICHIA COLI   Proteus mirabilis - MIC*    AMPICILLIN >=32 RESISTANT Resistant     CEFAZOLIN <=4 SENSITIVE Sensitive     CEFTRIAXONE <=1 SENSITIVE Sensitive     CIPROFLOXACIN <=0.25 SENSITIVE Sensitive     GENTAMICIN <=1 SENSITIVE Sensitive     LEVOFLOXACIN <=0.12 SENSITIVE Sensitive     NITROFURANTOIN RESISTANT      TOBRAMYCIN <=1 SENSITIVE Sensitive     TRIMETH/SULFA >=320 RESISTANT Resistant     PIP/TAZO 8 SENSITIVE Sensitive     * PROTEUS MIRABILIS  Blood Culture (routine x 2)     Status: None (Preliminary result)   Collection Time: 02/11/14 10:53 AM  Result Value Ref Range Status   Specimen Description BLOOD RIGHT HAND  Final   Special Requests BOTTLES DRAWN AEROBIC AND ANAEROBIC 10CC  Final   Culture   Final           BLOOD CULTURE RECEIVED NO GROWTH TO DATE CULTURE WILL BE HELD FOR 5 DAYS BEFORE ISSUING A FINAL NEGATIVE REPORT Performed at [facility]    Report Status PENDING  Incomplete  MRSA PCR Screening     Status: None   Collection Time: 02/11/14  2:40 PM  Result Value Ref Range Status   MRSA by PCR NEGATIVE NEGATIVE Final    Comment:        The GeneXpert MRSA Assay (FDA approved for NASAL specimens only), is one component of a comprehensive MRSA colonization surveillance program. It is not intended to diagnose MRSA infection nor to guide or monitor treatment for MRSA infections.      Labs: Basic Metabolic Panel:  Recent Labs Lab 02/13/14 0115 02/13/14 1350 02/14/14 0252 02/14/14 0937 02/14/14 1620 02/15/14 0313  NA 142  --  141 142 142 142  K 2.8* 2.5* 2.9* 2.6* 3.1* 3.6  CL 112  --  110 108 107 111  CO2 24  --  26 29 28 26   GLUCOSE 107*  --  100* 110* 103* 101*  BUN 19  --  15 13 12 14   CREATININE 1.38*  --  1.16 1.09 1.05 1.11  CALCIUM 8.1*  --  8.0* 8.4 8.4 8.2*  MG  --  1.4* 1.9  --   --  1.7   Liver Function Tests:  Recent Labs Lab 02/11/14 1009 02/13/14 0115  AST 24 17  ALT 9 9  ALKPHOS 51 39   BILITOT 0.6 0.3  PROT 6.4 4.9*  ALBUMIN 3.7 3.0*   No results for input(s): LIPASE, AMYLASE in the last 168 hours. No results for input(s): AMMONIA in the last 168 hours. CBC:  Recent Labs Lab 02/11/14 1009 02/12/14 0300 02/13/14 0115 02/14/14 0252 02/14/14 0937 02/15/14 0313  WBC 9.7 5.1 4.0 4.4  --  4.1  NEUTROABS 8.4*  --   --   --   --   --   HGB 12.0* 10.5* 10.0* 9.5* 10.5* 9.7*  HCT 36.8* 32.5* 30.8* 29.3* 32.3* 29.7*  MCV 91.5 92.1 92.2 90.2  --  90.3  PLT 208 163 153 145*  --  150   Cardiac Enzymes:  Recent Labs Lab 02/11/14 1009 02/11/14 1715 02/11/14 2038 02/12/14 0300  TROPONINI 0.16* 0.24* 0.17* 0.15*  BNP: BNP (last 3 results) No results for input(s): BNP in the last 8760 hours.  ProBNP (last 3 results) No results for input(s): PROBNP in the last 8760 hours.  CBG: No results for input(s): GLUCAP in the last 168 hours.     SignedNorlene Campbell  [phone] [facility] 02/15/2014, 4:16 PM  Examined Patient and discussed A&P with PA Sahar and agree with above plan. I have reviewed the entire database. I have made any necessary editorial changes, and agree with its content. I have reviewed this patient's available data, including medical history, events of note, physical examination, radiology studies and test results as part of my evaluation Pt with Multiple Complex medical problems> 35 min spent in direct Pt care  Dia Crawford, MD [facility] [phone] pager

## 2014-02-15 NOTE — Consult Note (Signed)
Received referral from inpatient RNCM for [facility] services. Came to bedside to explain and offer [facility] services. Consents obtained. Wife states the plan is to go home with home health. Noted SNF placement was declined. Explained that [facility] will not interfere or replace services offered by home health. Also discussed how Mr Lenker will receive post hospital transition of care calls and will be evaluated for monthly home visits. Left [facility] Management packet at bedside and contact information confirmed. Mrs. Jolliff endorses patient could benefit from additional support regarding community resources as well.  Marthenia Rolling, MSN-RN,BSN- [facility] [phone]

## 2014-02-15 NOTE — Progress Notes (Signed)
ANTICOAGULATION CONSULT NOTE - Follow Up Consult  Pharmacy Consult for Coumadin Indication: DVT history  Allergies  Allergen Reactions  . Contrast Media [Iodinated Diagnostic Agents]     Pt blacked out  . Iohexol      Desc: sob, throat swelling (1988 gdc) pt requires full premeds and does well, JB 8/14/7     Labs:  Recent Labs  02/13/14 0115 02/14/14 0252 02/14/14 0937 02/14/14 1620 02/15/14 0313  HGB 10.0* 9.5* 10.5*  --  9.7*  HCT 30.8* 29.3* 32.3*  --  29.7*  PLT 153 145*  --   --  150  LABPROT 25.5* 22.7*  --   --  22.0*  INR 2.30* 1.98*  --   --  1.90*  CREATININE 1.38* 1.16 1.09 1.05 1.11    Estimated Creatinine Clearance: 49 mL/min (by C-G formula based on Cr of 1.11).   Assessment: 81 year old male admitted with abdominal pain and fever on Coumadin PTA for DVT history Admit INR low at 1.81, now 1.9 (increased dose yesterday)  Goal of Therapy:  INR 2-3 Monitor platelets by anticoagulation protocol: Yes   Plan:  Continue Coumadin to  2.5 mg po daily at 1800 pm Daily INR for now  Thank you. Anette Guarneri, PharmD [phone]  02/15/2014,11:09 AM

## 2014-02-15 NOTE — Discharge Instructions (Signed)
STOP TAKING NORVASC (AMLODIPINE) FOR HTN START TAKING METOPROLOL (LOPRESSOR) FOR HTN AND FAST HEART RHYTHM.  FOLLOW UP WITH PCP FOR ADJUSTMENT OF MEDICATION FOLLOW UP WITH PCP FOR INR CHECKS

## 2014-02-17 LAB — CULTURE, BLOOD (ROUTINE X 2)
Culture: NO GROWTH
Culture: NO GROWTH

## 2014-04-04 ENCOUNTER — Emergency Department (HOSPITAL_COMMUNITY)
Admission: EM | Admit: 2014-04-04 | Discharge: 2014-04-05 | Disposition: A | Discharge status: Home / Self Care | Payer: Medicare PPO | Attending: Emergency Medicine | Admitting: Emergency Medicine

## 2014-04-04 ENCOUNTER — Encounter (HOSPITAL_COMMUNITY): Payer: Self-pay | Admitting: Emergency Medicine

## 2014-04-04 ENCOUNTER — Emergency Department (HOSPITAL_COMMUNITY): Payer: Medicare PPO

## 2014-04-04 ENCOUNTER — Other Ambulatory Visit: Payer: Self-pay

## 2014-04-04 DIAGNOSIS — F0390 Unspecified dementia without behavioral disturbance: Secondary | ICD-10-CM | POA: Insufficient documentation

## 2014-04-04 DIAGNOSIS — J45901 Unspecified asthma with (acute) exacerbation: Secondary | ICD-10-CM | POA: Insufficient documentation

## 2014-04-04 DIAGNOSIS — Z7901 Long term (current) use of anticoagulants: Secondary | ICD-10-CM | POA: Diagnosis not present

## 2014-04-04 DIAGNOSIS — F419 Anxiety disorder, unspecified: Secondary | ICD-10-CM | POA: Diagnosis not present

## 2014-04-04 DIAGNOSIS — R0600 Dyspnea, unspecified: Secondary | ICD-10-CM | POA: Insufficient documentation

## 2014-04-04 DIAGNOSIS — I10 Essential (primary) hypertension: Secondary | ICD-10-CM | POA: Diagnosis not present

## 2014-04-04 DIAGNOSIS — D3500 Benign neoplasm of unspecified adrenal gland: Secondary | ICD-10-CM | POA: Insufficient documentation

## 2014-04-04 DIAGNOSIS — Z87891 Personal history of nicotine dependence: Secondary | ICD-10-CM | POA: Insufficient documentation

## 2014-04-04 DIAGNOSIS — E278 Other specified disorders of adrenal gland: Secondary | ICD-10-CM

## 2014-04-04 DIAGNOSIS — R079 Chest pain, unspecified: Secondary | ICD-10-CM | POA: Insufficient documentation

## 2014-04-04 DIAGNOSIS — Z8669 Personal history of other diseases of the nervous system and sense organs: Secondary | ICD-10-CM | POA: Diagnosis not present

## 2014-04-04 DIAGNOSIS — I509 Heart failure, unspecified: Secondary | ICD-10-CM | POA: Insufficient documentation

## 2014-04-04 DIAGNOSIS — R911 Solitary pulmonary nodule: Secondary | ICD-10-CM

## 2014-04-04 DIAGNOSIS — Z8546 Personal history of malignant neoplasm of prostate: Secondary | ICD-10-CM | POA: Insufficient documentation

## 2014-04-04 DIAGNOSIS — Z8739 Personal history of other diseases of the musculoskeletal system and connective tissue: Secondary | ICD-10-CM | POA: Diagnosis not present

## 2014-04-04 DIAGNOSIS — E876 Hypokalemia: Secondary | ICD-10-CM | POA: Diagnosis not present

## 2014-04-04 DIAGNOSIS — Z86718 Personal history of other venous thrombosis and embolism: Secondary | ICD-10-CM | POA: Diagnosis not present

## 2014-04-04 DIAGNOSIS — Z87448 Personal history of other diseases of urinary system: Secondary | ICD-10-CM | POA: Diagnosis not present

## 2014-04-04 DIAGNOSIS — E559 Vitamin D deficiency, unspecified: Secondary | ICD-10-CM | POA: Diagnosis not present

## 2014-04-04 DIAGNOSIS — I77810 Thoracic aortic ectasia: Secondary | ICD-10-CM | POA: Insufficient documentation

## 2014-04-04 DIAGNOSIS — Z87440 Personal history of urinary (tract) infections: Secondary | ICD-10-CM | POA: Insufficient documentation

## 2014-04-04 DIAGNOSIS — R0989 Other specified symptoms and signs involving the circulatory and respiratory systems: Secondary | ICD-10-CM | POA: Insufficient documentation

## 2014-04-04 DIAGNOSIS — Z8719 Personal history of other diseases of the digestive system: Secondary | ICD-10-CM | POA: Insufficient documentation

## 2014-04-04 DIAGNOSIS — F329 Major depressive disorder, single episode, unspecified: Secondary | ICD-10-CM | POA: Diagnosis not present

## 2014-04-04 DIAGNOSIS — Z79899 Other long term (current) drug therapy: Secondary | ICD-10-CM | POA: Diagnosis not present

## 2014-04-04 DIAGNOSIS — T17320A Food in larynx causing asphyxiation, initial encounter: Secondary | ICD-10-CM

## 2014-04-04 LAB — CBG MONITORING, ED: Glucose-Capillary: 156 mg/dL — ABNORMAL HIGH (ref 70–99)

## 2014-04-04 LAB — BASIC METABOLIC PANEL
Anion gap: 14 (ref 5–15)
BUN: 26 mg/dL — AB (ref 6–23)
CALCIUM: 9.7 mg/dL (ref 8.4–10.5)
CO2: 24 mmol/L (ref 19–32)
Chloride: 105 mmol/L (ref 96–112)
Creatinine, Ser: 1.37 mg/dL — ABNORMAL HIGH (ref 0.50–1.35)
GFR, EST AFRICAN AMERICAN: 51 mL/min — AB (ref >90)
GFR, EST NON AFRICAN AMERICAN: 44 mL/min — AB (ref >90)
Glucose, Bld: 152 mg/dL — ABNORMAL HIGH (ref 70–99)
POTASSIUM: 4.7 mmol/L (ref 3.5–5.1)
Sodium: 143 mmol/L (ref 135–145)

## 2014-04-04 LAB — CBC WITH DIFFERENTIAL/PLATELET
BASOS PCT: 0 % (ref 0–1)
Basophils Absolute: 0 10*3/uL (ref 0.0–0.1)
EOS ABS: 0.1 10*3/uL (ref 0.0–0.7)
EOS PCT: 1 % (ref 0–5)
HCT: 37.1 % — ABNORMAL LOW (ref 39.0–52.0)
HEMOGLOBIN: 11.8 g/dL — AB (ref 13.0–17.0)
LYMPHS PCT: 15 % (ref 12–46)
Lymphs Abs: 1 10*3/uL (ref 0.7–4.0)
MCH: 30.1 pg (ref 26.0–34.0)
MCHC: 31.8 g/dL (ref 30.0–36.0)
MCV: 94.6 fL (ref 78.0–100.0)
MONOS PCT: 6 % (ref 3–12)
Monocytes Absolute: 0.4 10*3/uL (ref 0.1–1.0)
NEUTROS PCT: 78 % — AB (ref 43–77)
Neutro Abs: 5.3 10*3/uL (ref 1.7–7.7)
Platelets: 221 10*3/uL (ref 150–400)
RBC: 3.92 MIL/uL — ABNORMAL LOW (ref 4.22–5.81)
RDW: 14.6 % (ref 11.5–15.5)
WBC: 6.7 10*3/uL (ref 4.0–10.5)

## 2014-04-04 LAB — I-STAT TROPONIN, ED: Troponin i, poc: 0.03 ng/mL (ref 0.00–0.08)

## 2014-04-04 LAB — PROTIME-INR
INR: 1.81 — AB (ref 0.00–1.49)
Prothrombin Time: 21.1 seconds — ABNORMAL HIGH (ref 11.6–15.2)

## 2014-04-04 MED ORDER — ALBUTEROL SULFATE HFA 108 (90 BASE) MCG/ACT IN AERS
2.0000 | INHALATION_SPRAY | Freq: Once | RESPIRATORY_TRACT | Status: AC
  Administered 2014-04-05: 2 via RESPIRATORY_TRACT
  Filled 2014-04-04: qty 6.7

## 2014-04-04 NOTE — ED Provider Notes (Signed)
CSN: [id number]     Arrival date & time 04/04/14  1908 History   First MD Initiated Contact with Patient 04/04/14 1947     Chief Complaint  Patient presents with  . Chest Pain     (Consider location/radiation/quality/duration/timing/severity/associated sxs/prior Treatment) HPI Comments: Patient wife reports that about 2 hours after eating lunch.  He woke up from a nap and complained of shortness of breath and chest discomfort.  Patient is to nursing notes.  Wife states that he did not have a choking episode at that time that did have some mild coughing while he was eating lunch earlier in the day.  Patient initially told me that he was currently having chest pain, though on repeat questioning denied having chest pain.  Wife states that he has been doing well recently, has not been ill, has not had fevers, chills, coughing, vomiting, diarrhea.  He is on Coumadin for history of A. fib  Patient is a 81 y.o. male presenting with chest pain.  Chest Pain Associated symptoms: shortness of breath   Associated symptoms: no abdominal pain, no back pain, no cough, no dizziness, no dysphagia, no fatigue, no fever, no headache, no nausea, no numbness, not vomiting and no weakness     Past Medical History  Diagnosis Date  . Generalized weakness   . Hypokalemia   . Renal disorder   . Hyperlipidemia   . Vitamin D deficiency   . Bell's palsy   . Lumbar stenosis   . Hypertension   . Depression   . Dementia   . Prostate cancer ~ 2004    "had radiation tx" (11/24/2012)  . DVT (deep venous thrombosis) 11/24/2012    Acute DVT in the rightcommon femoral vein and acute DVT involving the leftcommon femorall,profinda popliteal and posterior tibial vein 2013. Pt seen by hematology in 2014. W/S revealed continued clot in left leg  . Asthma     "as a child, real bad" (11/24/2012)  . History of bleeding ulcers ~ 1956    "in hospital for ~ 1 month" (11/24/2012)  . Recurrent UTI (urinary tract infection)  2013-now    "lots" (11/24/2012)  . DJD (degenerative joint disease)   . Arthritis     "joints" (11/24/2012)  . Anxiety   . Hyperaldosteronism   . CHF (congestive heart failure)     Diastolic dysfunction  . Vitamin B12 deficiency   . Spinal stenosis     Dr Christella Noa  . Dilated aortic root   . Mild aortic stenosis     echo 2012   Past Surgical History  Procedure Laterality Date  . Total knee arthroplasty Left 02/2010  . Back surgery  2008    "for stenosis" (11/24/2012)  . Esophagogastroduodenoscopy Left 02/16/2013    Procedure: ESOPHAGOGASTRODUODENOSCOPY (EGD);  Surgeon: Arta Silence, MD;  Location: [facility] ENDOSCOPY;  Service: Endoscopy;  Laterality: Left;   Family History  Problem Relation Age of Onset  . Heart disease Mother   . Diabetes Father    History  Substance Use Topics  . Smoking status: Former Smoker -- 10 years    Types: Cigarettes  . Smokeless tobacco: Never Used     Comment: 11/24/2012 "quit smoking cigarettes in ~ 1956"  . Alcohol Use: Yes     Comment: occasoinal wine    Review of Systems  Constitutional: Negative for fever, activity change, appetite change and fatigue.  HENT: Negative for congestion, facial swelling, rhinorrhea and trouble swallowing.   Eyes: Negative for photophobia and pain.  Respiratory: Positive for shortness of breath. Negative for cough and chest tightness.   Cardiovascular: Positive for chest pain. Negative for leg swelling.  Gastrointestinal: Negative for nausea, vomiting, abdominal pain, diarrhea and constipation.  Endocrine: Negative for polydipsia and polyuria.  Genitourinary: Negative for dysuria, urgency, decreased urine volume and difficulty urinating.  Musculoskeletal: Negative for back pain and gait problem.  Skin: Negative for color change, rash and wound.  Allergic/Immunologic: Negative for immunocompromised state.  Neurological: Negative for dizziness, facial asymmetry, speech difficulty, weakness, numbness and headaches.   Psychiatric/Behavioral: Negative for confusion, decreased concentration and agitation.      Allergies  Contrast media and Iohexol  Home Medications   Prior to Admission medications   Medication Sig Start Date End Date Taking? Authorizing Provider  acetaminophen (TYLENOL) 500 MG tablet Take 1,000 mg by mouth every 6 (six) hours as needed for moderate pain, fever or headache.   Yes Historical Provider, MD  ALPRAZolam Duanne Moron) 0.5 MG tablet Take 0.5 mg by mouth 3 (three) times daily as needed for anxiety. For anxiety.   Yes Historical Provider, MD  aMILoride (MIDAMOR) 5 MG tablet Take 10 mg by mouth 2 (two) times daily.    Yes Historical Provider, MD  Calcium-Vitamin D (CALTRATE 600 PLUS-VIT D PO) Take 1 tablet by mouth daily.    Yes Historical Provider, MD  cholecalciferol (VITAMIN D) 1000 UNITS tablet Take 1,000 Units by mouth daily.   Yes Historical Provider, MD  citalopram (CELEXA) 20 MG tablet Take 20 mg by mouth daily. 11/11/12  Yes Historical Provider, MD  Cyanocobalamin (VITAMIN B 12 PO) Take 1,000 mg by mouth daily.   Yes Historical Provider, MD  docusate sodium 100 MG CAPS Take 100 mg by mouth 2 (two) times daily. 08/28/13  Yes Marianne L [place], PA-C  ferrous sulfate 324 (65 FE) MG TBEC Take 1 tablet by mouth daily.    Yes Historical Provider, MD  Glucosamine-Chondroit-Vit C-Mn (GLUCOSAMINE CHONDR 1500 COMPLX) CAPS Take 1 capsule by mouth daily.   Yes Historical Provider, MD  memantine (NAMENDA XR) 28 MG CP24 24 hr capsule Take 28 mg by mouth daily.   Yes Historical Provider, MD  metoprolol tartrate (LOPRESSOR) 25 MG tablet Take 0.5 tablets (12.5 mg total) by mouth 2 (two) times daily. 02/15/14 02/15/15 Yes [place], PA-C  pantoprazole (PROTONIX) 40 MG tablet Take 40 mg by mouth daily.   Yes Historical Provider, MD  potassium chloride SA (K-DUR,KLOR-CON) 20 MEQ tablet Take 20 mEq by mouth 2 (two) times daily.   Yes Historical Provider, MD  Tamsulosin HCl (FLOMAX) 0.4 MG CAPS Take 0.8  mg by mouth at bedtime.   Yes Historical Provider, MD  warfarin (COUMADIN) 2 MG tablet Take 2-3 mg by mouth every morning. 2 mg every day except on Saturday and Sunday patient to take 3 mg   Yes Historical Provider, MD   BP 160/107 mmHg  Pulse 91  Temp(Src) 99 F (37.2 C) (Oral)  Resp 20  SpO2 94% Physical Exam  Constitutional: He is oriented to person, place, and time. He appears well-developed and well-nourished. No distress.  HENT:  Head: Normocephalic and atraumatic.  Mouth/Throat: No oropharyngeal exudate.  Eyes: Pupils are equal, round, and reactive to light.  Neck: Normal range of motion. Neck supple.  Cardiovascular: Normal rate, regular rhythm and normal heart sounds.  Exam reveals no gallop and no friction rub.   No murmur heard. Pulmonary/Chest: Effort normal. No respiratory distress. He has no wheezes. He has rales in the  left lower field.  Abdominal: Soft. Bowel sounds are normal. He exhibits no distension and no mass. There is no tenderness. There is no rebound and no guarding.  Musculoskeletal: Normal range of motion. He exhibits no edema or tenderness.  Neurological: He is alert and oriented to person, place, and time. He has normal strength. He displays no tremor. No cranial nerve deficit or sensory deficit. He exhibits normal muscle tone. Coordination normal. GCS eye subscore is 4. GCS verbal subscore is 4. GCS motor subscore is 6.  At baseline mental status per family.  He has chronic appearing deformities of bilateral feet  Skin: Skin is warm and dry.  Psychiatric: He has a normal mood and affect.    ED Course  Procedures (including critical care time) Labs Review Labs Reviewed  CBC WITH DIFFERENTIAL/PLATELET - Abnormal; Notable for the following:    RBC 3.92 (*)    Hemoglobin 11.8 (*)    HCT 37.1 (*)    Neutrophils Relative % 78 (*)    All other components within normal limits  BASIC METABOLIC PANEL - Abnormal; Notable for the following:    Glucose, Bld 152  (*)    BUN 26 (*)    Creatinine, Ser 1.37 (*)    GFR calc non Af Amer 44 (*)    GFR calc Af Amer 51 (*)    All other components within normal limits  PROTIME-INR - Abnormal; Notable for the following:    Prothrombin Time 21.1 (*)    INR 1.81 (*)    All other components within normal limits  CBG MONITORING, ED - Abnormal; Notable for the following:    Glucose-Capillary 156 (*)    All other components within normal limits  I-STAT TROPOININ, ED  Randolm Idol, ED    Imaging Review Dg Chest 2 View  04/04/2014   CLINICAL DATA:  Patient with chest pain after choking earlier today  EXAM: CHEST  2 VIEW  COMPARISON:  None.  FINDINGS: There is a 1 x 1 cm nodular opacity in the left upper lobe near the apex. Elsewhere lungs are clear. Heart is upper normal in size with pulmonary vascularity within normal limits. No adenopathy. No bone lesions. Stomach is distended with fluid and air.  IMPRESSION: 1 x 1 cm nodular opacity left upper lobe near the apex. This finding may warrant noncontrast enhanced chest CT to further assess.  No edema or consolidation.  Stomach distended with fluid and air.   Electronically Signed   By: Lowella Grip III M.D.   On: 04/04/2014 20:31   Ct Chest Wo Contrast  04/05/2014   CLINICAL DATA:  81 year old male patient with lung nodule on radiograph. Chest pain and dyspnea after choking incident.  EXAM: CT CHEST WITHOUT CONTRAST  TECHNIQUE: Multidetector CT imaging of the chest was performed following the standard protocol without IV contrast.  COMPARISON:  Chest radiograph 3 hours prior  FINDINGS: There are 2 well-circumscribed nodules in the left upper lobe. More superiorly nodule measures 9 x 9 mm with small central cavitation, image 19/65. More centrally there is an 8 x 6 mm nodule in the left upper lobe, image 22/65. There is scattered fissural thickening involving the transverse fissure in the right lung, no discrete right-sided pulmonary nodules. Mild emphysematous  change. There is elevation of left hemidiaphragm. Motion artifact through the lower lobes partially obscures evaluation. Central bronchial thickening is seen. No consolidation to suggest pneumonia.  Small prevascular lymph node measuring 7 mm in short axis dimension. No  additional mediastinal adenopathy. No evidence for hilar adenopathy. The heart size is normal.  Mild aneurysmal dilatation of the ascending aorta measuring 4.3 cm. Transverse and descending thoracic aorta are normal in caliber, tortuous in course. There is moderate atherosclerotic calcifications throughout. Dense coronary artery calcifications.  No pleural or pericardial effusion. Evaluation of the upper abdomen demonstrates of 3.1 x 2.6 cm left adrenal nodule. There is a 1.6 x 1.2 cm right adrenal nodule.  The bones are under mineralized. There is a 5 mm sclerotic focus within T10. No destructive lytic lesions. No acute osseous abnormality.  IMPRESSION: 1. Two nodules in the left upper lobe, 1 of which corresponds to the abnormality on radiograph. Larger nodule measures 9x9 mm with central cavitation, smaller nodule measures 8 x 6 mm. Both is these nodules are well circumscribed in appearance, however are nonspecific. Post infectious or inflammatory etiology versus is neoplasm are considered. Comparison with any prior imaging would be most helpful to evaluate for interval stability. 2. Aneurysmal dilatation of the ascending thoracic aorta of 4.3 cm. Recommend annual imaging followup by CTA or MRA. This recommendation follows 2010 ACCF/AHA/AATS/ACR/ASA/SCA/SCAI/SIR/STS/SVM Guidelines for the Diagnosis and Management of Patients with Thoracic Aortic Disease. Circulation. 2010; 121: G891-Q945. 3. Bilateral adrenal nodules, on the left this measures 3.1 cm, on the right 1.6 cm. Again comparison with any prior imaging would be most helpful. 4. In the absence of prior imaging further characterization of both the adrenal nodules and the lung nodules could  be considered with PET-CT. 5. Mild emphysema. 6. Atherosclerosis including coronary artery calcifications.   Electronically Signed   By: Jeb Levering M.D.   On: 04/05/2014 00:49     EKG Interpretation None      MDM   Final diagnoses:  Chest pain  Choking due to food (regurgitated), initial encounter  Lung nodule  Dyspnea  Incidental lung nodule  Dilatation of thoracic aorta  Adrenal incidentaloma    Pt is a 81 y.o. male with Pmhx as above who presents with SOB/chest discomfort upon waking from a nap this afternoon. Pt currently asymptomatic. On PE, pt has rales LLL.  Vitals are stable.  He is in no acute distress.  No acute ischemic changes on EKG chest x-ray with nodular opacity in the left upper lobe.  With recommendations for CT CT chest shows 2 left upper lobe nodules, as well as bilateral adrenal nodules and aneurysmal dilatation of the thoracic aorta to 4.3 cm.  We'll have patient follow-up with PCP for consideration for a PET CT as well as yearly reevaluation of aneurysm. I am not sure of cause of shortness of breath that he has been asymptomatic in the department and has also had a -3 hour delta troponin.  I do not feel that this episode was likely ischemic.  I recommended that he follow up closely with his PCP     Leitha Bleak evaluation in the Emergency Department is complete. It has been determined that no acute conditions requiring further emergency intervention are present at this time. The patient/guardian have been advised of the diagnosis and plan. We have discussed signs and symptoms that warrant return to the ED, such as changes or worsening in symptoms, recurrence of symptoms, fever      Ernestina Patches, MD 04/05/14 0120

## 2014-04-04 NOTE — ED Notes (Signed)
Patient transported to CT 

## 2014-04-04 NOTE — ED Notes (Addendum)
BP 220/110. Pt from home, dementia pt, per family pt was being fed at 3 pm this afternoon and choked. Since then has been complaining of chest discomfort. Per EMS upper air way is "gurgling" and lower airway is clear; given 324 aspirin. 18 g IV in LAC. Hx of a-fib, pt in a fib at this time per EMS. SpO2 96% on RA. CBG 168

## 2014-04-04 NOTE — ED Notes (Signed)
Pt sounds like his voice is "gurgling" when he speaks. O2 sats at 97% on RA.

## 2014-04-04 NOTE — ED Notes (Signed)
Patient transported to X-ray 

## 2014-04-05 LAB — I-STAT TROPONIN, ED: Troponin i, poc: 0.05 ng/mL (ref 0.00–0.08)

## 2014-04-05 NOTE — Discharge Instructions (Signed)

## 2014-04-05 NOTE — ED Notes (Signed)
Dr. Docherty at bedside.

## 2014-04-05 NOTE — ED Notes (Signed)
Secretary notified that Corey Harold is needed for pt transport

## 2014-04-20 ENCOUNTER — Other Ambulatory Visit (HOSPITAL_COMMUNITY): Payer: Self-pay | Admitting: Internal Medicine

## 2014-04-20 DIAGNOSIS — R1314 Dysphagia, pharyngoesophageal phase: Secondary | ICD-10-CM

## 2014-04-24 ENCOUNTER — Ambulatory Visit (HOSPITAL_COMMUNITY): Payer: Medicare PPO

## 2014-04-24 ENCOUNTER — Other Ambulatory Visit (HOSPITAL_COMMUNITY): Payer: Medicare PPO

## 2014-04-30 ENCOUNTER — Ambulatory Visit (HOSPITAL_COMMUNITY)
Admission: RE | Admit: 2014-04-30 | Discharge: 2014-04-30 | Disposition: A | Discharge status: Home / Self Care | Payer: Medicare PPO | Source: Ambulatory Visit | Attending: Internal Medicine | Admitting: Internal Medicine

## 2014-04-30 DIAGNOSIS — F0390 Unspecified dementia without behavioral disturbance: Secondary | ICD-10-CM | POA: Diagnosis not present

## 2014-04-30 DIAGNOSIS — R1312 Dysphagia, oropharyngeal phase: Secondary | ICD-10-CM | POA: Diagnosis not present

## 2014-04-30 DIAGNOSIS — R1314 Dysphagia, pharyngoesophageal phase: Secondary | ICD-10-CM

## 2014-04-30 DIAGNOSIS — R1310 Dysphagia, unspecified: Secondary | ICD-10-CM | POA: Diagnosis present

## 2014-06-28 ENCOUNTER — Encounter (HOSPITAL_COMMUNITY): Payer: Self-pay | Admitting: Emergency Medicine

## 2014-06-28 ENCOUNTER — Inpatient Hospital Stay (HOSPITAL_COMMUNITY)
Admission: EM | Admit: 2014-06-28 | Discharge: 2014-06-30 | DRG: 309 | Disposition: A | Discharge status: Home / Self Care | Payer: Medicare PPO | Attending: Internal Medicine | Admitting: Internal Medicine

## 2014-06-28 ENCOUNTER — Other Ambulatory Visit (HOSPITAL_COMMUNITY): Payer: Medicare PPO

## 2014-06-28 ENCOUNTER — Inpatient Hospital Stay (HOSPITAL_COMMUNITY): Payer: Medicare PPO

## 2014-06-28 ENCOUNTER — Emergency Department (HOSPITAL_COMMUNITY): Payer: Medicare PPO

## 2014-06-28 DIAGNOSIS — Z86718 Personal history of other venous thrombosis and embolism: Secondary | ICD-10-CM | POA: Diagnosis not present

## 2014-06-28 DIAGNOSIS — I480 Paroxysmal atrial fibrillation: Principal | ICD-10-CM | POA: Diagnosis present

## 2014-06-28 DIAGNOSIS — I4891 Unspecified atrial fibrillation: Secondary | ICD-10-CM

## 2014-06-28 DIAGNOSIS — M4806 Spinal stenosis, lumbar region: Secondary | ICD-10-CM | POA: Diagnosis present

## 2014-06-28 DIAGNOSIS — Z7901 Long term (current) use of anticoagulants: Secondary | ICD-10-CM

## 2014-06-28 DIAGNOSIS — Z888 Allergy status to other drugs, medicaments and biological substances status: Secondary | ICD-10-CM | POA: Diagnosis not present

## 2014-06-28 DIAGNOSIS — Z8546 Personal history of malignant neoplasm of prostate: Secondary | ICD-10-CM

## 2014-06-28 DIAGNOSIS — Z91041 Radiographic dye allergy status: Secondary | ICD-10-CM | POA: Diagnosis not present

## 2014-06-28 DIAGNOSIS — F329 Major depressive disorder, single episode, unspecified: Secondary | ICD-10-CM | POA: Diagnosis present

## 2014-06-28 DIAGNOSIS — Z993 Dependence on wheelchair: Secondary | ICD-10-CM

## 2014-06-28 DIAGNOSIS — N390 Urinary tract infection, site not specified: Secondary | ICD-10-CM | POA: Diagnosis present

## 2014-06-28 DIAGNOSIS — I248 Other forms of acute ischemic heart disease: Secondary | ICD-10-CM | POA: Diagnosis present

## 2014-06-28 DIAGNOSIS — G510 Bell's palsy: Secondary | ICD-10-CM | POA: Diagnosis present

## 2014-06-28 DIAGNOSIS — I214 Non-ST elevation (NSTEMI) myocardial infarction: Secondary | ICD-10-CM

## 2014-06-28 DIAGNOSIS — Z7401 Bed confinement status: Secondary | ICD-10-CM

## 2014-06-28 DIAGNOSIS — F0390 Unspecified dementia without behavioral disturbance: Secondary | ICD-10-CM | POA: Diagnosis present

## 2014-06-28 DIAGNOSIS — Z9889 Other specified postprocedural states: Secondary | ICD-10-CM

## 2014-06-28 DIAGNOSIS — M1990 Unspecified osteoarthritis, unspecified site: Secondary | ICD-10-CM | POA: Diagnosis present

## 2014-06-28 DIAGNOSIS — Z960 Presence of urogenital implants: Secondary | ICD-10-CM

## 2014-06-28 DIAGNOSIS — I482 Chronic atrial fibrillation, unspecified: Secondary | ICD-10-CM | POA: Diagnosis present

## 2014-06-28 DIAGNOSIS — R079 Chest pain, unspecified: Secondary | ICD-10-CM

## 2014-06-28 DIAGNOSIS — N312 Flaccid neuropathic bladder, not elsewhere classified: Secondary | ICD-10-CM | POA: Diagnosis present

## 2014-06-28 DIAGNOSIS — I350 Nonrheumatic aortic (valve) stenosis: Secondary | ICD-10-CM | POA: Diagnosis present

## 2014-06-28 DIAGNOSIS — E785 Hyperlipidemia, unspecified: Secondary | ICD-10-CM | POA: Diagnosis present

## 2014-06-28 DIAGNOSIS — R7989 Other specified abnormal findings of blood chemistry: Secondary | ICD-10-CM | POA: Diagnosis not present

## 2014-06-28 DIAGNOSIS — Z923 Personal history of irradiation: Secondary | ICD-10-CM | POA: Diagnosis not present

## 2014-06-28 DIAGNOSIS — I10 Essential (primary) hypertension: Secondary | ICD-10-CM | POA: Diagnosis present

## 2014-06-28 DIAGNOSIS — M48061 Spinal stenosis, lumbar region without neurogenic claudication: Secondary | ICD-10-CM | POA: Diagnosis present

## 2014-06-28 DIAGNOSIS — I5030 Unspecified diastolic (congestive) heart failure: Secondary | ICD-10-CM | POA: Diagnosis present

## 2014-06-28 DIAGNOSIS — Z978 Presence of other specified devices: Secondary | ICD-10-CM

## 2014-06-28 DIAGNOSIS — R778 Other specified abnormalities of plasma proteins: Secondary | ICD-10-CM | POA: Diagnosis present

## 2014-06-28 DIAGNOSIS — Z87891 Personal history of nicotine dependence: Secondary | ICD-10-CM

## 2014-06-28 DIAGNOSIS — Z96652 Presence of left artificial knee joint: Secondary | ICD-10-CM | POA: Diagnosis present

## 2014-06-28 DIAGNOSIS — J45909 Unspecified asthma, uncomplicated: Secondary | ICD-10-CM | POA: Diagnosis present

## 2014-06-28 DIAGNOSIS — R0602 Shortness of breath: Secondary | ICD-10-CM

## 2014-06-28 HISTORY — DX: Unspecified atrial fibrillation: I48.91

## 2014-06-28 LAB — BASIC METABOLIC PANEL
ANION GAP: 16 — AB (ref 5–15)
BUN: 22 mg/dL — ABNORMAL HIGH (ref 6–20)
CO2: 20 mmol/L — ABNORMAL LOW (ref 22–32)
Calcium: 9.6 mg/dL (ref 8.9–10.3)
Chloride: 107 mmol/L (ref 101–111)
Creatinine, Ser: 1.39 mg/dL — ABNORMAL HIGH (ref 0.61–1.24)
GFR, EST AFRICAN AMERICAN: 51 mL/min — AB (ref >60)
GFR, EST NON AFRICAN AMERICAN: 44 mL/min — AB (ref >60)
Glucose, Bld: 146 mg/dL — ABNORMAL HIGH (ref 65–99)
POTASSIUM: 4.7 mmol/L (ref 3.5–5.1)
SODIUM: 143 mmol/L (ref 135–145)

## 2014-06-28 LAB — BRAIN NATRIURETIC PEPTIDE: B Natriuretic Peptide: 100.6 pg/mL — ABNORMAL HIGH (ref 0.0–100.0)

## 2014-06-28 LAB — URINALYSIS, ROUTINE W REFLEX MICROSCOPIC
GLUCOSE, UA: NEGATIVE mg/dL
HGB URINE DIPSTICK: NEGATIVE
Ketones, ur: NEGATIVE mg/dL
Nitrite: NEGATIVE
PH: 8.5 — AB (ref 5.0–8.0)
Protein, ur: 100 mg/dL — AB
SPECIFIC GRAVITY, URINE: 1.022 (ref 1.005–1.030)
Urobilinogen, UA: 0.2 mg/dL (ref 0.0–1.0)

## 2014-06-28 LAB — CBC WITH DIFFERENTIAL/PLATELET
BASOS ABS: 0 10*3/uL (ref 0.0–0.1)
BASOS PCT: 0 % (ref 0–1)
EOS ABS: 0 10*3/uL (ref 0.0–0.7)
Eosinophils Relative: 0 % (ref 0–5)
HCT: 39.9 % (ref 39.0–52.0)
HEMOGLOBIN: 12.6 g/dL — AB (ref 13.0–17.0)
Lymphocytes Relative: 18 % (ref 12–46)
Lymphs Abs: 1.7 10*3/uL (ref 0.7–4.0)
MCH: 30.2 pg (ref 26.0–34.0)
MCHC: 31.6 g/dL (ref 30.0–36.0)
MCV: 95.7 fL (ref 78.0–100.0)
Monocytes Absolute: 0.9 10*3/uL (ref 0.1–1.0)
Monocytes Relative: 10 % (ref 3–12)
Neutro Abs: 6.7 10*3/uL (ref 1.7–7.7)
Neutrophils Relative %: 72 % (ref 43–77)
PLATELETS: 203 10*3/uL (ref 150–400)
RBC: 4.17 MIL/uL — ABNORMAL LOW (ref 4.22–5.81)
RDW: 15 % (ref 11.5–15.5)
WBC: 9.4 10*3/uL (ref 4.0–10.5)

## 2014-06-28 LAB — I-STAT TROPONIN, ED: Troponin i, poc: 0.24 ng/mL (ref 0.00–0.08)

## 2014-06-28 LAB — URINE MICROSCOPIC-ADD ON

## 2014-06-28 LAB — TROPONIN I
TROPONIN I: 0.75 ng/mL — AB (ref <0.031)
TROPONIN I: 0.84 ng/mL — AB (ref <0.031)

## 2014-06-28 LAB — PROTIME-INR
INR: 2.25 — AB (ref 0.00–1.49)
Prothrombin Time: 24.6 seconds — ABNORMAL HIGH (ref 11.6–15.2)

## 2014-06-28 LAB — MRSA PCR SCREENING: MRSA BY PCR: POSITIVE — AB

## 2014-06-28 LAB — TSH: TSH: 2.218 u[IU]/mL (ref 0.350–4.500)

## 2014-06-28 MED ORDER — FERROUS SULFATE 324 (65 FE) MG PO TBEC: 1.0000 | DELAYED_RELEASE_TABLET | Freq: Every day | ORAL | Status: DC

## 2014-06-28 MED ORDER — METOPROLOL TARTRATE 12.5 MG HALF TABLET
12.5000 mg | ORAL_TABLET | Freq: Two times a day (BID) | ORAL | Status: DC
  Administered 2014-06-28 – 2014-06-30 (×4): 12.5 mg via ORAL
  Filled 2014-06-28 (×6): qty 1

## 2014-06-28 MED ORDER — ASPIRIN 81 MG PO CHEW
324.0000 mg | CHEWABLE_TABLET | Freq: Once | ORAL | Status: AC
  Administered 2014-06-28: 324 mg via ORAL
  Filled 2014-06-28: qty 4

## 2014-06-28 MED ORDER — VITAMIN D3 25 MCG (1000 UNIT) PO TABS
1000.0000 [IU] | ORAL_TABLET | Freq: Every day | ORAL | Status: DC
  Administered 2014-06-28 – 2014-06-30 (×3): 1000 [IU] via ORAL
  Filled 2014-06-28 (×3): qty 1

## 2014-06-28 MED ORDER — CITALOPRAM HYDROBROMIDE 20 MG PO TABS
20.0000 mg | ORAL_TABLET | Freq: Every day | ORAL | Status: DC
  Administered 2014-06-28 – 2014-06-30 (×3): 20 mg via ORAL
  Filled 2014-06-28 (×3): qty 1

## 2014-06-28 MED ORDER — WARFARIN SODIUM 2 MG PO TABS
2.0000 mg | ORAL_TABLET | Freq: Once | ORAL | Status: AC
  Administered 2014-06-28: 2 mg via ORAL
  Filled 2014-06-28: qty 1

## 2014-06-28 MED ORDER — PIPERACILLIN-TAZOBACTAM 3.375 G IVPB
3.3750 g | Freq: Three times a day (TID) | INTRAVENOUS | Status: DC
  Administered 2014-06-28 – 2014-06-30 (×6): 3.375 g via INTRAVENOUS
  Filled 2014-06-28 (×8): qty 50

## 2014-06-28 MED ORDER — ONDANSETRON HCL 4 MG/2ML IJ SOLN
4.0000 mg | Freq: Four times a day (QID) | INTRAMUSCULAR | Status: DC | PRN
Start: 2014-06-28 — End: 2014-06-30

## 2014-06-28 MED ORDER — DEXTROSE 5 % IV SOLN
1.0000 g | Freq: Once | INTRAVENOUS | Status: DC
  Administered 2014-06-28: 1 g via INTRAVENOUS
  Filled 2014-06-28: qty 10

## 2014-06-28 MED ORDER — AMILORIDE HCL 5 MG PO TABS: 10.0000 mg | ORAL_TABLET | Freq: Two times a day (BID) | ORAL | Status: DC

## 2014-06-28 MED ORDER — PANTOPRAZOLE SODIUM 40 MG PO TBEC
40.0000 mg | DELAYED_RELEASE_TABLET | Freq: Every day | ORAL | Status: DC
  Administered 2014-06-28 – 2014-06-29 (×2): 40 mg via ORAL
  Filled 2014-06-28: qty 1

## 2014-06-28 MED ORDER — FERROUS SULFATE 325 (65 FE) MG PO TABS
325.0000 mg | ORAL_TABLET | Freq: Every day | ORAL | Status: DC
  Administered 2014-06-28 – 2014-06-30 (×3): 325 mg via ORAL
  Filled 2014-06-28 (×4): qty 1

## 2014-06-28 MED ORDER — DEXTROSE 5 % IV SOLN
5.0000 mg/h | INTRAVENOUS | Status: DC
  Administered 2014-06-28: 5 mg/h via INTRAVENOUS
  Filled 2014-06-28 (×2): qty 100

## 2014-06-28 MED ORDER — PIPERACILLIN-TAZOBACTAM 3.375 G IVPB 30 MIN
3.3750 g | Freq: Once | INTRAVENOUS | Status: AC
  Administered 2014-06-28: 3.375 g via INTRAVENOUS
  Filled 2014-06-28: qty 50

## 2014-06-28 MED ORDER — TAMSULOSIN HCL 0.4 MG PO CAPS
0.8000 mg | ORAL_CAPSULE | Freq: Every day | ORAL | Status: DC
  Administered 2014-06-28: 0.8 mg via ORAL
  Filled 2014-06-28 (×3): qty 2

## 2014-06-28 MED ORDER — ACETAMINOPHEN 500 MG PO TABS: 1000.0000 mg | ORAL_TABLET | Freq: Four times a day (QID) | ORAL | Status: DC | PRN

## 2014-06-28 MED ORDER — ONDANSETRON HCL 4 MG PO TABS: 4.0000 mg | ORAL_TABLET | Freq: Four times a day (QID) | ORAL | Status: DC | PRN

## 2014-06-28 MED ORDER — ASPIRIN 81 MG PO CHEW
324.0000 mg | CHEWABLE_TABLET | Freq: Every day | ORAL | Status: DC
  Administered 2014-06-29 – 2014-06-30 (×2): 324 mg via ORAL
  Filled 2014-06-28 (×2): qty 4

## 2014-06-28 MED ORDER — DILTIAZEM LOAD VIA INFUSION
10.0000 mg | Freq: Once | INTRAVENOUS | Status: AC
  Administered 2014-06-28: 10 mg via INTRAVENOUS
  Filled 2014-06-28: qty 10

## 2014-06-28 MED ORDER — WARFARIN - PHARMACIST DOSING INPATIENT: Freq: Every day | Status: DC

## 2014-06-28 MED ORDER — ACETAMINOPHEN 325 MG PO TABS: 650.0000 mg | ORAL_TABLET | Freq: Four times a day (QID) | ORAL | Status: DC | PRN

## 2014-06-28 MED ORDER — MEMANTINE HCL ER 28 MG PO CP24
28.0000 mg | ORAL_CAPSULE | Freq: Every day | ORAL | Status: DC
  Administered 2014-06-28 – 2014-06-30 (×3): 28 mg via ORAL
  Filled 2014-06-28 (×3): qty 1

## 2014-06-28 MED ORDER — ALPRAZOLAM 0.5 MG PO TABS
0.5000 mg | ORAL_TABLET | Freq: Three times a day (TID) | ORAL | Status: DC | PRN
  Administered 2014-06-28: 0.5 mg via ORAL
  Filled 2014-06-28: qty 1

## 2014-06-28 MED ORDER — CHLORHEXIDINE GLUCONATE CLOTH 2 % EX PADS
6.0000 | MEDICATED_PAD | Freq: Every day | CUTANEOUS | Status: DC
  Administered 2014-06-29 – 2014-06-30 (×2): 6 via TOPICAL

## 2014-06-28 MED ORDER — MUPIROCIN 2 % EX OINT
1.0000 "application " | TOPICAL_OINTMENT | Freq: Two times a day (BID) | CUTANEOUS | Status: DC
  Administered 2014-06-28 – 2014-06-30 (×4): 1 via NASAL
  Filled 2014-06-28 (×2): qty 22

## 2014-06-28 MED ORDER — DOCUSATE SODIUM 100 MG PO CAPS
100.0000 mg | ORAL_CAPSULE | Freq: Two times a day (BID) | ORAL | Status: DC
  Administered 2014-06-28 – 2014-06-30 (×3): 100 mg via ORAL
  Filled 2014-06-28 (×6): qty 1

## 2014-06-28 MED ORDER — ACETAMINOPHEN 650 MG RE SUPP: 650.0000 mg | Freq: Four times a day (QID) | RECTAL | Status: DC | PRN

## 2014-06-28 NOTE — Progress Notes (Signed)
Echocardiogram 2D Echocardiogram has been performed.  Joshua Rodgers 06/28/2014, 3:57 PM

## 2014-06-28 NOTE — Consult Note (Signed)
CARDIOLOGY CONSULT NOTE  Patient ID: Joshua Rodgers, MRN: [id number], DOB/AGE: [id number] 79 y.o. Admit date: 06/28/2014 Date of Consult: 06/28/2014  Primary Physician: Kandice Hams, MD Primary Cardiologist: Dr Radford Pax Referring Physician: Dr Leonides Schanz  Chief Complaint: Weakness Reason for Consultation: Atrial fibrillation with RVR  HPI: [name] is an 81 year old gentleman with diastolic heart failure and paroxysmal atrial fibrillation. He has a multitude of medical problems and has been wheelchair bound now for some time. Other medical problems include dementia, recurrent urinary tract infections and sepsis, and spinal stenosis with associated chronic lower extremity weakness. The patient is treated with warfarin for oral anticoagulation in the setting of atrial fibrillation and history of DVT.  The patient presents with complaints of chest pain and shortness of breath. He was brought in by EMS. He also complained of weakness. His metoprolol was held because he has been feeling bad. At the time of my interview, the patient's wife is at the bedside. She provides all of the history as the patient has fairly advanced dementia. She states that he has been having chest pain on and off for the past few months. He denies any chest pain at present. He is currently eating breakfast. He's had leg swelling, especially on the left, worse over the last few days.  Medical History:  Past Medical History  Diagnosis Date  . Generalized weakness   . Hypokalemia   . Renal disorder   . Hyperlipidemia   . Vitamin D deficiency   . Bell's palsy   . Lumbar stenosis   . Hypertension   . Depression   . Dementia   . Prostate cancer ~ 2004    "had radiation tx" (11/24/2012)  . DVT (deep venous thrombosis) 11/24/2012    Acute DVT in the rightcommon femoral vein and acute DVT involving the leftcommon femorall,profinda popliteal and posterior tibial vein 2013. Pt seen by hematology in 2014. W/S revealed continued  clot in left leg  . Asthma     "as a child, real bad" (11/24/2012)  . History of bleeding ulcers ~ 1956    "in hospital for ~ 1 month" (11/24/2012)  . Recurrent UTI (urinary tract infection) 2013-now    "lots" (11/24/2012)  . DJD (degenerative joint disease)   . Arthritis     "joints" (11/24/2012)  . Anxiety   . Hyperaldosteronism   . CHF (congestive heart failure)     Diastolic dysfunction  . Vitamin B12 deficiency   . Spinal stenosis     Dr Christella Noa  . Dilated aortic root   . Mild aortic stenosis     echo 2012  . Atrial fibrillation       Surgical History:  Past Surgical History  Procedure Laterality Date  . Total knee arthroplasty Left 02/2010  . Back surgery  2008    "for stenosis" (11/24/2012)  . Esophagogastroduodenoscopy Left 02/16/2013    Procedure: ESOPHAGOGASTRODUODENOSCOPY (EGD);  Surgeon: Arta Silence, MD;  Location: [facility] ENDOSCOPY;  Service: Endoscopy;  Laterality: Left;     Home Meds: Prior to Admission medications   Medication Sig Start Date End Date Taking? Authorizing Provider  acetaminophen (TYLENOL) 500 MG tablet Take 1,000 mg by mouth every 6 (six) hours as needed for moderate pain, fever or headache.   Yes Historical Provider, MD  ALPRAZolam Duanne Moron) 0.5 MG tablet Take 0.5 mg by mouth 3 (three) times daily as needed for anxiety. For anxiety.   Yes Historical Provider, MD  aMILoride (MIDAMOR) 5 MG tablet Take 10 mg  by mouth 2 (two) times daily.    Yes Historical Provider, MD  Calcium-Vitamin D (CALTRATE 600 PLUS-VIT D PO) Take 1 tablet by mouth daily.    Yes Historical Provider, MD  cholecalciferol (VITAMIN D) 1000 UNITS tablet Take 1,000 Units by mouth daily.   Yes Historical Provider, MD  citalopram (CELEXA) 20 MG tablet Take 20 mg by mouth daily. 11/11/12  Yes Historical Provider, MD  Cyanocobalamin (VITAMIN B 12 PO) Take 1,000 mg by mouth daily.   Yes Historical Provider, MD  docusate sodium 100 MG CAPS Take 100 mg by mouth 2 (two) times daily. 08/28/13   Yes Marianne L York, PA-C  ferrous sulfate 324 (65 FE) MG TBEC Take 1 tablet by mouth daily.    Yes Historical Provider, MD  Glucosamine-Chondroit-Vit C-Mn (GLUCOSAMINE CHONDR 1500 COMPLX) CAPS Take 1 capsule by mouth daily.   Yes Historical Provider, MD  memantine (NAMENDA XR) 28 MG CP24 24 hr capsule Take 28 mg by mouth daily.   Yes Historical Provider, MD  metoprolol tartrate (LOPRESSOR) 25 MG tablet Take 0.5 tablets (12.5 mg total) by mouth 2 (two) times daily. 02/15/14 02/15/15 Yes Francis, PA-C  pantoprazole (PROTONIX) 40 MG tablet Take 40 mg by mouth daily.   Yes Historical Provider, MD  potassium chloride SA (K-DUR,KLOR-CON) 20 MEQ tablet Take 20 mEq by mouth 2 (two) times daily.   Yes Historical Provider, MD  Tamsulosin HCl (FLOMAX) 0.4 MG CAPS Take 0.8 mg by mouth at bedtime.   Yes Historical Provider, MD  warfarin (COUMADIN) 2 MG tablet Take 2-3 mg by mouth every morning. 2 mg every day except on Saturday and Sunday patient to take 3 mg   Yes Historical Provider, MD    Inpatient Medications:    . diltiazem (CARDIZEM) infusion 10 mg/hr (06/28/14 0539)  . piperacillin-tazobactam 3.375 g (06/28/14 0707)    Allergies:  Allergies  Allergen Reactions  . Contrast Media [Iodinated Diagnostic Agents]     Pt blacked out  . Iohexol      Desc: sob, throat swelling (1988 gdc) pt requires full premeds and does well, JB 8/14/7     History   Social History  . Marital Status: Married    Spouse Name: N/A  . Number of Children: N/A  . Years of Education: N/A   Occupational History  . Not on file.   Social History Main Topics  . Smoking status: Former Smoker -- 10 years    Types: Cigarettes  . Smokeless tobacco: Never Used     Comment: 11/24/2012 "quit smoking cigarettes in ~ 1956"  . Alcohol Use: Yes     Comment: occasoinal wine  . Drug Use: No  . Sexual Activity: No   Other Topics Concern  . Not on file   Social History Narrative     Family History  Problem Relation  Age of Onset  . Heart disease Mother   . Diabetes Father      Review of Systems: Unable to obtain because of the patient's dementia. Wife reports good appetite, intermittent chest pain, shortness of breath, and weakness  Physical Exam: Blood pressure 165/109, pulse 101, temperature 97.7 F (36.5 C), temperature source Oral, resp. rate 25, height 5\' 11"  (1.803 m), SpO2 100 %. Pt is awake and alert, not oriented, elderly male, in no distress. HEENT: normal Neck: JVP normal. Carotid upstrokes normal without bruits. No thyromegaly. Lungs: equal expansion, clear bilaterally CV: RRR grade 2/6 ejection murmur at the left sternal border Abd: soft,  NT, +BS, no bruit, no hepatosplenomegaly Back: no CVA tenderness Ext: 2+ left pretibial edema, 1+ right pretibial edema Skin: warm and dry without rash Neuro: CNII-XII intact             Strength intact = bilaterally    Labs: No results for input(s): CKTOTAL, CKMB, TROPONINI in the last 72 hours. Lab Results  Component Value Date   WBC 9.4 06/28/2014   HGB 12.6* 06/28/2014   HCT 39.9 06/28/2014   MCV 95.7 06/28/2014   PLT 203 06/28/2014    Recent Labs Lab 06/28/14 0435  NA 143  K 4.7  CL 107  CO2 20*  BUN 22*  CREATININE 1.39*  CALCIUM 9.6  GLUCOSE 146*   Lab Results  Component Value Date   CHOL * 01/27/2008    251        ATP III CLASSIFICATION:  <200     mg/dL   Desirable  200-239  mg/dL   Borderline High  >=240    mg/dL   High          HDL 45 01/27/2008   LDLCALC * 01/27/2008    170        Total Cholesterol/HDL:CHD Risk Coronary Heart Disease Risk Table                     Men   Women  1/2 Average Risk   3.4   3.3  Average Risk       5.0   4.4  2 X Average Risk   9.6   7.1  3 X Average Risk  23.4   11.0        Use the calculated Patient Ratio above and the CHD Risk Table to determine the patient's CHD Risk.        ATP III CLASSIFICATION (LDL):  <100     mg/dL   Optimal  100-129  mg/dL   Near or Above                     Optimal  130-159  mg/dL   Borderline  160-189  mg/dL   High  >190     mg/dL   Very High   TRIG 181* 01/27/2008   No results found for: DDIMER  Radiology/Studies:  Dg Chest Port 1 View  06/28/2014   CLINICAL DATA:  Shortness of breath.  EXAM: PORTABLE CHEST - 1 VIEW  COMPARISON:  None.  FINDINGS: Shallow inspiration with elevation of the left hemidiaphragm. Heart size and pulmonary vascularity are normal for technique. Mild atelectasis in the lung bases. Circumscribed nodular opacity in the left apex measuring 9 mm. This could represent a rib sclerosis or a pulmonary nodule. Suggest follow-up with CT for further characterization if clinically indicated. Calcified and tortuous aorta. No pneumothorax.  IMPRESSION: Shallow inspiration with probable atelectasis in the lung bases. Indeterminate nodular opacity in the left apex. Consider chest CT further evaluation peer   Electronically Signed   By: Lucienne Capers M.D.   On: 06/28/2014 05:28    EKG:  Initial EKG from 4:28 AM today shows atrial fibrillation with RVR 148 bpm, early R-wave transition.  Repeat EKG from 6:35 AM this morning shows sinus tachycardia 104 bpm, early R-wave transition  Cardiac Studies: 2-D echocardiogram 06/21/2013: Study Conclusions  - Left ventricle: The cavity size was normal. Wall thickness was increased in a pattern of severe LVH. Systolic function was normal. The estimated ejection fraction was in the range of  55% to 60%. Doppler parameters are consistent with abnormal left ventricular relaxation (grade 1 diastolic dysfunction). - Aortic valve: Valve area (VTI): 1.68 cm^2. Valve area (Vmax): 1.77 cm^2. - Left atrium: The atrium was mildly dilated.  ASSESSMENT AND PLAN:  1. Atrial fibrillation with RVR: The patient is back in sinus rhythm. Suspect this is due to acute illness. This patient has a chronic indwelling Foley catheter and has had recurrent urinary tract infections requiring  hospitalization. He is chronically anticoagulated with warfarin and his INR is therapeutic. He has converted back to sinus rhythm on IV diltiazem 10 mg per hour. Would start him back on metoprolol at his home dose. I would anticipate getting him off of diltiazem tomorrow morning, but I would probably leave it running today while his clinical course is monitored over the first 24 hours of admission.  2. Chest pain with elevated troponin. This may be related to demand ischemia from atrial fibrillation. The patient is clearly not a candidate for any invasive evaluation and I would recommend conservative medical therapy with a beta blocker and when necessary analgesics for pain. It is reasonable to cycle cardiac enzymes and check an echocardiogram to help with prognostic implications. I would be in support of conservative care for this patient.  3. Chronic anticoagulation: Seems to be tolerating without bleeding problems  Other medical problems per the primary service. We will follow along during his hospital course.  SignedSherren Mocha MD, [facility] 06/28/2014, 7:36 AM

## 2014-06-28 NOTE — Progress Notes (Signed)
ANTIBIOTIC CONSULT NOTE - INITIAL  Pharmacy Consult for Zosyn Indication: UTI  Allergies  Allergen Reactions  . Contrast Media [Iodinated Diagnostic Agents]     Pt blacked out  . Iohexol      Desc: sob, throat swelling (1988 gdc) pt requires full premeds and does well, JB 8/14/7     Patient Measurements: Height: 5\' 7"  (170.2 cm) Weight: 158 lb 6.4 oz (71.85 kg) IBW/kg (Calculated) : 66.1 Adjusted Body Weight:   Vital Signs: Temp: 97.7 F (36.5 C) (06/16 0800) Temp Source: Oral (06/16 0435) BP: 187/105 mmHg (06/16 0800) Pulse Rate: 110 (06/16 0730) Intake/Output from previous day:   Intake/Output from this shift:    Labs:  Recent Labs  06/28/14 0435  WBC 9.4  HGB 12.6*  PLT 203  CREATININE 1.39*   Estimated Creatinine Clearance: 34.3 mL/min (by C-G formula based on Cr of 1.39). No results for input(s): VANCOTROUGH, VANCOPEAK, VANCORANDOM, GENTTROUGH, GENTPEAK, GENTRANDOM, TOBRATROUGH, TOBRAPEAK, TOBRARND, AMIKACINPEAK, AMIKACINTROU, AMIKACIN in the last 72 hours.   Microbiology: No results found for this or any previous visit (from the past 720 hour(s)).  Medical History: Past Medical History  Diagnosis Date  . Generalized weakness   . Hypokalemia   . Renal disorder   . Hyperlipidemia   . Vitamin D deficiency   . Bell's palsy   . Lumbar stenosis   . Hypertension   . Depression   . Dementia   . Prostate cancer ~ 2004    "had radiation tx" (11/24/2012)  . DVT (deep venous thrombosis) 11/24/2012    Acute DVT in the rightcommon femoral vein and acute DVT involving the leftcommon femorall,profinda popliteal and posterior tibial vein 2013. Pt seen by hematology in 2014. W/S revealed continued clot in left leg  . Asthma     "as a child, real bad" (11/24/2012)  . History of bleeding ulcers ~ 1956    "in hospital for ~ 1 month" (11/24/2012)  . Recurrent UTI (urinary tract infection) 2013-now    "lots" (11/24/2012)  . DJD (degenerative joint disease)   .  Arthritis     "joints" (11/24/2012)  . Anxiety   . Hyperaldosteronism   . CHF (congestive heart failure)     Diastolic dysfunction  . Vitamin B12 deficiency   . Spinal stenosis     Dr Christella Noa  . Dilated aortic root   . Mild aortic stenosis     echo 2012  . Atrial fibrillation     Medications:  Prescriptions prior to admission  Medication Sig Dispense Refill Last Dose  . acetaminophen (TYLENOL) 500 MG tablet Take 1,000 mg by mouth every 6 (six) hours as needed for moderate pain, fever or headache.   06/27/2014 at Unknown time  . ALPRAZolam (XANAX) 0.5 MG tablet Take 0.5 mg by mouth 3 (three) times daily as needed for anxiety. For anxiety.   06/27/2014 at Unknown time  . aMILoride (MIDAMOR) 5 MG tablet Take 10 mg by mouth 2 (two) times daily.    06/27/2014 at Unknown time  . Calcium-Vitamin D (CALTRATE 600 PLUS-VIT D PO) Take 1 tablet by mouth daily.    06/27/2014 at Unknown time  . cholecalciferol (VITAMIN D) 1000 UNITS tablet Take 1,000 Units by mouth daily.   06/27/2014 at Unknown time  . citalopram (CELEXA) 20 MG tablet Take 20 mg by mouth daily.   06/27/2014 at Unknown time  . Cyanocobalamin (VITAMIN B 12 PO) Take 1,000 mg by mouth daily.   06/27/2014 at Unknown time  . docusate  sodium 100 MG CAPS Take 100 mg by mouth 2 (two) times daily. 10 capsule 0 06/27/2014 at Unknown time  . ferrous sulfate 324 (65 FE) MG TBEC Take 1 tablet by mouth daily.    06/27/2014 at Unknown time  . Glucosamine-Chondroit-Vit C-Mn (GLUCOSAMINE CHONDR 1500 COMPLX) CAPS Take 1 capsule by mouth daily.   06/27/2014 at Unknown time  . memantine (NAMENDA XR) 28 MG CP24 24 hr capsule Take 28 mg by mouth daily.   06/27/2014 at Unknown time  . metoprolol tartrate (LOPRESSOR) 25 MG tablet Take 0.5 tablets (12.5 mg total) by mouth 2 (two) times daily. 60 tablet 11 06/27/2014 at Unknown time  . pantoprazole (PROTONIX) 40 MG tablet Take 40 mg by mouth daily.   06/27/2014 at Unknown time  . potassium chloride SA (K-DUR,KLOR-CON) 20  MEQ tablet Take 20 mEq by mouth 2 (two) times daily.   06/27/2014 at Unknown time  . Tamsulosin HCl (FLOMAX) 0.4 MG CAPS Take 0.8 mg by mouth at bedtime.   06/27/2014 at Unknown time  . warfarin (COUMADIN) 2 MG tablet Take 2-3 mg by mouth every morning. 2 mg every day except on Saturday and Sunday patient to take 3 mg   06/27/2014 at Unknown time   Scheduled:  . aMILoride  10 mg Oral BID  . [START ON 06/29/2014] aspirin  324 mg Oral Daily  . cholecalciferol  1,000 Units Oral Daily  . citalopram  20 mg Oral Daily  . docusate sodium  100 mg Oral BID  . ferrous sulfate  325 mg Oral Q breakfast  . memantine  28 mg Oral Daily  . metoprolol tartrate  12.5 mg Oral BID  . pantoprazole  40 mg Oral Q1200  . tamsulosin  0.8 mg Oral QHS   Infusions:  . diltiazem (CARDIZEM) infusion 10 mg/hr (06/28/14 0539)   Assessment: 81yo male with history of dementia, recurrent UTI, PAF and DVT on coumadin PTA presents with SOB. Pharmacy is consulted to dose Zosyn for suspected UTI. Pt is afebrile, WBC 9.4, sCr 1.4. Urine is turbid with many bacteria, large leuks, neg nitrite and 11-20 WBC.  Goal of Therapy:  Eradication of infection  Plan:  Zosyn 3.375g IV q8h Follow up culture results, renal function, and clinical course  Andrey Cota. Diona Foley, PharmD Clinical Pharmacist Pager [phone] 06/28/2014,8:59 AM

## 2014-06-28 NOTE — H&P (Signed)
[facility] History and Physical  Joshua Rodgers XTG:[id number] DOB: 1923/04/14 DOA: 06/28/2014  Referring physician: EDP PCP: Kandice Hams, MD   Chief Complaint: shortness of breath  HPI: Joshua Rodgers is a 81 y.o. male with a Past Medical History of dementia, paroxysmal atrial fibrillation and history of DVT on chronic Coumadin therapy, history of prostate cancer, history of chronic Foley catheter, severe spinal stenosis and bed bound with bilateral foot drop at baseline presents today with the above noted complaint. History is provided by his wife due to baseline dementia, she reports acute onset shortness of breath this morning associated with profuse diaphoresis, along with this mentioned having some chest pain this morning, denies any of these complaints at this time. No history of fevers. In addition because he was a little weak yesterday she did not give him his regular medicines including metoprolol. Today in the ER he was found to be in A. fib with RVR heart rate in the 130-150s, mildly elevated troponin, abnormal UA   Review of Systems: Unable to obtain due to dementia Constitutional:  No weight loss, night sweats, Fevers, chills, fatigue.  HEENT:  No headaches, Difficulty swallowing,Tooth/dental problems,Sore throat,  No sneezing, itching, ear ache, nasal congestion, post nasal drip,  Cardio-vascular:  No chest pain, Orthopnea, PND, swelling in lower extremities, anasarca, dizziness, palpitations  GI:  No heartburn, indigestion, abdominal pain, nausea, vomiting, diarrhea, change in bowel habits, loss of appetite  Resp:  No shortness of breath with exertion or at rest. No excess mucus, no productive cough, No non-productive cough, No coughing up of blood.No change in color of mucus.No wheezing.No chest wall deformity  Skin:  no rash or lesions.  GU:  no dysuria, change in color of urine, no urgency or frequency. No flank pain.  Musculoskeletal:  No joint pain or  swelling. No decreased range of motion. No back pain.  Psych:  No change in mood or affect. No depression or anxiety. No memory loss.   Past Medical History  Diagnosis Date  . Generalized weakness   . Hypokalemia   . Renal disorder   . Hyperlipidemia   . Vitamin D deficiency   . Bell's palsy   . Lumbar stenosis   . Hypertension   . Depression   . Dementia   . Prostate cancer ~ 2004    "had radiation tx" (11/24/2012)  . DVT (deep venous thrombosis) 11/24/2012    Acute DVT in the rightcommon femoral vein and acute DVT involving the leftcommon femorall,profinda popliteal and posterior tibial vein 2013. Pt seen by hematology in 2014. W/S revealed continued clot in left leg  . Asthma     "as a child, real bad" (11/24/2012)  . History of bleeding ulcers ~ 1956    "in hospital for ~ 1 month" (11/24/2012)  . Recurrent UTI (urinary tract infection) 2013-now    "lots" (11/24/2012)  . DJD (degenerative joint disease)   . Arthritis     "joints" (11/24/2012)  . Anxiety   . Hyperaldosteronism   . CHF (congestive heart failure)     Diastolic dysfunction  . Vitamin B12 deficiency   . Spinal stenosis     Dr Christella Noa  . Dilated aortic root   . Mild aortic stenosis     echo 2012  . Atrial fibrillation    Past Surgical History  Procedure Laterality Date  . Total knee arthroplasty Left 02/2010  . Back surgery  2008    "for stenosis" (11/24/2012)  . Esophagogastroduodenoscopy Left 02/16/2013  Procedure: ESOPHAGOGASTRODUODENOSCOPY (EGD);  Surgeon: Arta Silence, MD;  Location: [facility] ENDOSCOPY;  Service: Endoscopy;  Laterality: Left;   Social History:  reports that he has quit smoking. His smoking use included Cigarettes. He quit after 10 years of use. He has never used smokeless tobacco. He reports that he drinks alcohol. He reports that he does not use illicit drugs.  Allergies  Allergen Reactions  . Contrast Media [Iodinated Diagnostic Agents]     Pt blacked out  . Iohexol      Desc:  sob, throat swelling (1988 gdc) pt requires full premeds and does well, JB 8/14/7     Family History  Problem Relation Age of Onset  . Heart disease Mother   . Diabetes Father    Prior to Admission medications   Medication Sig Start Date End Date Taking? Authorizing Provider  acetaminophen (TYLENOL) 500 MG tablet Take 1,000 mg by mouth every 6 (six) hours as needed for moderate pain, fever or headache.   Yes Historical Provider, MD  ALPRAZolam Duanne Moron) 0.5 MG tablet Take 0.5 mg by mouth 3 (three) times daily as needed for anxiety. For anxiety.   Yes Historical Provider, MD  aMILoride (MIDAMOR) 5 MG tablet Take 10 mg by mouth 2 (two) times daily.    Yes Historical Provider, MD  Calcium-Vitamin D (CALTRATE 600 PLUS-VIT D PO) Take 1 tablet by mouth daily.    Yes Historical Provider, MD  cholecalciferol (VITAMIN D) 1000 UNITS tablet Take 1,000 Units by mouth daily.   Yes Historical Provider, MD  citalopram (CELEXA) 20 MG tablet Take 20 mg by mouth daily. 11/11/12  Yes Historical Provider, MD  Cyanocobalamin (VITAMIN B 12 PO) Take 1,000 mg by mouth daily.   Yes Historical Provider, MD  docusate sodium 100 MG CAPS Take 100 mg by mouth 2 (two) times daily. 08/28/13  Yes Marianne L York, PA-C  ferrous sulfate 324 (65 FE) MG TBEC Take 1 tablet by mouth daily.    Yes Historical Provider, MD  Glucosamine-Chondroit-Vit C-Mn (GLUCOSAMINE CHONDR 1500 COMPLX) CAPS Take 1 capsule by mouth daily.   Yes Historical Provider, MD  memantine (NAMENDA XR) 28 MG CP24 24 hr capsule Take 28 mg by mouth daily.   Yes Historical Provider, MD  metoprolol tartrate (LOPRESSOR) 25 MG tablet Take 0.5 tablets (12.5 mg total) by mouth 2 (two) times daily. 02/15/14 02/15/15 Yes Sutter, PA-C  pantoprazole (PROTONIX) 40 MG tablet Take 40 mg by mouth daily.   Yes Historical Provider, MD  potassium chloride SA (K-DUR,KLOR-CON) 20 MEQ tablet Take 20 mEq by mouth 2 (two) times daily.   Yes Historical Provider, MD  Tamsulosin HCl  (FLOMAX) 0.4 MG CAPS Take 0.8 mg by mouth at bedtime.   Yes Historical Provider, MD  warfarin (COUMADIN) 2 MG tablet Take 2-3 mg by mouth every morning. 2 mg every day except on Saturday and Sunday patient to take 3 mg   Yes Historical Provider, MD   Physical Exam: Filed Vitals:   06/28/14 0645 06/28/14 0700 06/28/14 0730 06/28/14 0745  BP: 177/119 165/109 189/110 180/108  Pulse:  101 110   Temp:      TempSrc:      Resp: 24 25 30 27   Height:      SpO2:  100% 100%     Wt Readings from Last 3 Encounters:  02/15/14 82.9 kg (182 lb 12.2 oz)  08/27/13 74.299 kg (163 lb 12.8 oz)  06/19/13 86.2 kg (190 lb 0.6 oz)    General:  Appears calm and comfortable, pleasantly confused, oriented to self only Eyes: PERRL, normal lids, irises & conjunctiva ENT: grossly normal hearing, lips & tongue Neck: no LAD, masses or thyromegaly Cardiovascular: Tachycardic, irregular rate and rhythm, no m/r/g. Once 2+ lower extremity edema left greater than right. Respiratory: CTA bilaterally, no w/r/r. Normal respiratory effort. Abdomen: soft, ntnd Skin: no rash or induration seen on limited exam Musculoskeletal: Diffuse bilateral lower extremity weakness with drops Psychiatric: Unable to assess Neurologic: Nonfocal, chronic bilateral lower extremity weakness with bilateral foot drops           Labs on Admission:  Basic Metabolic Panel:  Recent Labs Lab 06/28/14 0435  NA 143  K 4.7  CL 107  CO2 20*  GLUCOSE 146*  BUN 22*  CREATININE 1.39*  CALCIUM 9.6   Liver Function Tests: No results for input(s): AST, ALT, ALKPHOS, BILITOT, PROT, ALBUMIN in the last 168 hours. No results for input(s): LIPASE, AMYLASE in the last 168 hours. No results for input(s): AMMONIA in the last 168 hours. CBC:  Recent Labs Lab 06/28/14 0435  WBC 9.4  NEUTROABS 6.7  HGB 12.6*  HCT 39.9  MCV 95.7  PLT 203   Cardiac Enzymes: No results for input(s): CKTOTAL, CKMB, CKMBINDEX, TROPONINI in the last 168  hours.  BNP (last 3 results)  Recent Labs  06/28/14 0435  BNP 100.6*    ProBNP (last 3 results) No results for input(s): PROBNP in the last 8760 hours.  CBG: No results for input(s): GLUCAP in the last 168 hours.  Radiological Exams on Admission: Dg Chest Port 1 View  06/28/2014   CLINICAL DATA:  Shortness of breath.  EXAM: PORTABLE CHEST - 1 VIEW  COMPARISON:  None.  FINDINGS: Shallow inspiration with elevation of the left hemidiaphragm. Heart size and pulmonary vascularity are normal for technique. Mild atelectasis in the lung bases. Circumscribed nodular opacity in the left apex measuring 9 mm. This could represent a rib sclerosis or a pulmonary nodule. Suggest follow-up with CT for further characterization if clinically indicated. Calcified and tortuous aorta. No pneumothorax.  IMPRESSION: Shallow inspiration with probable atelectasis in the lung bases. Indeterminate nodular opacity in the left apex. Consider chest CT further evaluation peer   Electronically Signed   By: Lucienne Capers M.D.   On: 06/28/2014 05:28    EKG: Independently reviewed. A. fib with RVR  Assessment/Plan   Atrial fibrillation with RVR -Continue diltiazem drip -Resumed by mouth metoprolol, continue warfarin  Suspected UTI -Chronic Foley, asked RN to change this -Empiric IV Zosyn for now given history of polymicrobial resistant UTIs  Elevated troponin -Suspect due to demand -Continue Aspirin, metoprolol; cycle cardiac enzymes, check 2-D echo -Cardiology consulted by EDP -Conservative management    Dementia -Stable, continue Namenda -Total care, confusion at baseline    Bladder, atonic -Chronic Foley, change this    History of DVT (deep vein thrombosis) -Continue warfarin  Severe spinal stenosis with bilateral foot drops -Bed bound at baseline  Code Status: Full COde, strongly advised wife and daughter to reconsider this given advanced age, dementia we will discuss this amongst family  again DVT Prophylaxis: On warfarin Family Communication: Wife and daughter bedside Disposition Plan: Admit to stepdown  Time spent: 60min  Liviya Santini [facility] Pager [phone]

## 2014-06-28 NOTE — ED Provider Notes (Signed)
TIME SEEN: 4:30 AM  CHIEF COMPLAINT: Generalized weakness, shortness of breath, hypertension, A. fib with RVR  HPI: Pt is a 81 y.o. male with history of hypertension, paroxysmal atrial fibrillation, DVT on Coumadin, dementia, prostate cancer status post radiation treatment, indwelling Foley catheter who presents to the emergency department with complaints of generalized weakness for the past several days. Wife reports because of this weakness and not feeling well she stopped giving him his medications yesterday. He states he's been feeling short of breath since. On EMS arrival patient was noted to be hypertensive and extremely tachycardic (in the 160s). They state he was mildly hypoxic with sats in the low 90s and was wheezing. He was given 2 albuterol treatments.  He denies any chest pain or chest discomfort. No fever. No vomiting or diarrhea. No cough.  ROS: See HPI Constitutional: no fever  Eyes: no drainage  ENT: no runny nose   Cardiovascular:  no chest pain  Resp:  SOB  GI: no vomiting GU: no dysuria Integumentary: no rash  Allergy: no hives  Musculoskeletal: no leg swelling  Neurological: no slurred speech ROS otherwise negative  PAST MEDICAL HISTORY/PAST SURGICAL HISTORY:  Past Medical History  Diagnosis Date  . Generalized weakness   . Hypokalemia   . Renal disorder   . Hyperlipidemia   . Vitamin D deficiency   . Bell's palsy   . Lumbar stenosis   . Hypertension   . Depression   . Dementia   . Prostate cancer ~ 2004    "had radiation tx" (11/24/2012)  . DVT (deep venous thrombosis) 11/24/2012    Acute DVT in the rightcommon femoral vein and acute DVT involving the leftcommon femorall,profinda popliteal and posterior tibial vein 2013. Pt seen by hematology in 2014. W/S revealed continued clot in left leg  . Asthma     "as a child, real bad" (11/24/2012)  . History of bleeding ulcers ~ 1956    "in hospital for ~ 1 month" (11/24/2012)  . Recurrent UTI (urinary tract  infection) 2013-now    "lots" (11/24/2012)  . DJD (degenerative joint disease)   . Arthritis     "joints" (11/24/2012)  . Anxiety   . Hyperaldosteronism   . CHF (congestive heart failure)     Diastolic dysfunction  . Vitamin B12 deficiency   . Spinal stenosis     Dr Christella Noa  . Dilated aortic root   . Mild aortic stenosis     echo 2012    MEDICATIONS:  Prior to Admission medications   Medication Sig Start Date End Date Taking? Authorizing Provider  acetaminophen (TYLENOL) 500 MG tablet Take 1,000 mg by mouth every 6 (six) hours as needed for moderate pain, fever or headache.    Historical Provider, MD  ALPRAZolam Duanne Moron) 0.5 MG tablet Take 0.5 mg by mouth 3 (three) times daily as needed for anxiety. For anxiety.    Historical Provider, MD  aMILoride (MIDAMOR) 5 MG tablet Take 10 mg by mouth 2 (two) times daily.     Historical Provider, MD  Calcium-Vitamin D (CALTRATE 600 PLUS-VIT D PO) Take 1 tablet by mouth daily.     Historical Provider, MD  cholecalciferol (VITAMIN D) 1000 UNITS tablet Take 1,000 Units by mouth daily.    Historical Provider, MD  citalopram (CELEXA) 20 MG tablet Take 20 mg by mouth daily. 11/11/12   Historical Provider, MD  Cyanocobalamin (VITAMIN B 12 PO) Take 1,000 mg by mouth daily.    Historical Provider, MD  docusate sodium  100 MG CAPS Take 100 mg by mouth 2 (two) times daily. 08/28/13   Bobby Rumpf York, PA-C  ferrous sulfate 324 (65 FE) MG TBEC Take 1 tablet by mouth daily.     Historical Provider, MD  Glucosamine-Chondroit-Vit C-Mn (GLUCOSAMINE CHONDR 1500 COMPLX) CAPS Take 1 capsule by mouth daily.    Historical Provider, MD  memantine (NAMENDA XR) 28 MG CP24 24 hr capsule Take 28 mg by mouth daily.    Historical Provider, MD  metoprolol tartrate (LOPRESSOR) 25 MG tablet Take 0.5 tablets (12.5 mg total) by mouth 2 (two) times daily. 02/15/14 02/15/15  Sahar Heloise Beecham, PA-C  pantoprazole (PROTONIX) 40 MG tablet Take 40 mg by mouth daily.    Historical Provider, MD   potassium chloride SA (K-DUR,KLOR-CON) 20 MEQ tablet Take 20 mEq by mouth 2 (two) times daily.    Historical Provider, MD  Tamsulosin HCl (FLOMAX) 0.4 MG CAPS Take 0.8 mg by mouth at bedtime.    Historical Provider, MD  warfarin (COUMADIN) 2 MG tablet Take 2-3 mg by mouth every morning. 2 mg every day except on Saturday and Sunday patient to take 3 mg    Historical Provider, MD    ALLERGIES:  Allergies  Allergen Reactions  . Contrast Media [Iodinated Diagnostic Agents]     Pt blacked out  . Iohexol      Desc: sob, throat swelling (1988 gdc) pt requires full premeds and does well, JB 8/14/7     SOCIAL HISTORY:  History  Substance Use Topics  . Smoking status: Former Smoker -- 10 years    Types: Cigarettes  . Smokeless tobacco: Never Used     Comment: 11/24/2012 "quit smoking cigarettes in ~ 1956"  . Alcohol Use: Yes     Comment: occasoinal wine    FAMILY HISTORY: Family History  Problem Relation Age of Onset  . Heart disease Mother   . Diabetes Father     EXAM: BP 180/108 mmHg  Pulse 110  Temp(Src) 97.7 F (36.5 C) (Oral)  Resp 27  Ht 5\' 11"  (1.803 m)  SpO2 100% CONSTITUTIONAL: Alert and oriented and responds appropriately to questions.well-nourished, elderly, chronically ill-appearing, in no distress HEAD: Normocephalic EYES: Conjunctivae clear, PERRL ENT: normal nose; no rhinorrhea; moist mucous membranes; pharynx without lesions noted NECK: Supple, no meningismus, no LAD  CARD: Irregularly irregular and tachycardic, S1 and S2 appreciated; no murmurs, no clicks, no rubs, no gallops RESP: Normal chest excursion without splinting or tachypnea; breath sounds clear and equal bilaterally; no wheezes, no rhonchi, no rales, no hypoxia or respiratory distress, speaking full sentences ABD/GI: Normal bowel sounds; non-distended; soft, non-tender, no rebound, no guarding, no peritoneal signs BACK:  The back appears normal and is non-tender to palpation, there is no CVA  tenderness EXT: Normal ROM in all joints; non-tender to palpation; no edema; normal capillary refill; no cyanosis, no calf tenderness or swelling    SKIN: Normal color for age and race; warm NEURO: Moves upper extremities equally, contractures to his bilateral lower extremities which are chronic secondary to lumbar stenosis PSYCH: The patient's mood and manner are appropriate. Grooming and personal hygiene are appropriate.  MEDICAL DECISION MAKING: Patient here with hypertension, A. fib with RVR likely from not being on his metoprolol. EMS states he was wheezing with mild hypoxia. Was given albuterol and Solu-Medrol by EMS. They report his heart rate was 160 before receiving albuterol. Will obtain labs, urine, give diltiazem, aspirin.  ED PROGRESS: Heart rate improving on diltiazem. His labs show  a mildly elevated troponin which is likely secondary to strain. He reports his shortness of breath is improving but not gone. He does appear to have a urinary tract infection and has grown Pseudomonas in the past. We'll give Zosyn and send urine culture.   Patient is now in sinus tachycardia on 10mg  of diltiazem 1 hour. Discussed with Dr. Burt Knack with cardiology who will see the patient in consult and agrees with medicine admission. Discussed with hospitalist team for admission to step down bed.     EKG Interpretation  Date/Time:  Thursday June 28 2014 04:28:05 EDT Ventricular Rate:  147 PR Interval:    QRS Duration: 88 QT Interval:  316 QTC Calculation: 494 R Axis:   -42 Text Interpretation:  Atrial fibrillation with rapid V-rate Left anterior fascicular block Abnormal R-wave progression, early transition Left ventricular hypertrophy Confirmed by WARD,  DO, KRISTEN [phone]) on 06/28/2014 4:36:06 AM         EKG Interpretation  Date/Time:  Thursday June 28 2014 06:35:05 EDT Ventricular Rate:  104 PR Interval:  205 QRS Duration: 83 QT Interval:  346 QTC Calculation: 455 R Axis:   -15 Text  Interpretation:  Sinus tachycardia Atrial premature complex Abnormal R-wave progression, early transition Left ventricular hypertrophy Confirmed by WARD,  DO, KRISTEN ([id number]) on 06/28/2014 6:42:22 AM         CRITICAL CARE Performed by: Nyra Jabs   Total critical care time: 45 minutes - A. fib with RVR on diltiazem drip  Critical care time was exclusive of separately billable procedures and treating other patients.  Critical care was necessary to treat or prevent imminent or life-threatening deterioration.  Critical care was time spent personally by me on the following activities: development of treatment plan with patient and/or surrogate as well as nursing, discussions with consultants, evaluation of patient's response to treatment, examination of patient, obtaining history from patient or surrogate, ordering and performing treatments and interventions, ordering and review of laboratory studies, ordering and review of radiographic studies, pulse oximetry and re-evaluation of patient's condition.      [place], DO 06/28/14 [phone]

## 2014-06-28 NOTE — ED Notes (Signed)
Cardiology at bedside.

## 2014-06-28 NOTE — Progress Notes (Signed)
ANTICOAGULATION CONSULT NOTE - Initial Consult  Pharmacy Consult for Warfarin Indication: atrial fibrillation  Allergies  Allergen Reactions  . Contrast Media [Iodinated Diagnostic Agents]     Pt blacked out  . Iohexol      Desc: sob, throat swelling (1988 gdc) pt requires full premeds and does well, JB 8/14/7     Patient Measurements: Height: 5\' 7"  (170.2 cm) Weight: 158 lb 6.4 oz (71.85 kg) IBW/kg (Calculated) : 66.1 Heparin Dosing Weight:   Vital Signs: Temp: 97.7 F (36.5 C) (06/16 0800) Temp Source: Oral (06/16 0435) BP: 187/105 mmHg (06/16 0800) Pulse Rate: 110 (06/16 0730)  Labs:  Recent Labs  06/28/14 0435  HGB 12.6*  HCT 39.9  PLT 203  LABPROT 24.6*  INR 2.25*  CREATININE 1.39*    Estimated Creatinine Clearance: 34.3 mL/min (by C-G formula based on Cr of 1.39).   Medical History: Past Medical History  Diagnosis Date  . Generalized weakness   . Hypokalemia   . Renal disorder   . Hyperlipidemia   . Vitamin D deficiency   . Bell's palsy   . Lumbar stenosis   . Hypertension   . Depression   . Dementia   . Prostate cancer ~ 2004    "had radiation tx" (11/24/2012)  . DVT (deep venous thrombosis) 11/24/2012    Acute DVT in the rightcommon femoral vein and acute DVT involving the leftcommon femorall,profinda popliteal and posterior tibial vein 2013. Pt seen by hematology in 2014. W/S revealed continued clot in left leg  . Asthma     "as a child, real bad" (11/24/2012)  . History of bleeding ulcers ~ 1956    "in hospital for ~ 1 month" (11/24/2012)  . Recurrent UTI (urinary tract infection) 2013-now    "lots" (11/24/2012)  . DJD (degenerative joint disease)   . Arthritis     "joints" (11/24/2012)  . Anxiety   . Hyperaldosteronism   . CHF (congestive heart failure)     Diastolic dysfunction  . Vitamin B12 deficiency   . Spinal stenosis     Dr Christella Noa  . Dilated aortic root   . Mild aortic stenosis     echo 2012  . Atrial fibrillation      Medications:  Prescriptions prior to admission  Medication Sig Dispense Refill Last Dose  . acetaminophen (TYLENOL) 500 MG tablet Take 1,000 mg by mouth every 6 (six) hours as needed for moderate pain, fever or headache.   06/27/2014 at Unknown time  . ALPRAZolam (XANAX) 0.5 MG tablet Take 0.5 mg by mouth 3 (three) times daily as needed for anxiety. For anxiety.   06/27/2014 at Unknown time  . aMILoride (MIDAMOR) 5 MG tablet Take 10 mg by mouth 2 (two) times daily.    06/27/2014 at Unknown time  . Calcium-Vitamin D (CALTRATE 600 PLUS-VIT D PO) Take 1 tablet by mouth daily.    06/27/2014 at Unknown time  . cholecalciferol (VITAMIN D) 1000 UNITS tablet Take 1,000 Units by mouth daily.   06/27/2014 at Unknown time  . citalopram (CELEXA) 20 MG tablet Take 20 mg by mouth daily.   06/27/2014 at Unknown time  . Cyanocobalamin (VITAMIN B 12 PO) Take 1,000 mg by mouth daily.   06/27/2014 at Unknown time  . docusate sodium 100 MG CAPS Take 100 mg by mouth 2 (two) times daily. 10 capsule 0 06/27/2014 at Unknown time  . ferrous sulfate 324 (65 FE) MG TBEC Take 1 tablet by mouth daily.    06/27/2014 at  Unknown time  . Glucosamine-Chondroit-Vit C-Mn (GLUCOSAMINE CHONDR 1500 COMPLX) CAPS Take 1 capsule by mouth daily.   06/27/2014 at Unknown time  . memantine (NAMENDA XR) 28 MG CP24 24 hr capsule Take 28 mg by mouth daily.   06/27/2014 at Unknown time  . metoprolol tartrate (LOPRESSOR) 25 MG tablet Take 0.5 tablets (12.5 mg total) by mouth 2 (two) times daily. 60 tablet 11 06/27/2014 at Unknown time  . pantoprazole (PROTONIX) 40 MG tablet Take 40 mg by mouth daily.   06/27/2014 at Unknown time  . potassium chloride SA (K-DUR,KLOR-CON) 20 MEQ tablet Take 20 mEq by mouth 2 (two) times daily.   06/27/2014 at Unknown time  . Tamsulosin HCl (FLOMAX) 0.4 MG CAPS Take 0.8 mg by mouth at bedtime.   06/27/2014 at Unknown time  . warfarin (COUMADIN) 2 MG tablet Take 2-3 mg by mouth every morning. 2 mg every day except on Saturday  and Sunday patient to take 3 mg   06/27/2014 at Unknown time   Scheduled:  . aMILoride  10 mg Oral BID  . [START ON 06/29/2014] aspirin  324 mg Oral Daily  . cholecalciferol  1,000 Units Oral Daily  . citalopram  20 mg Oral Daily  . docusate sodium  100 mg Oral BID  . ferrous sulfate  1 tablet Oral Daily  . memantine  28 mg Oral Daily  . metoprolol tartrate  12.5 mg Oral BID  . pantoprazole  40 mg Oral Daily  . tamsulosin  0.8 mg Oral QHS   Infusions:  . diltiazem (CARDIZEM) infusion 10 mg/hr (06/28/14 0539)    Assessment: 81yo male with history of dementia, PAF and DVT on coumadin PTA presents with SOB. Pharmacy is consulted to dose warfarin for atrial fibrillation. INR 2.25, Hgb 12.6, Plt 203.  PTA Warfarin dose: 3mg  Sat/Sun and 2mg  AODs  Goal of Therapy:  INR 2-3 Monitor platelets by anticoagulation protocol: Yes   Plan:  Warfarin 2mg  tonight x1 Daily INR/CBC  Andrey Cota. Diona Foley, PharmD Clinical Pharmacist Pager [phone] 06/28/2014,9:02 AM

## 2014-06-28 NOTE — ED Notes (Signed)
Pt arrives via EMS from home with c/o AFIB with RVR, hx of same. Pt's wife states she held his metoprolol because "he was feeling bad," weakness. Denies nausea. EMS gave 5MG  albuterol and 125 mg solumedrol.

## 2014-06-29 DIAGNOSIS — R7989 Other specified abnormal findings of blood chemistry: Secondary | ICD-10-CM

## 2014-06-29 DIAGNOSIS — N390 Urinary tract infection, site not specified: Secondary | ICD-10-CM

## 2014-06-29 DIAGNOSIS — F0390 Unspecified dementia without behavioral disturbance: Secondary | ICD-10-CM

## 2014-06-29 DIAGNOSIS — I482 Chronic atrial fibrillation: Secondary | ICD-10-CM

## 2014-06-29 DIAGNOSIS — N312 Flaccid neuropathic bladder, not elsewhere classified: Secondary | ICD-10-CM

## 2014-06-29 LAB — CBC
HCT: 33.1 % — ABNORMAL LOW (ref 39.0–52.0)
Hemoglobin: 10.5 g/dL — ABNORMAL LOW (ref 13.0–17.0)
MCH: 30 pg (ref 26.0–34.0)
MCHC: 31.7 g/dL (ref 30.0–36.0)
MCV: 94.6 fL (ref 78.0–100.0)
PLATELETS: 144 10*3/uL — AB (ref 150–400)
RBC: 3.5 MIL/uL — ABNORMAL LOW (ref 4.22–5.81)
RDW: 15.2 % (ref 11.5–15.5)
WBC: 7.8 10*3/uL (ref 4.0–10.5)

## 2014-06-29 LAB — TROPONIN I: Troponin I: 0.53 ng/mL (ref <0.031)

## 2014-06-29 LAB — URINE CULTURE

## 2014-06-29 LAB — BASIC METABOLIC PANEL
Anion gap: 10 (ref 5–15)
BUN: 28 mg/dL — ABNORMAL HIGH (ref 6–20)
CO2: 23 mmol/L (ref 22–32)
Calcium: 9.1 mg/dL (ref 8.9–10.3)
Chloride: 108 mmol/L (ref 101–111)
Creatinine, Ser: 1.38 mg/dL — ABNORMAL HIGH (ref 0.61–1.24)
GFR calc Af Amer: 51 mL/min — ABNORMAL LOW (ref >60)
GFR calc non Af Amer: 44 mL/min — ABNORMAL LOW (ref >60)
GLUCOSE: 125 mg/dL — AB (ref 65–99)
POTASSIUM: 4.7 mmol/L (ref 3.5–5.1)
SODIUM: 141 mmol/L (ref 135–145)

## 2014-06-29 LAB — PROTIME-INR
INR: 2.59 — ABNORMAL HIGH (ref 0.00–1.49)
PROTHROMBIN TIME: 27.4 s — AB (ref 11.6–15.2)

## 2014-06-29 MED ORDER — HYDRALAZINE HCL 20 MG/ML IJ SOLN
5.0000 mg | Freq: Once | INTRAMUSCULAR | Status: AC
  Administered 2014-06-29: 5 mg via INTRAVENOUS
  Filled 2014-06-29: qty 1

## 2014-06-29 MED ORDER — WARFARIN SODIUM 2 MG PO TABS
2.0000 mg | ORAL_TABLET | Freq: Once | ORAL | Status: DC
  Filled 2014-06-29: qty 1

## 2014-06-29 MED ORDER — WARFARIN SODIUM 1 MG PO TABS
1.0000 mg | ORAL_TABLET | Freq: Once | ORAL | Status: AC
  Administered 2014-06-29: 1 mg via ORAL
  Filled 2014-06-29: qty 1

## 2014-06-29 MED ORDER — AMLODIPINE BESYLATE 5 MG PO TABS
5.0000 mg | ORAL_TABLET | Freq: Every day | ORAL | Status: DC
  Administered 2014-06-29 – 2014-06-30 (×2): 5 mg via ORAL
  Filled 2014-06-29 (×2): qty 1

## 2014-06-29 NOTE — Progress Notes (Signed)
[facility] PROGRESS NOTE  FINDLAY DAGHER XFG:[id number] DOB: Jun 05, 1923 DOA: 06/28/2014  PCP: Kandice Hams, MD  Brief HPI: 81 year old African-American male with a past medical history of dementia, paroxysmal atrial fibrillation, history of DVT, on chronic Coumadin therapy, history of prostate cancer with chronic Foley catheter, presented with shortness of breath. He was found to be tachycardic and was noted to have atrial fibrillation with RVR. Patient was admitted to the hospital for further management.  Past medical history:  Past Medical History  Diagnosis Date  . Generalized weakness   . Hypokalemia   . Renal disorder   . Hyperlipidemia   . Vitamin D deficiency   . Bell's palsy   . Lumbar stenosis   . Hypertension   . Depression   . Dementia   . Prostate cancer ~ 2004    "had radiation tx" (11/24/2012)  . DVT (deep venous thrombosis) 11/24/2012    Acute DVT in the rightcommon femoral vein and acute DVT involving the leftcommon femorall,profinda popliteal and posterior tibial vein 2013. Pt seen by hematology in 2014. W/S revealed continued clot in left leg  . Asthma     "as a child, real bad" (11/24/2012)  . History of bleeding ulcers ~ 1956    "in hospital for ~ 1 month" (11/24/2012)  . Recurrent UTI (urinary tract infection) 2013-now    "lots" (11/24/2012)  . DJD (degenerative joint disease)   . Arthritis     "joints" (11/24/2012)  . Anxiety   . Hyperaldosteronism   . CHF (congestive heart failure)     Diastolic dysfunction  . Vitamin B12 deficiency   . Spinal stenosis     Dr Christella Noa  . Dilated aortic root   . Mild aortic stenosis     echo 2012  . Atrial fibrillation     Consultants: Cardiology  Procedures:  2-D echocardiogram Study Conclusions - Left ventricle: The cavity size was normal. Wall thickness wasincreased in a pattern of severe LVH. Systolic function wasnormal. The estimated ejection fraction was in the range of 55%to 60%. Doppler  parameters are consistent with abnormal leftventricular relaxation (grade 1 diastolic dysfunction). Dopplerparameters are consistent with elevated ventricular end-diastolicfilling pressure. - Aortic valve: There was mild stenosis. Valve area (VTI): 1.38cm^2. Valve area (Vmax): 1.29 cm^2. Valve area (Vmean): 1.3 cm^2. - Mitral valve: Calcified annulus. Mildly thickened leaflets . - Left atrium: The atrium was mildly dilated.  Antibiotics: On Zosyn  Subjective: Patient has dementia and is pleasantly confused. He denies any pain. His wife is at bedside.  Objective: Vital Signs  Filed Vitals:   06/28/14 2236 06/28/14 2318 06/29/14 0235 06/29/14 0400  BP: 124/66 134/72  153/72  Pulse:  55  52  Temp:  98.1 F (36.7 C)  97.2 F (36.2 C)  TempSrc:  Oral  Oral  Resp:  19  18  Height:   5\' 7"  (1.702 m)   Weight:   71.8 kg (158 lb 4.6 oz)   SpO2:  100%  100%    Intake/Output Summary (Last 24 hours) at 06/29/14 9678 Last data filed at 06/29/14 0600  Gross per 24 hour  Intake 4617.75 ml  Output    850 ml  Net 3767.75 ml   Filed Weights   06/28/14 0800 06/29/14 0235  Weight: 71.85 kg (158 lb 6.4 oz) 71.8 kg (158 lb 4.6 oz)    General appearance: alert, appears stated age, distracted and no distress Head: Normocephalic, without obvious abnormality, atraumatic Resp: clear to auscultation bilaterally Cardio: S1,  S2, is bradycardic, regular. No S3, S4. Systolic murmur appreciated over the apex. GI: soft, non-tender; bowel sounds normal; no masses,  no organomegaly Extremities: extremities normal, atraumatic, no cyanosis or edema Neurologic: Pleasantly confused. No other focal deficits. Patient does have a history of lumbar stenosis and has limited mobility of his lower legs.  Lab Results:  Basic Metabolic Panel:  Recent Labs Lab 06/28/14 0435 06/29/14 0258  NA 143 141  K 4.7 4.7  CL 107 108  CO2 20* 23  GLUCOSE 146* 125*  BUN 22* 28*  CREATININE 1.39* 1.38*  CALCIUM  9.6 9.1   CBC:  Recent Labs Lab 06/28/14 0435 06/29/14 0258  WBC 9.4 7.8  NEUTROABS 6.7  --   HGB 12.6* 10.5*  HCT 39.9 33.1*  MCV 95.7 94.6  PLT 203 144*   Cardiac Enzymes:  Recent Labs Lab 06/28/14 0957 06/28/14 1618 06/28/14 2227  TROPONINI 0.75* 0.84* 0.53*   BNP (last 3 results)  Recent Labs  06/28/14 0435  BNP 100.6*     Recent Results (from the past 240 hour(s))  MRSA PCR Screening     Status: Abnormal   Collection Time: 06/28/14  9:01 AM  Result Value Ref Range Status   MRSA by PCR POSITIVE (A) NEGATIVE Final    Comment:        The GeneXpert MRSA Assay (FDA approved for NASAL specimens only), is one component of a comprehensive MRSA colonization surveillance program. It is not intended to diagnose MRSA infection nor to guide or monitor treatment for MRSA infections. RESULT CALLED TO, READ BACK BY AND VERIFIED WITH: Nichola Sizer RN 11:00 06/28/14 ([email or web address])       Studies/Results: Dg Chest Port 1 View  06/28/2014   CLINICAL DATA:  Shortness of breath.  EXAM: PORTABLE CHEST - 1 VIEW  COMPARISON:  None.  FINDINGS: Shallow inspiration with elevation of the left hemidiaphragm. Heart size and pulmonary vascularity are normal for technique. Mild atelectasis in the lung bases. Circumscribed nodular opacity in the left apex measuring 9 mm. This could represent a rib sclerosis or a pulmonary nodule. Suggest follow-up with CT for further characterization if clinically indicated. Calcified and tortuous aorta. No pneumothorax.  IMPRESSION: Shallow inspiration with probable atelectasis in the lung bases. Indeterminate nodular opacity in the left apex. Consider chest CT further evaluation peer   Electronically Signed   By: Lucienne Capers M.D.   On: 06/28/2014 05:28    Medications:  Scheduled: . amLODipine  5 mg Oral Daily  . aspirin  324 mg Oral Daily  . Chlorhexidine Gluconate Cloth  6 each Topical Q0600  . cholecalciferol  1,000 Units Oral Daily  . citalopram   20 mg Oral Daily  . docusate sodium  100 mg Oral BID  . ferrous sulfate  325 mg Oral Q breakfast  . memantine  28 mg Oral Daily  . metoprolol tartrate  12.5 mg Oral BID  . mupirocin ointment  1 application Nasal BID  . pantoprazole  40 mg Oral Q1200  . piperacillin-tazobactam (ZOSYN)  IV  3.375 g Intravenous Q8H  . tamsulosin  0.8 mg Oral QHS  . warfarin  1 mg Oral ONCE-1800  . Warfarin - Pharmacist Dosing Inpatient   Does not apply q1800   Continuous:  SLH:[id number] **OR** acetaminophen, ALPRAZolam, ondansetron **OR** ondansetron (ZOFRAN) IV  Assessment/Plan:  Active Problems:   Lumbar stenosis   Dementia   Bladder, atonic   History of DVT (deep vein thrombosis)   PAF (paroxysmal atrial fibrillation)  Atrial fibrillation with RVR   Elevated troponin   Chronic indwelling Foley catheter   Chronic atrial fibrillation    Atrial fibrillation with RVR Patient has converted to sinus rhythm. Followed by cardiology. Continue beta blocker. Continue warfarin per pharmacy.  Suspected UTI Continue current antibiotics. Await urine culture reports. He has a chronic Foley, which was changed at the time of admission. He is followed by Dr. Janice Norrie.  Elevated troponin Likely due to demand ischemia from rapid heart rate. Seen by cardiology. Patient underwent echocardiogram as mentioned above. Considering his other comorbidities, especially his dementia and poor baseline functioning, Patient is not a candidate for further investigations.  History of essential hypertension Blood pressure is poorly controlled. He is noted to be on diuretics at home. However, his BUN and creatinine slightly elevated. Initiate amlodipine instead.  History of dementia Stable. Pleasantly confused. Continue Namenda.  History of atonic bladder Continue Foley  History of DVT Continue warfarin  History of severe spinal stenosis with bilateral foot drop He is bedbound at baseline.  DVT Prophylaxis: On  warfarin    Code Status: Full code  Family Communication: Discussed with his wife at bedside  Disposition Plan: He'll return home when improved.     LOS: 1 day   [facility] Pager [phone] 06/29/2014, 7:28 AM  If 7PM-7AM, please contact night-coverage at [email or web address], password [facility]

## 2014-06-29 NOTE — Progress Notes (Signed)
Patient Name: Joshua Rodgers Date of Encounter: 06/29/2014  Active Problems:   Lumbar stenosis   Dementia   Bladder, atonic   History of DVT (deep vein thrombosis)   PAF (paroxysmal atrial fibrillation)   Atrial fibrillation with RVR   Elevated troponin   Chronic indwelling Foley catheter   Chronic atrial fibrillation   Primary Cardiologist: Dr Radford Pax  Patient Profile: 81 yo male w/ hx D-CHF and PAF on warfarin, dementia, spinal stenosis in a wheelchair was admitted 06/16 w/ SOB, UTI, cards seeing for rapid afib, elevated trop.  SUBJECTIVE: Denies chest pain or SOB, no palpitations, feels pretty well.  OBJECTIVE Filed Vitals:   06/28/14 2318 06/29/14 0235 06/29/14 0400 06/29/14 0800  BP: 134/72  153/72 160/78  Pulse: 55  52 52  Temp: 98.1 F (36.7 C)  97.2 F (36.2 C) 97.1 F (36.2 C)  TempSrc: Oral  Oral Axillary  Resp: 19  18 21   Height:  5\' 7"  (1.702 m)    Weight:  158 lb 4.6 oz (71.8 kg)    SpO2: 100%  100% 100%    Intake/Output Summary (Last 24 hours) at 06/29/14 0856 Last data filed at 06/29/14 0855  Gross per 24 hour  Intake 4857.75 ml  Output    850 ml  Net 4007.75 ml   Filed Weights   06/28/14 0800 06/29/14 0235  Weight: 158 lb 6.4 oz (71.85 kg) 158 lb 4.6 oz (71.8 kg)    PHYSICAL EXAM General: Well developed, well nourished, male in no acute distress. Head: Normocephalic, atraumatic.  Neck: Supple without bruits, JVD not elevated. Lungs:  Resp regular and unlabored, decreased BS bases, poor effort unless stimulated. Heart: RRR, S1, S2, no S3, S4, ?AS murmur; no rub. Abdomen: Soft, non-tender, non-distended, BS + x 4.  Extremities: No clubbing, cyanosis, edema.  Neuro: Alert and oriented X 2. Moves all extremities spontaneously. Psych: Normal affect.  LABS: CBC: Recent Labs  06/28/14 0435 06/29/14 0258  WBC 9.4 7.8  NEUTROABS 6.7  --   HGB 12.6* 10.5*  HCT 39.9 33.1*  MCV 95.7 94.6  PLT 203 144*   INR: Recent Labs   06/29/14 0258  INR 7.41*   Basic Metabolic Panel: Recent Labs  06/28/14 0435 06/29/14 0258  NA 143 141  K 4.7 4.7  CL 107 108  CO2 20* 23  GLUCOSE 146* 125*  BUN 22* 28*  CREATININE 1.39* 1.38*  CALCIUM 9.6 9.1    Cardiac Enzymes: Recent Labs  06/28/14 0957 06/28/14 1618 06/28/14 2227  TROPONINI 0.75* 0.84* 0.53*    Recent Labs  06/28/14 0456  TROPIPOC 0.24*   BNP:  B NATRIURETIC PEPTIDE  Date/Time Value Ref Range Status  06/28/2014 04:35 AM 100.6* 0.0 - 100.0 pg/mL Final   Thyroid Function Tests: Recent Labs  06/28/14 0448  TSH 2.218    TELE: SBrady, converted before he got to the floor.       Radiology/Studies: Dg Chest Port 1 View 06/28/2014   CLINICAL DATA:  Shortness of breath.  EXAM: PORTABLE CHEST - 1 VIEW  COMPARISON:  None.  FINDINGS: Shallow inspiration with elevation of the left hemidiaphragm. Heart size and pulmonary vascularity are normal for technique. Mild atelectasis in the lung bases. Circumscribed nodular opacity in the left apex measuring 9 mm. This could represent a rib sclerosis or a pulmonary nodule. Suggest follow-up with CT for further characterization if clinically indicated. Calcified and tortuous aorta. No pneumothorax.  IMPRESSION: Shallow inspiration with probable atelectasis in  the lung bases. Indeterminate nodular opacity in the left apex. Consider chest CT further evaluation peer   Electronically Signed   By: Lucienne Capers M.D.   On: 06/28/2014 05:28    Current Medications:  . amLODipine  5 mg Oral Daily  . aspirin  324 mg Oral Daily  . Chlorhexidine Gluconate Cloth  6 each Topical Q0600  . cholecalciferol  1,000 Units Oral Daily  . citalopram  20 mg Oral Daily  . docusate sodium  100 mg Oral BID  . ferrous sulfate  325 mg Oral Q breakfast  . memantine  28 mg Oral Daily  . metoprolol tartrate  12.5 mg Oral BID  . mupirocin ointment  1 application Nasal BID  . pantoprazole  40 mg Oral Q1200  . piperacillin-tazobactam  (ZOSYN)  IV  3.375 g Intravenous Q8H  . tamsulosin  0.8 mg Oral QHS  . warfarin  2 mg Oral ONCE-1800  . Warfarin - Pharmacist Dosing Inpatient   Does not apply q1800      ASSESSMENT AND PLAN:   Atrial fibrillation with RVR - spont conversion to SR - continue BB at low dose, heart rate is stable in the 50s -  TSH ok    Elevated troponin - feel demand ischemia in the setting of rapid afib and acute illness - no c/o chest pain - conservative mgt recommended  Otherwise, per IM, we will see PRN Active Problems:   Lumbar stenosis   Dementia   Bladder, atonic   History of DVT (deep vein thrombosis)   PAF (paroxysmal atrial fibrillation)   Chronic indwelling Foley catheter   Chronic atrial fibrillation - currently SR   Signed, Barrett, Rhonda , PA-C 8:56 AM 06/29/2014 I have seen and examined the patient along with Barrett, Rhonda , PA-C.  I have reviewed the chart, notes and new data.  I agree with PA's note.  Mild sinus bradycardia after spontaneous conversion to NSR. Continue low dose beta blocker and warfarin. No plans for additional workup on this admission. Please call with additional questions.  Sanda Klein, MD, [place] [phone] 06/29/2014, 11:41 AM

## 2014-06-29 NOTE — Progress Notes (Signed)
Report called and given to [facility], Therapist, sports. RN attempted X3 to contact family but no response. Patient safely transported to 2W with RN and NT. Nego, RN at bedside.

## 2014-06-29 NOTE — Progress Notes (Signed)
ANTICOAGULATION CONSULT NOTE - Initial Consult  Pharmacy Consult for Warfarin Indication: atrial fibrillation  Allergies  Allergen Reactions  . Contrast Media [Iodinated Diagnostic Agents]     Pt blacked out  . Iohexol      Desc: sob, throat swelling (1988 gdc) pt requires full premeds and does well, JB 8/14/7     Patient Measurements: Height: 5\' 7"  (170.2 cm) Weight: 158 lb 4.6 oz (71.8 kg) IBW/kg (Calculated) : 66.1 Heparin Dosing Weight:   Vital Signs: Temp: 97.1 F (36.2 C) (06/17 0800) Temp Source: Axillary (06/17 0800) BP: 160/78 mmHg (06/17 0800) Pulse Rate: 52 (06/17 0800)  Labs:  Recent Labs  06/28/14 0435 06/28/14 0957 06/28/14 1618 06/28/14 2227 06/29/14 0258  HGB 12.6*  --   --   --  10.5*  HCT 39.9  --   --   --  33.1*  PLT 203  --   --   --  144*  LABPROT 24.6*  --   --   --  27.4*  INR 2.25*  --   --   --  2.59*  CREATININE 1.39*  --   --   --  1.38*  TROPONINI  --  0.75* 0.84* 0.53*  --     Estimated Creatinine Clearance: 34.6 mL/min (by C-G formula based on Cr of 1.38).   Medical History: Past Medical History  Diagnosis Date  . Generalized weakness   . Hypokalemia   . Renal disorder   . Hyperlipidemia   . Vitamin D deficiency   . Bell's palsy   . Lumbar stenosis   . Hypertension   . Depression   . Dementia   . Prostate cancer ~ 2004    "had radiation tx" (11/24/2012)  . DVT (deep venous thrombosis) 11/24/2012    Acute DVT in the rightcommon femoral vein and acute DVT involving the leftcommon femorall,profinda popliteal and posterior tibial vein 2013. Pt seen by hematology in 2014. W/S revealed continued clot in left leg  . Asthma     "as a child, real bad" (11/24/2012)  . History of bleeding ulcers ~ 1956    "in hospital for ~ 1 month" (11/24/2012)  . Recurrent UTI (urinary tract infection) 2013-now    "lots" (11/24/2012)  . DJD (degenerative joint disease)   . Arthritis     "joints" (11/24/2012)  . Anxiety   .  Hyperaldosteronism   . CHF (congestive heart failure)     Diastolic dysfunction  . Vitamin B12 deficiency   . Spinal stenosis     Dr Christella Noa  . Dilated aortic root   . Mild aortic stenosis     echo 2012  . Atrial fibrillation     Medications:  Prescriptions prior to admission  Medication Sig Dispense Refill Last Dose  . acetaminophen (TYLENOL) 500 MG tablet Take 1,000 mg by mouth every 6 (six) hours as needed for moderate pain, fever or headache.   06/27/2014 at Unknown time  . ALPRAZolam (XANAX) 0.5 MG tablet Take 0.5 mg by mouth 3 (three) times daily as needed for anxiety. For anxiety.   06/27/2014 at Unknown time  . aMILoride (MIDAMOR) 5 MG tablet Take 10 mg by mouth 2 (two) times daily.    06/27/2014 at Unknown time  . Calcium-Vitamin D (CALTRATE 600 PLUS-VIT D PO) Take 1 tablet by mouth daily.    06/27/2014 at Unknown time  . cholecalciferol (VITAMIN D) 1000 UNITS tablet Take 1,000 Units by mouth daily.   06/27/2014 at Unknown time  .  citalopram (CELEXA) 20 MG tablet Take 20 mg by mouth daily.   06/27/2014 at Unknown time  . Cyanocobalamin (VITAMIN B 12 PO) Take 1,000 mg by mouth daily.   06/27/2014 at Unknown time  . docusate sodium 100 MG CAPS Take 100 mg by mouth 2 (two) times daily. 10 capsule 0 06/27/2014 at Unknown time  . ferrous sulfate 324 (65 FE) MG TBEC Take 1 tablet by mouth daily.    06/27/2014 at Unknown time  . Glucosamine-Chondroit-Vit C-Mn (GLUCOSAMINE CHONDR 1500 COMPLX) CAPS Take 1 capsule by mouth daily.   06/27/2014 at Unknown time  . memantine (NAMENDA XR) 28 MG CP24 24 hr capsule Take 28 mg by mouth daily.   06/27/2014 at Unknown time  . metoprolol tartrate (LOPRESSOR) 25 MG tablet Take 0.5 tablets (12.5 mg total) by mouth 2 (two) times daily. 60 tablet 11 06/27/2014 at Unknown time  . pantoprazole (PROTONIX) 40 MG tablet Take 40 mg by mouth daily.   06/27/2014 at Unknown time  . potassium chloride SA (K-DUR,KLOR-CON) 20 MEQ tablet Take 20 mEq by mouth 2 (two) times daily.    06/27/2014 at Unknown time  . Tamsulosin HCl (FLOMAX) 0.4 MG CAPS Take 0.8 mg by mouth at bedtime.   06/27/2014 at Unknown time  . warfarin (COUMADIN) 2 MG tablet Take 2-3 mg by mouth every morning. 2 mg every day except on Saturday and Sunday patient to take 3 mg   06/27/2014 at Unknown time   Scheduled:  . amLODipine  5 mg Oral Daily  . aspirin  324 mg Oral Daily  . Chlorhexidine Gluconate Cloth  6 each Topical Q0600  . cholecalciferol  1,000 Units Oral Daily  . citalopram  20 mg Oral Daily  . docusate sodium  100 mg Oral BID  . ferrous sulfate  325 mg Oral Q breakfast  . memantine  28 mg Oral Daily  . metoprolol tartrate  12.5 mg Oral BID  . mupirocin ointment  1 application Nasal BID  . pantoprazole  40 mg Oral Q1200  . piperacillin-tazobactam (ZOSYN)  IV  3.375 g Intravenous Q8H  . tamsulosin  0.8 mg Oral QHS  . Warfarin - Pharmacist Dosing Inpatient   Does not apply q1800   Infusions:     Assessment: 81yo male with history of dementia, PAF and DVT on coumadin PTA presents with SOB. Pharmacy is consulted to dose warfarin for atrial fibrillation. INR 2.59, Hgb 10.5, Plt 144.  PTA Warfarin dose: 3mg  Sat/Sun and 2mg  AODs  Goal of Therapy:  INR 2-3 Monitor platelets by anticoagulation protocol: Yes   Plan:  Warfarin 2mg  tonight x1 Daily INR/CBC Monitor s/sx of bleeding  Andrey Cota. Diona Foley, PharmD Clinical Pharmacist Pager [phone] 06/29/2014,8:22 AM

## 2014-06-29 NOTE — Progress Notes (Signed)
Utilization Review Completed.  

## 2014-06-29 NOTE — Progress Notes (Addendum)
RN AND WIFE ATTEMPTED FOR 15 MINUTES TO GET PATIENT TO TAKE H.S. MEDS, BUT PATIENT WOULD NOT COMPLY. CONFUSED AND RAMBLING ABOUT LIGHTS AND WATER SYSTEM.  PRESSURE TONIGHT 158/103. IV HYDRALAZINE ORDERED BY N-P THOMAS CALLAHAN SINCE PATIENT WOULD NOT TAKE PO METOPROLOL.

## 2014-06-29 NOTE — Progress Notes (Signed)
ANTICOAGULATION CONSULT NOTE - Follow Up  Pharmacy Consult for Warfarin Indication: atrial fibrillation Labs:  Recent Labs  06/28/14 0435 06/28/14 0957 06/28/14 1618 06/28/14 2227 06/29/14 0258  HGB 12.6*  --   --   --  10.5*  HCT 39.9  --   --   --  33.1*  PLT 203  --   --   --  144*  LABPROT 24.6*  --   --   --  27.4*  INR 2.25*  --   --   --  2.59*  CREATININE 1.39*  --   --   --  1.38*  TROPONINI  --  0.75* 0.84* 0.53*  --    Assessment: 81 yo F admitted 06/28/2014 SOB. Pharmacy is consulted to dose warfarin for atrial fibrillation  PMH: dementia, PAF and DVT on coumadin PTA   AC: atrial fibrillation.  INR at goal,  PTA Warfarin dose: 3mg  Sat/Sun and 2mg  AODs  Goal of Therapy:  INR 2-3 Monitor platelets by anticoagulation protocol: Yes   Plan:  Warfarin 1mg  tonight x1, as on concurrent antibiotics Daily INR/CBC Monitor s/sx of bleeding  Thank you for allowing pharmacy to be a part of this patients care team.  Rowe Robert Pharm.D., BCPS, AQ-Cardiology Clinical Pharmacist 06/29/2014 10:38 AM Pager: [phone] Phone: [phone]

## 2014-06-30 DIAGNOSIS — I4891 Unspecified atrial fibrillation: Secondary | ICD-10-CM

## 2014-06-30 LAB — PROTIME-INR
INR: 2.58 — AB (ref 0.00–1.49)
PROTHROMBIN TIME: 27.3 s — AB (ref 11.6–15.2)

## 2014-06-30 LAB — BASIC METABOLIC PANEL
Anion gap: 9 (ref 5–15)
BUN: 23 mg/dL — ABNORMAL HIGH (ref 6–20)
CO2: 27 mmol/L (ref 22–32)
Calcium: 9 mg/dL (ref 8.9–10.3)
Chloride: 104 mmol/L (ref 101–111)
Creatinine, Ser: 1.19 mg/dL (ref 0.61–1.24)
GFR, EST NON AFRICAN AMERICAN: 53 mL/min — AB (ref >60)
Glucose, Bld: 93 mg/dL (ref 65–99)
Potassium: 2.9 mmol/L — ABNORMAL LOW (ref 3.5–5.1)
Sodium: 140 mmol/L (ref 135–145)

## 2014-06-30 LAB — CBC
HEMATOCRIT: 32.3 % — AB (ref 39.0–52.0)
HEMOGLOBIN: 10.4 g/dL — AB (ref 13.0–17.0)
MCH: 30 pg (ref 26.0–34.0)
MCHC: 32.2 g/dL (ref 30.0–36.0)
MCV: 93.1 fL (ref 78.0–100.0)
PLATELETS: 151 10*3/uL (ref 150–400)
RBC: 3.47 MIL/uL — ABNORMAL LOW (ref 4.22–5.81)
RDW: 15.1 % (ref 11.5–15.5)
WBC: 6.1 10*3/uL (ref 4.0–10.5)

## 2014-06-30 MED ORDER — CEFPODOXIME PROXETIL 200 MG PO TABS: 200.0000 mg | ORAL_TABLET | Freq: Two times a day (BID) | ORAL | Status: DC

## 2014-06-30 MED ORDER — POTASSIUM CHLORIDE CRYS ER 20 MEQ PO TBCR
40.0000 meq | EXTENDED_RELEASE_TABLET | ORAL | Status: DC
  Administered 2014-06-30: 40 meq via ORAL
  Filled 2014-06-30: qty 2

## 2014-06-30 MED ORDER — METOPROLOL TARTRATE 25 MG PO TABS: 12.5000 mg | ORAL_TABLET | Freq: Two times a day (BID) | ORAL | Status: AC

## 2014-06-30 MED ORDER — CEFPODOXIME PROXETIL 200 MG PO TABS
200.0000 mg | ORAL_TABLET | Freq: Two times a day (BID) | ORAL | Status: DC
  Administered 2014-06-30: 200 mg via ORAL
  Filled 2014-06-30 (×2): qty 1

## 2014-06-30 NOTE — Discharge Summary (Signed)
[facility]  Physician Discharge Summary   Patient ID: Joshua Rodgers MRN: [id number] DOB/AGE: August 28, 1923 81 y.o.  Admit date: 06/28/2014 Discharge date: 06/30/2014  PCP: Kandice Hams, MD  DISCHARGE DIAGNOSES:  Active Problems:   Lumbar stenosis   Dementia   Bladder, atonic   History of DVT (deep vein thrombosis)   PAF (paroxysmal atrial fibrillation)   Atrial fibrillation with RVR   Elevated troponin   Chronic indwelling Foley catheter   Chronic atrial fibrillation   Urinary tract infectious disease   RECOMMENDATIONS FOR OUTPATIENT FOLLOW UP: 1. PT/INR to be checked by home health   DISCHARGE CONDITION: fair  Diet recommendation: As before  [facility] Weights   06/28/14 0800 06/29/14 0235  Weight: 71.85 kg (158 lb 6.4 oz) 71.8 kg (158 lb 4.6 oz)    INITIAL HISTORY: 81 year old African-American male with a past medical history of dementia, paroxysmal atrial fibrillation, history of DVT, on chronic Coumadin therapy, history of prostate cancer with chronic Foley catheter, presented with shortness of breath. He was found to be tachycardic and was noted to have atrial fibrillation with RVR. Patient was admitted to the hospital for further management.  Consultants: Cardiology  Procedures:  2-D echocardiogram Study Conclusions - Left ventricle: The cavity size was normal. Wall thickness wasincreased in a pattern of severe LVH. Systolic function wasnormal. The estimated ejection fraction was in the range of 55%to 60%. Doppler parameters are consistent with abnormal leftventricular relaxation (grade 1 diastolic dysfunction). Dopplerparameters are consistent with elevated ventricular end-diastolicfilling pressure. - Aortic valve: There was mild stenosis. Valve area (VTI): 1.38cm^2. Valve area (Vmax): 1.29 cm^2. Valve area (Vmean): 1.3 cm^2. - Mitral valve: Calcified annulus. Mildly thickened leaflets . - Left atrium: The atrium was mildly dilated.   HOSPITAL  COURSE:   Atrial fibrillation with RVR Patient was admitted to the hospital and was placed on diltiazem infusion. He initially converted to sinus rhythm but then he converted back to A. fib overnight with controlled heart rate. He did not take his beta blocker last night due to his dementia. He had some sundowning. He is back to baseline this morning and was able to take his medications. Patient has been seen by cardiology. He is not a candidate for any aggressive interventions. Continue warfarin. PT/INR to be checked by home health. No other changes to his medications.   Suspected UTI in the setting of chronic indwelling Foley catheter Urine culture reports did not growing any specific organism. He'll be discharged on oral Vantin. He has a chronic Foley, which was changed at the time of admission. He is followed by Dr. Janice Norrie.  Elevated troponin Likely due to demand ischemia from rapid heart rate. Seen by cardiology. Patient underwent echocardiogram as mentioned above. Considering his other comorbidities, especially his dementia and poor baseline functioning, Patient is not a candidate for further investigations.  History of essential hypertension May resume home medication regimen. Since he is on diuretics have explained to the wife that he needs to keep himself well hydrated. Potassium was repleted prior to discharge. He is on potassium at home.  History of dementia Stable. Pleasantly confused. Continue Namenda.  History of atonic bladder Continue Foley  History of DVT Continue warfarin  History of severe spinal stenosis with bilateral foot drop He is bedbound at baseline.  Overall stable. I think he will benefit from returning to familiar surroundings. Discharge home today with family.   PERTINENT LABS:  The results of significant diagnostics from this hospitalization (including imaging, microbiology, ancillary and  laboratory) are listed below for reference.     Microbiology: Recent Results (from the past 240 hour(s))  Urine culture     Status: None   Collection Time: 06/28/14  4:45 AM  Result Value Ref Range Status   Specimen Description URINE, RANDOM  Final   Special Requests NONE  Final   Culture   Final    MULTIPLE SPECIES PRESENT, SUGGEST RECOLLECTION IF CLINICALLY INDICATED   Report Status 06/29/2014 FINAL  Final  MRSA PCR Screening     Status: Abnormal   Collection Time: 06/28/14  9:01 AM  Result Value Ref Range Status   MRSA by PCR POSITIVE (A) NEGATIVE Final    Comment:        The GeneXpert MRSA Assay (FDA approved for NASAL specimens only), is one component of a comprehensive MRSA colonization surveillance program. It is not intended to diagnose MRSA infection nor to guide or monitor treatment for MRSA infections. RESULT CALLED TO, READ BACK BY AND VERIFIED WITH: BCampbell Riches RN 11:00 06/28/14 ([email or web address])      Labs: Basic Metabolic Panel:  Recent Labs Lab 06/28/14 0435 06/29/14 0258 06/30/14 0346  NA 143 141 140  K 4.7 4.7 2.9*  CL 107 108 104  CO2 20* 23 27  GLUCOSE 146* 125* 93  BUN 22* 28* 23*  CREATININE 1.39* 1.38* 1.19  CALCIUM 9.6 9.1 9.0   CBC:  Recent Labs Lab 06/28/14 0435 06/29/14 0258 06/30/14 0346  WBC 9.4 7.8 6.1  NEUTROABS 6.7  --   --   HGB 12.6* 10.5* 10.4*  HCT 39.9 33.1* 32.3*  MCV 95.7 94.6 93.1  PLT 203 144* 151   Cardiac Enzymes:  Recent Labs Lab 06/28/14 0957 06/28/14 1618 06/28/14 2227  TROPONINI 0.75* 0.84* 0.53*   BNP: BNP (last 3 results)  Recent Labs  06/28/14 0435  BNP 100.6*   IMAGING STUDIES Dg Chest Port 1 View  06/28/2014   CLINICAL DATA:  Shortness of breath.  EXAM: PORTABLE CHEST - 1 VIEW  COMPARISON:  None.  FINDINGS: Shallow inspiration with elevation of the left hemidiaphragm. Heart size and pulmonary vascularity are normal for technique. Mild atelectasis in the lung bases. Circumscribed nodular opacity in the left apex measuring 9 mm. This could  represent a rib sclerosis or a pulmonary nodule. Suggest follow-up with CT for further characterization if clinically indicated. Calcified and tortuous aorta. No pneumothorax.  IMPRESSION: Shallow inspiration with probable atelectasis in the lung bases. Indeterminate nodular opacity in the left apex. Consider chest CT further evaluation peer   Electronically Signed   By: Lucienne Capers M.D.   On: 06/28/2014 05:28    DISCHARGE EXAMINATION: Filed Vitals:   06/29/14 1527 06/29/14 2122 06/30/14 0019 06/30/14 0615  BP: 156/81 158/103 152/81 164/91  Pulse: 42 68 86 82  Temp:  97.6 F (36.4 C)  98.3 F (36.8 C)  TempSrc:  Axillary  Oral  Resp: 20 20  20   Height:      Weight:      SpO2: 95% 98%  99%   General appearance: alert, distracted, appears stated age and no distress Cardio: Irregularly irregular. Systolic murmur throughout the precordium. GI: soft, non-tender; bowel sounds normal; no masses,  no organomegaly Neurologic: Alert but confused. At baseline, per wife.   DISPOSITION: Home with wife  Discharge Instructions    Call MD for:  difficulty breathing, headache or visual disturbances    Complete by:  As directed      Call MD for:  extreme  fatigue    Complete by:  As directed      Call MD for:  persistant dizziness or light-headedness    Complete by:  As directed      Call MD for:  persistant nausea and vomiting    Complete by:  As directed      Call MD for:  severe uncontrolled pain    Complete by:  As directed      Call MD for:  temperature >100.4    Complete by:  As directed      Discharge instructions    Complete by:  As directed   You were cared for by a hospitalist during your hospital stay. If you have any questions about your discharge medications or the care you received while you were in the hospital after you are discharged, you can call the unit and asked to speak with the hospitalist on call if the hospitalist that took care of you is not available. Once you are  discharged, your primary care physician will handle any further medical issues. Please note that NO REFILLS for any discharge medications will be authorized once you are discharged, as it is imperative that you return to your primary care physician (or establish a relationship with a primary care physician if you do not have one) for your aftercare needs so that they can reassess your need for medications and monitor your lab values. If you do not have a primary care physician, you can call [phone] for a physician referral.     Increase activity slowly    Complete by:  As directed            ALLERGIES:  Allergies  Allergen Reactions  . Contrast Media [Iodinated Diagnostic Agents]     Pt blacked out  . Iohexol      Desc: sob, throat swelling (1988 gdc) pt requires full premeds and does well, JB 8/14/7      Discharge Medication List as of 06/30/2014  1:24 PM    CONTINUE these medications which have CHANGED   Details  cefpodoxime (VANTIN) 200 MG tablet Take 1 tablet (200 mg total) by mouth every 12 (twelve) hours. For 6 more days starting tonight., Starting 06/30/2014, Until Discontinued, Print    metoprolol tartrate (LOPRESSOR) 25 MG tablet Take 0.5 tablets (12.5 mg total) by mouth 2 (two) times daily., Starting 06/30/2014, Until Sun 06/30/15, Normal      CONTINUE these medications which have NOT CHANGED   Details  acetaminophen (TYLENOL) 500 MG tablet Take 1,000 mg by mouth every 6 (six) hours as needed for moderate pain, fever or headache., Until Discontinued, Historical Med    ALPRAZolam (XANAX) 0.5 MG tablet Take 0.5 mg by mouth 3 (three) times daily as needed for anxiety. For anxiety., Until Discontinued, Historical Med    aMILoride (MIDAMOR) 5 MG tablet Take 10 mg by mouth 2 (two) times daily. , Until Discontinued, Historical Med    Calcium-Vitamin D (CALTRATE 600 PLUS-VIT D PO) Take 1 tablet by mouth daily. , Until Discontinued, Historical Med    cholecalciferol (VITAMIN D)  1000 UNITS tablet Take 1,000 Units by mouth daily., Until Discontinued, Historical Med    citalopram (CELEXA) 20 MG tablet Take 20 mg by mouth daily., Starting 11/11/2012, Until Discontinued, Historical Med    Cyanocobalamin (VITAMIN B 12 PO) Take 1,000 mg by mouth daily., Until Discontinued, Historical Med    docusate sodium 100 MG CAPS Take 100 mg by mouth 2 (two) times daily., Starting 08/28/2013,  Until Discontinued, No Print    ferrous sulfate 324 (65 FE) MG TBEC Take 1 tablet by mouth daily. , Until Discontinued, Historical Med    Glucosamine-Chondroit-Vit C-Mn (GLUCOSAMINE CHONDR 1500 COMPLX) CAPS Take 1 capsule by mouth daily., Until Discontinued, Historical Med    memantine (NAMENDA XR) 28 MG CP24 24 hr capsule Take 28 mg by mouth daily., Until Discontinued, Historical Med    pantoprazole (PROTONIX) 40 MG tablet Take 40 mg by mouth daily., Until Discontinued, Historical Med    potassium chloride SA (K-DUR,KLOR-CON) 20 MEQ tablet Take 20 mEq by mouth 2 (two) times daily., Until Discontinued, Historical Med    Tamsulosin HCl (FLOMAX) 0.4 MG CAPS Take 0.8 mg by mouth at bedtime., Until Discontinued, Historical Med    warfarin (COUMADIN) 2 MG tablet Take 2-3 mg by mouth every morning. 2 mg every day except on Saturday and Sunday patient to take 3 mg, Until Discontinued, Historical Med       Follow-up Information    Follow up with Kandice Hams, MD. Schedule an appointment as soon as possible for a visit in 1 week.   Specialty:  Internal Medicine   Why:  post hospitalization follow up   Contact information:   [address] [place] [address] [phone]       Follow up with [facility].   Why:  home health nurse to draw blood for BMET and PT/INR 6/21 and then PT/INR 2times per week until pt can go back to clinic to get it checked   Contact information:   [place] [address] [phone]       TOTAL DISCHARGE  TIME: 35 minutes  [facility] Pager [phone]  06/30/2014, 2:47 PM

## 2014-06-30 NOTE — Discharge Instructions (Signed)

## 2014-06-30 NOTE — Care Management Note (Signed)
Case Management Note  Patient Details  Name: Joshua Rodgers MRN: [id number] Date of Birth: May 10, 1923  Subjective/Objective:                   Chief Complaint: shortness of breath Action/Plan: Discharge planning  Expected Discharge Date:  07/01/14               Expected Discharge Plan:  [place]  In-House Referral:     Discharge planning Services  CM Consult  Post Acute Care Choice:    Choice offered to:  Spouse  DME Arranged:    DME Agency:     HH Arranged:  RN [place] Agency:  [facility]  Status of Service:  Completed, signed off  Medicare Important Message Given:    Date Medicare IM Given:    Medicare IM give by:    Date Additional Medicare IM Given:    Additional Medicare Important Message give by:     If discussed at [place] of Stay Meetings, dates discussed:    Additional Comments: CM spoke with wife who states she has used [facility] in the past and would like them to handle the BMET  PT/INR at home until the pt is no longer house bound and can get back to the clinic for this check.  Referral called to [facility] rep, Tiffany.  No other CM needs were communicated. Dellie Catholic, RN 06/30/2014, 12:30 PM

## 2014-06-30 NOTE — Progress Notes (Signed)
Noted orders to DC patient home.Case management made aware of Home Health Needs for Labs on 6/21.DC education given to patient spouse and verbalized understanding.

## 2014-10-22 ENCOUNTER — Emergency Department (HOSPITAL_COMMUNITY): Payer: Medicare PPO

## 2014-10-22 ENCOUNTER — Inpatient Hospital Stay (HOSPITAL_COMMUNITY)
Admission: EM | Admit: 2014-10-22 | Discharge: 2014-10-25 | DRG: 698 | Disposition: A | Discharge status: Home Health | Payer: Medicare PPO | Attending: Internal Medicine | Admitting: Internal Medicine

## 2014-10-22 ENCOUNTER — Encounter (HOSPITAL_COMMUNITY): Payer: Self-pay | Admitting: *Deleted

## 2014-10-22 DIAGNOSIS — R4182 Altered mental status, unspecified: Secondary | ICD-10-CM

## 2014-10-22 DIAGNOSIS — I482 Chronic atrial fibrillation, unspecified: Secondary | ICD-10-CM | POA: Diagnosis present

## 2014-10-22 DIAGNOSIS — I130 Hypertensive heart and chronic kidney disease with heart failure and stage 1 through stage 4 chronic kidney disease, or unspecified chronic kidney disease: Secondary | ICD-10-CM | POA: Diagnosis present

## 2014-10-22 DIAGNOSIS — Z7901 Long term (current) use of anticoagulants: Secondary | ICD-10-CM | POA: Diagnosis not present

## 2014-10-22 DIAGNOSIS — R197 Diarrhea, unspecified: Secondary | ICD-10-CM | POA: Diagnosis present

## 2014-10-22 DIAGNOSIS — Z833 Family history of diabetes mellitus: Secondary | ICD-10-CM | POA: Diagnosis not present

## 2014-10-22 DIAGNOSIS — T83511A Infection and inflammatory reaction due to indwelling urethral catheter, initial encounter: Principal | ICD-10-CM | POA: Diagnosis present

## 2014-10-22 DIAGNOSIS — Z87440 Personal history of urinary (tract) infections: Secondary | ICD-10-CM | POA: Diagnosis not present

## 2014-10-22 DIAGNOSIS — A419 Sepsis, unspecified organism: Secondary | ICD-10-CM | POA: Diagnosis present

## 2014-10-22 DIAGNOSIS — I5032 Chronic diastolic (congestive) heart failure: Secondary | ICD-10-CM | POA: Diagnosis present

## 2014-10-22 DIAGNOSIS — R109 Unspecified abdominal pain: Secondary | ICD-10-CM | POA: Diagnosis not present

## 2014-10-22 DIAGNOSIS — Z993 Dependence on wheelchair: Secondary | ICD-10-CM | POA: Diagnosis not present

## 2014-10-22 DIAGNOSIS — N390 Urinary tract infection, site not specified: Secondary | ICD-10-CM | POA: Diagnosis present

## 2014-10-22 DIAGNOSIS — I77810 Thoracic aortic ectasia: Secondary | ICD-10-CM | POA: Diagnosis present

## 2014-10-22 DIAGNOSIS — M1990 Unspecified osteoarthritis, unspecified site: Secondary | ICD-10-CM | POA: Diagnosis present

## 2014-10-22 DIAGNOSIS — M21379 Foot drop, unspecified foot: Secondary | ICD-10-CM | POA: Diagnosis present

## 2014-10-22 DIAGNOSIS — I10 Essential (primary) hypertension: Secondary | ICD-10-CM | POA: Diagnosis present

## 2014-10-22 DIAGNOSIS — Z978 Presence of other specified devices: Secondary | ICD-10-CM

## 2014-10-22 DIAGNOSIS — R1310 Dysphagia, unspecified: Secondary | ICD-10-CM | POA: Diagnosis present

## 2014-10-22 DIAGNOSIS — Z86718 Personal history of other venous thrombosis and embolism: Secondary | ICD-10-CM

## 2014-10-22 DIAGNOSIS — Y731 Therapeutic (nonsurgical) and rehabilitative gastroenterology and urology devices associated with adverse incidents: Secondary | ICD-10-CM | POA: Diagnosis present

## 2014-10-22 DIAGNOSIS — N189 Chronic kidney disease, unspecified: Secondary | ICD-10-CM | POA: Insufficient documentation

## 2014-10-22 DIAGNOSIS — Z8249 Family history of ischemic heart disease and other diseases of the circulatory system: Secondary | ICD-10-CM | POA: Diagnosis not present

## 2014-10-22 DIAGNOSIS — Z87891 Personal history of nicotine dependence: Secondary | ICD-10-CM

## 2014-10-22 DIAGNOSIS — E876 Hypokalemia: Secondary | ICD-10-CM | POA: Diagnosis present

## 2014-10-22 DIAGNOSIS — G9340 Encephalopathy, unspecified: Secondary | ICD-10-CM | POA: Diagnosis not present

## 2014-10-22 DIAGNOSIS — D696 Thrombocytopenia, unspecified: Secondary | ICD-10-CM | POA: Diagnosis present

## 2014-10-22 DIAGNOSIS — Z9289 Personal history of other medical treatment: Secondary | ICD-10-CM

## 2014-10-22 DIAGNOSIS — E785 Hyperlipidemia, unspecified: Secondary | ICD-10-CM | POA: Diagnosis present

## 2014-10-22 DIAGNOSIS — K219 Gastro-esophageal reflux disease without esophagitis: Secondary | ICD-10-CM | POA: Diagnosis present

## 2014-10-22 DIAGNOSIS — K6289 Other specified diseases of anus and rectum: Secondary | ICD-10-CM | POA: Diagnosis present

## 2014-10-22 DIAGNOSIS — G9341 Metabolic encephalopathy: Secondary | ICD-10-CM | POA: Diagnosis present

## 2014-10-22 DIAGNOSIS — F0390 Unspecified dementia without behavioral disturbance: Secondary | ICD-10-CM | POA: Diagnosis present

## 2014-10-22 DIAGNOSIS — D649 Anemia, unspecified: Secondary | ICD-10-CM | POA: Diagnosis present

## 2014-10-22 DIAGNOSIS — R319 Hematuria, unspecified: Secondary | ICD-10-CM

## 2014-10-22 DIAGNOSIS — Z96652 Presence of left artificial knee joint: Secondary | ICD-10-CM | POA: Diagnosis present

## 2014-10-22 DIAGNOSIS — N289 Disorder of kidney and ureter, unspecified: Secondary | ICD-10-CM | POA: Diagnosis not present

## 2014-10-22 DIAGNOSIS — Z960 Presence of urogenital implants: Secondary | ICD-10-CM

## 2014-10-22 DIAGNOSIS — R7989 Other specified abnormal findings of blood chemistry: Secondary | ICD-10-CM

## 2014-10-22 DIAGNOSIS — N179 Acute kidney failure, unspecified: Secondary | ICD-10-CM | POA: Diagnosis present

## 2014-10-22 DIAGNOSIS — M4806 Spinal stenosis, lumbar region: Secondary | ICD-10-CM | POA: Diagnosis present

## 2014-10-22 DIAGNOSIS — Z8546 Personal history of malignant neoplasm of prostate: Secondary | ICD-10-CM

## 2014-10-22 DIAGNOSIS — M48061 Spinal stenosis, lumbar region without neurogenic claudication: Secondary | ICD-10-CM | POA: Diagnosis present

## 2014-10-22 LAB — CBC WITH DIFFERENTIAL/PLATELET
Basophils Absolute: 0 10*3/uL (ref 0.0–0.1)
Basophils Relative: 0 %
Eosinophils Absolute: 0 10*3/uL (ref 0.0–0.7)
Eosinophils Relative: 0 %
HEMATOCRIT: 38.1 % — AB (ref 39.0–52.0)
Hemoglobin: 12.1 g/dL — ABNORMAL LOW (ref 13.0–17.0)
Lymphocytes Relative: 3 %
Lymphs Abs: 0.7 10*3/uL (ref 0.7–4.0)
MCH: 30.6 pg (ref 26.0–34.0)
MCHC: 31.8 g/dL (ref 30.0–36.0)
MCV: 96.5 fL (ref 78.0–100.0)
MONO ABS: 1.5 10*3/uL — AB (ref 0.1–1.0)
Monocytes Relative: 7 %
Neutro Abs: 19.5 10*3/uL — ABNORMAL HIGH (ref 1.7–7.7)
Neutrophils Relative %: 90 %
PLATELETS: 128 10*3/uL — AB (ref 150–400)
RBC: 3.95 MIL/uL — ABNORMAL LOW (ref 4.22–5.81)
RDW: 15 % (ref 11.5–15.5)
WBC: 21.7 10*3/uL — AB (ref 4.0–10.5)

## 2014-10-22 LAB — COMPREHENSIVE METABOLIC PANEL
ALT: 14 U/L — ABNORMAL LOW (ref 17–63)
ANION GAP: 11 (ref 5–15)
AST: 30 U/L (ref 15–41)
Albumin: 3.1 g/dL — ABNORMAL LOW (ref 3.5–5.0)
Alkaline Phosphatase: 43 U/L (ref 38–126)
BUN: 47 mg/dL — ABNORMAL HIGH (ref 6–20)
CO2: 26 mmol/L (ref 22–32)
CREATININE: 2.71 mg/dL — AB (ref 0.61–1.24)
Calcium: 10.3 mg/dL (ref 8.9–10.3)
Chloride: 108 mmol/L (ref 101–111)
GFR calc Af Amer: 22 mL/min — ABNORMAL LOW (ref >60)
GFR, EST NON AFRICAN AMERICAN: 19 mL/min — AB (ref >60)
Glucose, Bld: 110 mg/dL — ABNORMAL HIGH (ref 65–99)
POTASSIUM: 5.1 mmol/L (ref 3.5–5.1)
Sodium: 145 mmol/L (ref 135–145)
Total Bilirubin: 0.3 mg/dL (ref 0.3–1.2)
Total Protein: 6 g/dL — ABNORMAL LOW (ref 6.5–8.1)

## 2014-10-22 LAB — PROCALCITONIN: Procalcitonin: >175 ng/mL

## 2014-10-22 LAB — BRAIN NATRIURETIC PEPTIDE: B Natriuretic Peptide: 541.9 pg/mL — ABNORMAL HIGH (ref 0.0–100.0)

## 2014-10-22 LAB — CREATININE, URINE, RANDOM: Creatinine, Urine: 74.16 mg/dL

## 2014-10-22 LAB — LIPASE, BLOOD: Lipase: 16 U/L — ABNORMAL LOW (ref 22–51)

## 2014-10-22 LAB — URINE MICROSCOPIC-ADD ON

## 2014-10-22 LAB — I-STAT CG4 LACTIC ACID, ED
LACTIC ACID, VENOUS: 2.31 mmol/L — AB (ref 0.5–2.0)
LACTIC ACID, VENOUS: 1.63 mmol/L (ref 0.5–2.0)

## 2014-10-22 LAB — URINALYSIS, ROUTINE W REFLEX MICROSCOPIC
BILIRUBIN URINE: NEGATIVE
Glucose, UA: NEGATIVE mg/dL
KETONES UR: 15 mg/dL — AB
NITRITE: NEGATIVE
Protein, ur: 100 mg/dL — AB
SPECIFIC GRAVITY, URINE: 1.02 (ref 1.005–1.030)
UROBILINOGEN UA: 0.2 mg/dL (ref 0.0–1.0)
pH: 7.5 (ref 5.0–8.0)

## 2014-10-22 LAB — PROTIME-INR
INR: 3.55 — AB (ref 0.00–1.49)
PROTHROMBIN TIME: 34.8 s — AB (ref 11.6–15.2)

## 2014-10-22 LAB — LACTIC ACID, PLASMA
LACTIC ACID, VENOUS: 1.8 mmol/L (ref 0.5–2.0)
Lactic Acid, Venous: 1.5 mmol/L (ref 0.5–2.0)

## 2014-10-22 LAB — GLUCOSE, CAPILLARY: GLUCOSE-CAPILLARY: 88 mg/dL (ref 65–99)

## 2014-10-22 LAB — APTT: aPTT: 46 seconds — ABNORMAL HIGH (ref 24–37)

## 2014-10-22 MED ORDER — METOPROLOL TARTRATE 12.5 MG HALF TABLET
12.5000 mg | ORAL_TABLET | Freq: Two times a day (BID) | ORAL | Status: DC
  Administered 2014-10-22: 12.5 mg via ORAL
  Filled 2014-10-22: qty 1

## 2014-10-22 MED ORDER — FERROUS SULFATE 325 (65 FE) MG PO TABS
325.0000 mg | ORAL_TABLET | Freq: Two times a day (BID) | ORAL | Status: DC
  Administered 2014-10-22 – 2014-10-25 (×7): 325 mg via ORAL
  Filled 2014-10-22 (×7): qty 1

## 2014-10-22 MED ORDER — WARFARIN - PHARMACIST DOSING INPATIENT: Freq: Every day | Status: DC

## 2014-10-22 MED ORDER — SODIUM CHLORIDE 0.9 % IV SOLN
1000.0000 mL | Freq: Once | INTRAVENOUS | Status: AC
  Administered 2014-10-22: 1000 mL via INTRAVENOUS

## 2014-10-22 MED ORDER — METOPROLOL TARTRATE 12.5 MG HALF TABLET
12.5000 mg | ORAL_TABLET | Freq: Two times a day (BID) | ORAL | Status: DC
  Administered 2014-10-22 – 2014-10-25 (×6): 12.5 mg via ORAL
  Filled 2014-10-22 (×6): qty 1

## 2014-10-22 MED ORDER — DEXTROSE 5 % IV SOLN: 1.0000 g | INTRAVENOUS | Status: DC

## 2014-10-22 MED ORDER — PANTOPRAZOLE SODIUM 40 MG PO TBEC: 40.0000 mg | DELAYED_RELEASE_TABLET | Freq: Every day | ORAL | Status: DC

## 2014-10-22 MED ORDER — SODIUM CHLORIDE 0.9 % IJ SOLN
3.0000 mL | Freq: Two times a day (BID) | INTRAMUSCULAR | Status: DC
  Administered 2014-10-24: 3 mL via INTRAVENOUS

## 2014-10-22 MED ORDER — MORPHINE SULFATE (PF) 2 MG/ML IV SOLN: 1.0000 mg | INTRAVENOUS | Status: DC | PRN

## 2014-10-22 MED ORDER — HYDRALAZINE HCL 20 MG/ML IJ SOLN
5.0000 mg | INTRAMUSCULAR | Status: DC | PRN
  Administered 2014-10-24: 5 mg via INTRAVENOUS
  Filled 2014-10-22: qty 1

## 2014-10-22 MED ORDER — BARIUM SULFATE 2.1 % PO SUSP
ORAL | Status: AC
  Administered 2014-10-22: 1 mL
  Filled 2014-10-22: qty 2

## 2014-10-22 MED ORDER — SODIUM CHLORIDE 0.9 % IV SOLN
1000.0000 mL | INTRAVENOUS | Status: DC
  Administered 2014-10-22 (×2): 1000 mL via INTRAVENOUS

## 2014-10-22 MED ORDER — ALPRAZOLAM 0.5 MG PO TABS
0.5000 mg | ORAL_TABLET | Freq: Three times a day (TID) | ORAL | Status: DC | PRN
  Administered 2014-10-22: 0.5 mg via ORAL
  Filled 2014-10-22: qty 1

## 2014-10-22 MED ORDER — ACETAMINOPHEN 325 MG PO TABS: 650.0000 mg | ORAL_TABLET | Freq: Four times a day (QID) | ORAL | Status: DC | PRN

## 2014-10-22 MED ORDER — FAMOTIDINE IN NACL 20-0.9 MG/50ML-% IV SOLN
20.0000 mg | Freq: Two times a day (BID) | INTRAVENOUS | Status: DC
  Administered 2014-10-22 – 2014-10-25 (×7): 20 mg via INTRAVENOUS
  Filled 2014-10-22 (×9): qty 50

## 2014-10-22 MED ORDER — ONDANSETRON HCL 4 MG PO TABS: 4.0000 mg | ORAL_TABLET | Freq: Four times a day (QID) | ORAL | Status: DC | PRN

## 2014-10-22 MED ORDER — SODIUM CHLORIDE 0.9 % IV BOLUS (SEPSIS): 1000.0000 mL | Freq: Once | INTRAVENOUS | Status: DC

## 2014-10-22 MED ORDER — CALCIUM CITRATE 950 (200 CA) MG PO TABS
2.0000 | ORAL_TABLET | Freq: Two times a day (BID) | ORAL | Status: DC
  Administered 2014-10-22 – 2014-10-25 (×6): 400 mg via ORAL
  Filled 2014-10-22 (×12): qty 2

## 2014-10-22 MED ORDER — CITALOPRAM HYDROBROMIDE 20 MG PO TABS
10.0000 mg | ORAL_TABLET | Freq: Every day | ORAL | Status: DC
  Administered 2014-10-22 – 2014-10-25 (×4): 10 mg via ORAL
  Filled 2014-10-22 (×4): qty 1

## 2014-10-22 MED ORDER — SODIUM CHLORIDE 0.9 % IV SOLN
1000.0000 mL | INTRAVENOUS | Status: DC
  Administered 2014-10-22: 1000 mL via INTRAVENOUS

## 2014-10-22 MED ORDER — ACETAMINOPHEN 650 MG RE SUPP: 650.0000 mg | Freq: Four times a day (QID) | RECTAL | Status: DC | PRN

## 2014-10-22 MED ORDER — GLUCOSAMINE CHONDR 1500 COMPLX PO CAPS: 1.0000 | ORAL_CAPSULE | Freq: Every day | ORAL | Status: DC

## 2014-10-22 MED ORDER — CIPROFLOXACIN IN D5W 400 MG/200ML IV SOLN
400.0000 mg | INTRAVENOUS | Status: DC
Start: 2014-10-22 — End: 2014-10-25
  Administered 2014-10-23 – 2014-10-25 (×3): 400 mg via INTRAVENOUS
  Filled 2014-10-22 (×3): qty 200

## 2014-10-22 MED ORDER — DEXTROSE 5 % IV SOLN
1.0000 g | Freq: Once | INTRAVENOUS | Status: AC
  Administered 2014-10-22: 1 g via INTRAVENOUS
  Filled 2014-10-22: qty 10

## 2014-10-22 MED ORDER — ONDANSETRON HCL 4 MG/2ML IJ SOLN: 4.0000 mg | Freq: Four times a day (QID) | INTRAMUSCULAR | Status: DC | PRN

## 2014-10-22 MED ORDER — VITAMIN D 1000 UNITS PO TABS
1000.0000 [IU] | ORAL_TABLET | Freq: Two times a day (BID) | ORAL | Status: DC
  Administered 2014-10-22 – 2014-10-25 (×7): 1000 [IU] via ORAL
  Filled 2014-10-22 (×7): qty 1

## 2014-10-22 MED ORDER — VITAMIN B-12 1000 MCG PO TABS
1000.0000 ug | ORAL_TABLET | Freq: Every day | ORAL | Status: DC
  Administered 2014-10-22 – 2014-10-25 (×4): 1000 ug via ORAL
  Filled 2014-10-22 (×5): qty 1

## 2014-10-22 MED ORDER — METRONIDAZOLE IN NACL 5-0.79 MG/ML-% IV SOLN
500.0000 mg | Freq: Three times a day (TID) | INTRAVENOUS | Status: DC
  Administered 2014-10-22 – 2014-10-25 (×10): 500 mg via INTRAVENOUS
  Filled 2014-10-22 (×10): qty 100

## 2014-10-22 MED ORDER — MEMANTINE HCL ER 28 MG PO CP24
28.0000 mg | ORAL_CAPSULE | Freq: Every day | ORAL | Status: DC
  Administered 2014-10-22 – 2014-10-25 (×4): 28 mg via ORAL
  Filled 2014-10-22 (×4): qty 1

## 2014-10-22 NOTE — Progress Notes (Signed)
SLP Cancellation Note  Patient Details Name: Joshua Rodgers MRN: [id number] DOB: 1923/12/14   Cancelled treatment:       Reason Eval/Treat Not Completed: Patient refused. SLP and wife attempted to offer various POs, however pt adamantly refused all intake. Pt started physically resisting SLP (hitting, pushing), therefore attempts were stopped. Will f/u on next date.   Germain Osgood, M.A. CCC-SLP [phone]  Germain Osgood 10/22/2014, 4:40 PM

## 2014-10-22 NOTE — H&P (Signed)
[facility] History and Physical  Joshua Rodgers TDV:[id number] DOB: 06-22-1923 DOA: 10/22/2014  Referring physician: ED physician PCP: Kandice Hams, MD  Specialists:   Chief Complaint: Fever, altered mental status, abdominal pain, diarrhea  HPI: Joshua Rodgers is a 81 y.o. male with PMH of hypertension, GERD, depression, anxiety, chronic bilateral leg weakness and chronic of Foley catheter secondary to lumbar stenosis, dementia, prostate cancer, (s/p of radiation therapy), DVT, asthma, GI bleeding 2/2 peptic ulcer, diastolic congestive heart failure (EF 06-26%, grade 1 diastolic dysfunction), atrial fibrillation on Coumadin, who presents with a fever, altered mental status and dominant pain.  Per his wife, had developed fever 2 days ago. He had diarrhea yesterday, now seems to have resolved. He complains of abdominal pain. Patient is more confused, mental status is worse than his baseline and he is less interactive now. His wife states that pt has constant generalized body aches. He dose not have chest pain, shortness of breath, cough. He has chronic bilateral leg weakness, which has not changed.  In ED, patient was found to have positive urinalysis, lipase 16, lactate 2.31 which improved to 1.63 after 1 L of normal saline bolus, WBC 21.7, temperature 99.3, no tachycardia, worsening renal function. CT abdomen/pelvis that showed a possible proctitis.  Where does patient live?   At home    Can patient participate in ADLs?   None   Review of Systems:   Allergy:  Allergies  Allergen Reactions  . Contrast Media [Iodinated Diagnostic Agents]     Pt blacked out  . Iohexol      Desc: sob, throat swelling (1988 gdc) pt requires full premeds and does well, JB 8/14/7     Past Medical History  Diagnosis Date  . Generalized weakness   . Hypokalemia   . Renal disorder   . Hyperlipidemia   . Vitamin D deficiency   . Bell's palsy   . Lumbar stenosis   . Hypertension   . Depression    . Dementia   . Prostate cancer ([place]) ~ 2004    "had radiation tx" (11/24/2012)  . DVT (deep venous thrombosis) ([place]) 11/24/2012    Acute DVT in the rightcommon femoral vein and acute DVT involving the leftcommon femorall,profinda popliteal and posterior tibial vein 2013. Pt seen by hematology in 2014. W/S revealed continued clot in left leg  . Asthma     "as a child, real bad" (11/24/2012)  . History of bleeding ulcers ~ 1956    "in hospital for ~ 1 month" (11/24/2012)  . Recurrent UTI (urinary tract infection) 2013-now    "lots" (11/24/2012)  . DJD (degenerative joint disease)   . Arthritis     "joints" (11/24/2012)  . Anxiety   . Hyperaldosteronism ([place])   . CHF (congestive heart failure) (HCC)     Diastolic dysfunction  . Vitamin B12 deficiency   . Spinal stenosis     Dr Christella Noa  . Dilated aortic root ([place])   . Mild aortic stenosis     echo 2012  . Atrial fibrillation [facility])     Past Surgical History  Procedure Laterality Date  . Total knee arthroplasty Left 02/2010  . Back surgery  2008    "for stenosis" (11/24/2012)  . Esophagogastroduodenoscopy Left 02/16/2013    Procedure: ESOPHAGOGASTRODUODENOSCOPY (EGD);  Surgeon: Arta Silence, MD;  Location: [facility] ENDOSCOPY;  Service: Endoscopy;  Laterality: Left;    Social History:  reports that he has quit smoking. His smoking use included Cigarettes. He quit after 10  years of use. He has never used smokeless tobacco. He reports that he drinks alcohol. He reports that he does not use illicit drugs.  Family History:  Family History  Problem Relation Age of Onset  . Heart disease Mother   . Diabetes Father      Prior to Admission medications   Medication Sig Start Date End Date Taking? Authorizing Provider  acetaminophen (TYLENOL) 500 MG tablet Take 1,000 mg by mouth every 6 (six) hours as needed for moderate pain, fever or headache.   Yes Historical Provider, MD  ALPRAZolam Duanne Moron) 0.5 MG tablet Take 0.5 mg by mouth 3 (three)  times daily as needed for anxiety. For anxiety.   Yes Historical Provider, MD  aMILoride (MIDAMOR) 5 MG tablet Take 10 mg by mouth 2 (two) times daily.    Yes Historical Provider, MD  amLODipine (NORVASC) 10 MG tablet Take 10 mg by mouth daily.   Yes Historical Provider, MD  CALCIUM CITRATE PO Take 400 mg by mouth 2 (two) times daily.   Yes Historical Provider, MD  cholecalciferol (VITAMIN D) 1000 UNITS tablet Take 1,000 Units by mouth 2 (two) times daily.    Yes Historical Provider, MD  ciprofloxacin (CIPRO) 500 MG tablet Take 500 mg by mouth 2 (two) times daily.   Yes Historical Provider, MD  citalopram (CELEXA) 10 MG tablet Take 10 mg by mouth daily.   Yes Historical Provider, MD  Cyanocobalamin (VITAMIN B 12 PO) Take 1,000 mg by mouth daily.   Yes Historical Provider, MD  docusate sodium 100 MG CAPS Take 100 mg by mouth 2 (two) times daily. 08/28/13  Yes Marianne L York, PA-C  ferrous sulfate 324 (65 FE) MG TBEC Take 1 tablet by mouth 2 (two) times daily.    Yes Historical Provider, MD  Glucosamine-Chondroit-Vit C-Mn (GLUCOSAMINE CHONDR 1500 COMPLX) CAPS Take 1 capsule by mouth daily.   Yes Historical Provider, MD  memantine (NAMENDA XR) 28 MG CP24 24 hr capsule Take 28 mg by mouth daily.   Yes Historical Provider, MD  metoprolol tartrate (LOPRESSOR) 25 MG tablet Take 0.5 tablets (12.5 mg total) by mouth 2 (two) times daily. 06/30/14 06/30/15 Yes Bonnielee Haff, MD  pantoprazole (PROTONIX) 40 MG tablet Take 40 mg by mouth daily.   Yes Historical Provider, MD  potassium chloride SA (K-DUR,KLOR-CON) 20 MEQ tablet Take 20 mEq by mouth 2 (two) times daily.   Yes Historical Provider, MD  warfarin (COUMADIN) 2 MG tablet Take 2-3 mg by mouth daily at 6 PM.    Yes Historical Provider, MD  cefpodoxime (VANTIN) 200 MG tablet Take 1 tablet (200 mg total) by mouth every 12 (twelve) hours. For 6 more days starting tonight. Patient not taking: Reported on 10/22/2014 06/30/14   Bonnielee Haff, MD    Physical  Exam: Filed Vitals:   10/22/14 0515 10/22/14 0545 10/22/14 0600 10/22/14 0615  BP: 139/89 118/79 136/78 125/90  Pulse: 88 84 83 81  Temp:      TempSrc:      Resp: 22 20 24 22   Height:      Weight:      SpO2: 95% 99% 100% 98%   General: Not in acute distress HEENT:       Eyes: PERRL, EOMI, no scleral icterus.       ENT: No discharge from the ears and nose, no pharynx injection, no tonsillar enlargement.        Neck: No JVD, no bruit, no mass felt. Heme: No neck lymph node  enlargement. Cardiac: G5/O0, RRR, 3/6 systolic murmurs, No gallops or rubs. Pulm:  No rales, wheezing, rhonchi or rubs. Abd: Soft, nondistended, tender over mid and lower abdomen, no organomegaly, BS present. Ext: No pitting leg edema bilaterally. 2+DP/PT pulse bilaterally. Musculoskeletal: No joint deformities, No joint redness or warmth, no limitation of ROM in spin. Skin: No rashes.  Neuro: confused, not oriented X3, cranial nerves II-XII grossly intact, has bilateral leg weakness, moves all extremities. Psych: Cannot be reviewed due to altered mental status  Labs on Admission:  Basic Metabolic Panel:  Recent Labs Lab 10/22/14 0232  NA 145  K 5.1  CL 108  CO2 26  GLUCOSE 110*  BUN 47*  CREATININE 2.71*  CALCIUM 10.3   Liver Function Tests:  Recent Labs Lab 10/22/14 0232  AST 30  ALT 14*  ALKPHOS 43  BILITOT 0.3  PROT 6.0*  ALBUMIN 3.1*    Recent Labs Lab 10/22/14 0232  LIPASE 16*   No results for input(s): AMMONIA in the last 168 hours. CBC:  Recent Labs Lab 10/22/14 0232  WBC 21.7*  NEUTROABS 19.5*  HGB 12.1*  HCT 38.1*  MCV 96.5  PLT 128*   Cardiac Enzymes: No results for input(s): CKTOTAL, CKMB, CKMBINDEX, TROPONINI in the last 168 hours.  BNP (last 3 results)  Recent Labs  06/28/14 0435  BNP 100.6*    ProBNP (last 3 results) No results for input(s): PROBNP in the last 8760 hours.  CBG: No results for input(s): GLUCAP in the last 168 hours.  Radiological  Exams on Admission: Ct Abdomen Pelvis Wo Contrast  10/22/2014   CLINICAL DATA:  Left lower quadrant tenderness. History of prostate cancer. No IV contrast material due to allergy. Altered mental status.  EXAM: CT ABDOMEN AND PELVIS WITHOUT CONTRAST  TECHNIQUE: Multidetector CT imaging of the abdomen and pelvis was performed following the standard protocol without IV contrast.  COMPARISON:  None.  FINDINGS: Examination technically limited due to motion artifact. Minimal bilateral pleural effusions. Coronary artery calcifications.  Bilateral adrenal gland nodules measuring 13 mm on the right eighth and 3.1 cm on the left. Hounsfield unit measurements are consistent with fat containing adenomas. The unenhanced appearance of the liver, spleen, pancreas, kidneys, inferior vena cava, and retroperitoneal lymph nodes is unremarkable. Calcification of abdominal aorta without aneurysm. Small umbilical hernia containing fat. Stomach is mildly distended without wall thickening. Contrast material flows through to the colon without evidence of small bowel obstruction. No small bowel wall thickening or distention. Stool-filled colon without distention. No free air or free fluid in the abdomen.  Pelvis: Foley catheter in the bladder accounts for gas in the bladder. No bladder wall thickening. Small bladder diverticula. Prostate gland is mildly enlarged at 3.6 x 4.5 cm diameter. Prostate gland is slightly asymmetric towards the left posteriorly. This may correlate to patient's history of prostate cancer. Surgical clips are present in the pelvis. Pelvic and groin lymph nodes are present without pathologic enlargement. No free or loculated pelvic fluid collections. No pelvic mass or lymphadenopathy. Rectosigmoid colon is mostly decompressed. Suggestion of wall thickening in the rectum may indicate proctitis. Small lipoma in the left iliacus muscle. Diffuse bone demineralization. Degenerative changes in the spine. No definite  evidence of any bone metastasis.  IMPRESSION: Bilateral adrenal gland nodules consistent with adenomas. Thickening of the wall of the rectum may indicate proctitis. Diffuse bone demineralization. No definite evidence of bone metastasis. Mild asymmetric enlargement of the prostate gland.   Electronically Signed   By: Gwyndolyn Saxon  Gerilyn Nestle M.D.   On: 10/22/2014 05:13   Dg Chest Port 1 View  10/22/2014   CLINICAL DATA:  81 year old male with altered mental status. Initial encounter.  EXAM: PORTABLE CHEST 1 VIEW  COMPARISON:  06/28/2014.  FINDINGS: Portable AP semi upright view at 0248 hours. Low but mildly improved lung volumes compared to prior. Stable cardiac size and mediastinal contours. Allowing for portable technique, the lungs are clear. No pneumothorax or pleural effusion identified.  IMPRESSION: No acute cardiopulmonary abnormality.   Electronically Signed   By: Genevie Ann M.D.   On: 10/22/2014 02:57    EKG:  Not done in ED, will get one.   Assessment/Plan Principal Problem:   UTI (lower urinary tract infection) Active Problems:   Lumbar stenosis   Dementia   HTN (hypertension)   History of DVT (deep vein thrombosis)   Dilated aortic root (HCC)   Chronic diastolic CHF (congestive heart failure) (HCC)   Abdominal pain   Sepsis (HCC)   Diarrhea   Chronic indwelling Foley catheter   Chronic atrial fibrillation (HCC)   Acute encephalopathy   UTI and sepsis: Urinalysis is positive. Chronic dwelling catheter is the risk factor. He is septic with the elevated lactate, leukocytosis and tachypnea. He is hemodynamically stable.   - Admit to telemetry - start cipro and flagyl which are also for possible proctitis - Follow up results of urine and blood cx and amend antibiotic regimen if needed per sensitivity results - prn Zofran for nausea - will get Procalcitonin and trend lactic acid levels per sepsis protocol. - IVF: 2L of NS bolus in ED, followed by 100 cc/h (patient has a congestive heart  failure, limiting aggressive IV fluids treatment). - change dwelling Foley cath  Diarrhea and abdominal pain: likely due to possible proctitis as showed by CT-abdomen/pelvis -started cipro and flagy -Check C. difficile PCR and the pathogen panel -prn morphine for pain  Acute encephalopathy: Most likely due to sepsis secondary to UTI and possible proctitis -Frequent neuro checks -Treat underlying problems  Dementia: -Memantine  HTN (hypertension): -Hold amlodipine and amiloride due to sepsis, since patient is at risk of developing hypotension. -Continue metoprolol -IV hydralazine when necessary  Atrial Fibrillation: CHA2DS2-VASc Score is 4, needs oral anticoagulation. Patient is on Coumadin.  INR is pending on admission. Heart rate is well controlled. -Continue metoprolol -Coumadin per pharmacy  History of DVT (deep vein thrombosis): - On coumadin  Chronic diastolic CHF (congestive heart failure) ([place]): 2-D echo on 06/28/14 showed EF 55-60 percent with grade 1 diastolic dysfunction. Patient is on amiloride at home. CHF is compensated. Patient does not have any leg edema -Hold amiloride -check BNP -Continue metoprolol  GERD: -will switch PPI to pepcid IV until C diff pcr negative  DVT ppx: on coumadin Code Status: Full code Family Communication:  Yes, patient's wife and daughter  at bed side Disposition Plan: Admit to inpatient   Date of Service 10/22/2014    Ivor Costa [facility] Pager [phone]  If 7PM-7AM, please contact night-coverage [email or web address] Password [email or web address] 10/22/2014, 6:51 AM

## 2014-10-22 NOTE — Progress Notes (Signed)
[facility] PROGRESS NOTE  Joshua Rodgers ZDG:[id number] DOB: 1923-12-20 DOA: 10/22/2014 PCP: Kandice Hams, MD  Subjective: Today the patient is more alert according to family. Reports abdominal pain, but not tender on palpation. No fever, chills, N/V/D.   Assessment/Plan: Principal Problem:  UTI with sepsis: Patient presented in sepsis with elevated lactic acid (2.31), leukocytosis (21.7), and tachypnea. UA positive for UTI. Likely secondary to chronic dwelling catheter and possible proctitis (see abdominal pain below). Patient was started on cipro/flagyl and IVF. Urine culture pending, will amend antibiotics based on results. Blood culture pending. Patient clinically improving- lactate 1.5 and afebrile. Continue antibiotics and monitor/follow clinical course.   Active Problems:  Abdominal pain: Present since 10/20/14 with 1 episode of diarrhea on same day. Likely secondary to proctitis as shown on CT. C. Diff cultures pending. Patient reports continued pain, but is not tender on exam. Continue Cipro/flagyl and monitor.   AKI: BUN/Cr 47/2.71 on admission, likely pre-renal azotemia from UTI/proctitis and sepsis. Continue IVF and monitor.   Acute encephalopathy: Likely secondary to UTI/sepsis. Patient is improving today per family. Continue treating underlying cause and frequent neuro checks.   Dementia: Chronic, managed at home with Memantine. Continue supportive care and monitor.   Essential HTN: Chronic, managed at home with metoprolol, amlodapine, and amiloride. Metoprolol continued, but others held at admission with to sepsis and high risk of hypotension. BP stable today at 124/98. Continue PRN hydralazine and metoprolol, monitor.   Atrial Fibrillation: Chronic, patient is currently on Coumadin and metoprolol. (CHA2DS2-VASc Score 4). INR 3.55 (goal 2-3) and HR well controlled. Continue at home medications and monitor, pharmacy following for INR managment.   Chronic diastolic CHF:   Chronic, managed at home with amiloride. 2-D echo on 06/28/14 showed EF 55-60 percent with grade 1 diastolic dysfunction. Amiloride held on admission d/t high risk of hypotension with sepsis. Metoprolol continued. No evidence of fluid overload. BNP 541. Continue current regimen and continue.   GERD: Managed at home with PPI, switched to pepcid IV until C diff results obtained. No complications. Continue, monitor.   Mild Anemia: Hgb 12.1 on admission. Etiology unknown, no evidence of active GI bleed. Continue to monitor.  Thrombocytopenia: Mild 128 on admission. Etiology unknown, continue to monitor.   Chronic B/L Lower ext weakness with foot drop:from severe spinal stenosis-bed to wheelchair bound at baseline  Code Status: Full Family Communication: Wife and daughter at bedside Disposition Plan: Remain inpatient    Consultants:  None  Procedures:  None   Antibiotics: Anti-infectives    Start     Dose/Rate Route Frequency Ordered Stop   10/22/14 0630  cefTRIAXone (ROCEPHIN) 1 g in dextrose 5 % 50 mL IVPB  Status:  Discontinued     1 g 100 mL/hr over 30 Minutes Intravenous Every 24 hours 10/22/14 0619 10/22/14 0622   10/22/14 0630  metroNIDAZOLE (FLAGYL) IVPB 500 mg     500 mg 100 mL/hr over 60 Minutes Intravenous Every 8 hours 10/22/14 0626     10/22/14 0630  ciprofloxacin (CIPRO) IVPB 400 mg     400 mg 200 mL/hr over 60 Minutes Intravenous Every 24 hours 10/22/14 0628     10/22/14 0500  cefTRIAXone (ROCEPHIN) 1 g in dextrose 5 % 50 mL IVPB     1 g 100 mL/hr over 30 Minutes Intravenous  Once 10/22/14 0445 10/22/14 0538         Objective: Filed Vitals:   10/22/14 0821  BP: 153/81  Pulse: 84  Temp: 98 F (  36.7 C)  Resp: 20    Intake/Output Summary (Last 24 hours) at 10/22/14 1132 Last data filed at 10/22/14 1124  Gross per 24 hour  Intake     60 ml  Output      0 ml  Net     60 ml   Filed Weights   10/22/14 0209 10/22/14 0821  Weight: 71.668 kg (158 lb)  70.6 kg (155 lb 10.3 oz)    Exam:   General:  Frail appearing man, sleeping in bed, no acute distress  Cardiovascular: RRR, no m/r/g appreciated. No peripheral edema  Respiratory: CTA b/l. No wheeze or crackles   Abdomen: Soft, non-distended, non-tender. + BS   Neuro: Aroused with stimulation. No focal neurological deficits noted. Bilateral leg weakness, but moves all extremities appropriately  Data Reviewed: Basic Metabolic Panel:  Recent Labs Lab 10/22/14 0232  NA 145  K 5.1  CL 108  CO2 26  GLUCOSE 110*  BUN 47*  CREATININE 2.71*  CALCIUM 10.3   Liver Function Tests:  Recent Labs Lab 10/22/14 0232  AST 30  ALT 14*  ALKPHOS 43  BILITOT 0.3  PROT 6.0*  ALBUMIN 3.1*    Recent Labs Lab 10/22/14 0232  LIPASE 16*   No results for input(s): AMMONIA in the last 168 hours. CBC:  Recent Labs Lab 10/22/14 0232  WBC 21.7*  NEUTROABS 19.5*  HGB 12.1*  HCT 38.1*  MCV 96.5  PLT 128*   Cardiac Enzymes: No results for input(s): CKTOTAL, CKMB, CKMBINDEX, TROPONINI in the last 168 hours. BNP (last 3 results)  Recent Labs  06/28/14 0435 10/22/14 0657  BNP 100.6* 541.9*    ProBNP (last 3 results) No results for input(s): PROBNP in the last 8760 hours.  CBG:  Recent Labs Lab 10/22/14 0845  GLUCAP 88    Recent Results (from the past 240 hour(s))  Culture, blood (x 2)     Status: None (Preliminary result)   Collection Time: 10/22/14  9:00 AM  Result Value Ref Range Status   Specimen Description BLOOD RIGHT ANTECUBITAL  Final   Special Requests BOTTLES DRAWN AEROBIC AND ANAEROBIC 5CC  Final   Culture PENDING  Incomplete   Report Status PENDING  Incomplete     Studies: Ct Abdomen Pelvis Wo Contrast  10/22/2014   CLINICAL DATA:  Left lower quadrant tenderness. History of prostate cancer. No IV contrast material due to allergy. Altered mental status.  EXAM: CT ABDOMEN AND PELVIS WITHOUT CONTRAST  TECHNIQUE: Multidetector CT imaging of the  abdomen and pelvis was performed following the standard protocol without IV contrast.  COMPARISON:  None.  FINDINGS: Examination technically limited due to motion artifact. Minimal bilateral pleural effusions. Coronary artery calcifications.  Bilateral adrenal gland nodules measuring 13 mm on the right eighth and 3.1 cm on the left. Hounsfield unit measurements are consistent with fat containing adenomas. The unenhanced appearance of the liver, spleen, pancreas, kidneys, inferior vena cava, and retroperitoneal lymph nodes is unremarkable. Calcification of abdominal aorta without aneurysm. Small umbilical hernia containing fat. Stomach is mildly distended without wall thickening. Contrast material flows through to the colon without evidence of small bowel obstruction. No small bowel wall thickening or distention. Stool-filled colon without distention. No free air or free fluid in the abdomen.  Pelvis: Foley catheter in the bladder accounts for gas in the bladder. No bladder wall thickening. Small bladder diverticula. Prostate gland is mildly enlarged at 3.6 x 4.5 cm diameter. Prostate gland is slightly asymmetric towards the left posteriorly.  This may correlate to patient's history of prostate cancer. Surgical clips are present in the pelvis. Pelvic and groin lymph nodes are present without pathologic enlargement. No free or loculated pelvic fluid collections. No pelvic mass or lymphadenopathy. Rectosigmoid colon is mostly decompressed. Suggestion of wall thickening in the rectum may indicate proctitis. Small lipoma in the left iliacus muscle. Diffuse bone demineralization. Degenerative changes in the spine. No definite evidence of any bone metastasis.  IMPRESSION: Bilateral adrenal gland nodules consistent with adenomas. Thickening of the wall of the rectum may indicate proctitis. Diffuse bone demineralization. No definite evidence of bone metastasis. Mild asymmetric enlargement of the prostate gland.    Electronically Signed   By: Lucienne Capers M.D.   On: 10/22/2014 05:13   Dg Chest Port 1 View  10/22/2014   CLINICAL DATA:  81 year old male with altered mental status. Initial encounter.  EXAM: PORTABLE CHEST 1 VIEW  COMPARISON:  06/28/2014.  FINDINGS: Portable AP semi upright view at 0248 hours. Low but mildly improved lung volumes compared to prior. Stable cardiac size and mediastinal contours. Allowing for portable technique, the lungs are clear. No pneumothorax or pleural effusion identified.  IMPRESSION: No acute cardiopulmonary abnormality.   Electronically Signed   By: Genevie Ann M.D.   On: 10/22/2014 02:57    Scheduled Meds: . calcium citrate  2 tablet Oral BID  . cholecalciferol  1,000 Units Oral BID  . ciprofloxacin  400 mg Intravenous Q24H  . citalopram  10 mg Oral Daily  . famotidine (PEPCID) IV  20 mg Intravenous Q12H  . ferrous sulfate  325 mg Oral BID  . memantine  28 mg Oral Daily  . metoprolol tartrate  12.5 mg Oral BID  . metronidazole  500 mg Intravenous Q8H  . sodium chloride  1,000 mL Intravenous Once  . sodium chloride  3 mL Intravenous Q12H  . vitamin B-12  1,000 mcg Oral Daily  . Warfarin - Pharmacist Dosing Inpatient   Does not apply q1800   Continuous Infusions: . sodium chloride 1,000 mL (10/22/14 0849)    Principal Problem:   UTI (lower urinary tract infection) Active Problems:   Lumbar stenosis   Dementia   HTN (hypertension)   History of DVT (deep vein thrombosis)   Dilated aortic root (HCC)   Chronic diastolic CHF (congestive heart failure) (HCC)   Abdominal pain   Sepsis ([place])   Diarrhea   Chronic indwelling Foley catheter   Chronic atrial fibrillation ([place])   Acute encephalopathy   Micayla Zeltman, Student-PA  [facility] If 7PM-7AM, please contact night-coverage at [email or web address], password [facility] 10/22/2014, 11:32 AM  LOS: 0 days     Attending MD note  Patient was seen, examined,treatment plan was discussed with PA-S.  I have  personally reviewed the clinical findings, lab, imaging studies and management of this patient in detail. I agree with the documentation, as recorded by PA-S.   Admitted with sepsis from Complicated UTI-has chronic indwelling Foley. ?Proctitis seen on CT Abd-abd soft and non tender on exam. Continue empiric cipro/flagyl for now-follow cultures. Continue IVF and repeat lytes in am. Suspect we could start diet once more awake and alert.   [facility]

## 2014-10-22 NOTE — ED Provider Notes (Signed)
CSN: [id number]     Arrival date & time 10/22/14  0201 History   By signing my name below, I, Forrestine Him, attest that this documentation has been prepared under the direction and in the presence of Delora Fuel, MD. Electronically Signed: Forrestine Him, ED Scribe. 10/22/2014. 2:21 AM.   Chief Complaint  Patient presents with  . Fever  . Altered Mental Status   The history is provided by the patient. No language interpreter was used.    LEVEL 5 CAVEAT DUE TO ALTERED MENTAL STATUS   HPI Comments: Joshua Rodgers is a 81 y.o. male with  PMHx of hyperlipidemia, HTN, CHF, and A-Fib who presents to the Emergency Department here for a reported fever and diarrhea 2 evenings ago that has now resolved per his wife. Temperature unknown. However, wife also reports ongoing, constant generalized body aches. States "hes just been looking like he is in a trance". No recent chills. Pt has a chronic foley catheter in place due to lumbar stenosis. Wife states he typically is talkative and much more alert/active.  PCP: Kandice Hams, MD   Past Medical History  Diagnosis Date  . Generalized weakness   . Hypokalemia   . Renal disorder   . Hyperlipidemia   . Vitamin D deficiency   . Bell's palsy   . Lumbar stenosis   . Hypertension   . Depression   . Dementia   . Prostate cancer ~ 2004    "had radiation tx" (11/24/2012)  . DVT (deep venous thrombosis) 11/24/2012    Acute DVT in the rightcommon femoral vein and acute DVT involving the leftcommon femorall,profinda popliteal and posterior tibial vein 2013. Pt seen by hematology in 2014. W/S revealed continued clot in left leg  . Asthma     "as a child, real bad" (11/24/2012)  . History of bleeding ulcers ~ 1956    "in hospital for ~ 1 month" (11/24/2012)  . Recurrent UTI (urinary tract infection) 2013-now    "lots" (11/24/2012)  . DJD (degenerative joint disease)   . Arthritis     "joints" (11/24/2012)  . Anxiety   . Hyperaldosteronism   . CHF  (congestive heart failure)     Diastolic dysfunction  . Vitamin B12 deficiency   . Spinal stenosis     Dr Christella Noa  . Dilated aortic root   . Mild aortic stenosis     echo 2012  . Atrial fibrillation    Past Surgical History  Procedure Laterality Date  . Total knee arthroplasty Left 02/2010  . Back surgery  2008    "for stenosis" (11/24/2012)  . Esophagogastroduodenoscopy Left 02/16/2013    Procedure: ESOPHAGOGASTRODUODENOSCOPY (EGD);  Surgeon: Arta Silence, MD;  Location: [facility] ENDOSCOPY;  Service: Endoscopy;  Laterality: Left;   Family History  Problem Relation Age of Onset  . Heart disease Mother   . Diabetes Father    Social History  Substance Use Topics  . Smoking status: Former Smoker -- 10 years    Types: Cigarettes  . Smokeless tobacco: Never Used     Comment: 11/24/2012 "quit smoking cigarettes in ~ 1956"  . Alcohol Use: Yes     Comment: occasoinal wine    Review of Systems  Unable to perform ROS: Mental status change      Allergies  Contrast media and Iohexol  Home Medications   Prior to Admission medications   Medication Sig Start Date End Date Taking? Authorizing Provider  acetaminophen (TYLENOL) 500 MG tablet Take 1,000 mg by mouth  every 6 (six) hours as needed for moderate pain, fever or headache.    Historical Provider, MD  ALPRAZolam Duanne Moron) 0.5 MG tablet Take 0.5 mg by mouth 3 (three) times daily as needed for anxiety. For anxiety.    Historical Provider, MD  aMILoride (MIDAMOR) 5 MG tablet Take 10 mg by mouth 2 (two) times daily.     Historical Provider, MD  Calcium-Vitamin D (CALTRATE 600 PLUS-VIT D PO) Take 1 tablet by mouth daily.     Historical Provider, MD  cefpodoxime (VANTIN) 200 MG tablet Take 1 tablet (200 mg total) by mouth every 12 (twelve) hours. For 6 more days starting tonight. 06/30/14   Bonnielee Haff, MD  cholecalciferol (VITAMIN D) 1000 UNITS tablet Take 1,000 Units by mouth daily.    Historical Provider, MD  citalopram (CELEXA) 20 MG  tablet Take 20 mg by mouth daily. 11/11/12   Historical Provider, MD  Cyanocobalamin (VITAMIN B 12 PO) Take 1,000 mg by mouth daily.    Historical Provider, MD  docusate sodium 100 MG CAPS Take 100 mg by mouth 2 (two) times daily. 08/28/13   Bobby Rumpf [place], PA-C  ferrous sulfate 324 (65 FE) MG TBEC Take 1 tablet by mouth daily.     Historical Provider, MD  Glucosamine-Chondroit-Vit C-Mn (GLUCOSAMINE CHONDR 1500 COMPLX) CAPS Take 1 capsule by mouth daily.    Historical Provider, MD  memantine (NAMENDA XR) 28 MG CP24 24 hr capsule Take 28 mg by mouth daily.    Historical Provider, MD  metoprolol tartrate (LOPRESSOR) 25 MG tablet Take 0.5 tablets (12.5 mg total) by mouth 2 (two) times daily. 06/30/14 06/30/15  Bonnielee Haff, MD  pantoprazole (PROTONIX) 40 MG tablet Take 40 mg by mouth daily.    Historical Provider, MD  potassium chloride SA (K-DUR,KLOR-CON) 20 MEQ tablet Take 20 mEq by mouth 2 (two) times daily.    Historical Provider, MD  Tamsulosin HCl (FLOMAX) 0.4 MG CAPS Take 0.8 mg by mouth at bedtime.    Historical Provider, MD  warfarin (COUMADIN) 2 MG tablet Take 2-3 mg by mouth every morning. 2 mg every day except on Saturday and Sunday patient to take 3 mg    Historical Provider, MD   Triage Vitals: BP 155/84 mmHg  Pulse 87  Temp(Src) 99.3 F (37.4 C) (Rectal)  Resp 20  Ht 6' (1.829 m)  Wt 158 lb (71.668 kg)  BMI 21.42 kg/m2  SpO2 98%   Physical Exam  Constitutional: He appears well-developed and well-nourished.  HENT:  Head: Normocephalic and atraumatic.  Eyes: EOM are normal. Pupils are equal, round, and reactive to light.  Neck: Normal range of motion. Neck supple. No JVD present.  Cardiovascular: Normal rate, regular rhythm and normal heart sounds.   No murmur heard. Pulmonary/Chest: Effort normal and breath sounds normal. He has no wheezes. He has no rales. He exhibits no tenderness.  Abdominal: Soft. He exhibits no distension and no mass. There is tenderness. There is no  rebound and no guarding.  Mild tenderness to LLQ  Musculoskeletal: Normal range of motion. He exhibits no edema.  Lymphadenopathy:    He has no cervical adenopathy.  Neurological: No cranial nerve deficit.  Not oriented to person, place, or time  No focal, motor, or sensory deficits   Skin: Skin is warm and dry. No rash noted.  Nursing note and vitals reviewed.   ED Course  Procedures (including critical care time)  DIAGNOSTIC STUDIES: Oxygen Saturation is 98% on RA, Normal by my interpretation.  COORDINATION OF CARE: 2:04 AM- Will give fluids. Will order CT abdomen pelvis without contrast, DG chest port 1 view, urinalysis, i-stat CG4 lactic acid, lipase, CMP, and CBC. Discussed treatment plan with pt at bedside and pt agreed to plan.     Labs Review Results for orders placed or performed during the hospital encounter of 10/22/14  Comprehensive metabolic panel  Result Value Ref Range   Sodium 145 135 - 145 mmol/L   Potassium 5.1 3.5 - 5.1 mmol/L   Chloride 108 101 - 111 mmol/L   CO2 26 22 - 32 mmol/L   Glucose, Bld 110 (H) 65 - 99 mg/dL   BUN 47 (H) 6 - 20 mg/dL   Creatinine, Ser 2.71 (H) 0.61 - 1.24 mg/dL   Calcium 10.3 8.9 - 10.3 mg/dL   Total Protein 6.0 (L) 6.5 - 8.1 g/dL   Albumin 3.1 (L) 3.5 - 5.0 g/dL   AST 30 15 - 41 U/L   ALT 14 (L) 17 - 63 U/L   Alkaline Phosphatase 43 38 - 126 U/L   Total Bilirubin 0.3 0.3 - 1.2 mg/dL   GFR calc non Af Amer 19 (L) >60 mL/min   GFR calc Af Amer 22 (L) >60 mL/min   Anion gap 11 5 - 15  CBC with Differential  Result Value Ref Range   WBC 21.7 (H) 4.0 - 10.5 K/uL   RBC 3.95 (L) 4.22 - 5.81 MIL/uL   Hemoglobin 12.1 (L) 13.0 - 17.0 g/dL   HCT 38.1 (L) 39.0 - 52.0 %   MCV 96.5 78.0 - 100.0 fL   MCH 30.6 26.0 - 34.0 pg   MCHC 31.8 30.0 - 36.0 g/dL   RDW 15.0 11.5 - 15.5 %   Platelets 128 (L) 150 - 400 K/uL   Neutrophils Relative % 90 %   Lymphocytes Relative 3 %   Monocytes Relative 7 %   Eosinophils Relative 0 %    Basophils Relative 0 %   Neutro Abs 19.5 (H) 1.7 - 7.7 K/uL   Lymphs Abs 0.7 0.7 - 4.0 K/uL   Monocytes Absolute 1.5 (H) 0.1 - 1.0 K/uL   Eosinophils Absolute 0.0 0.0 - 0.7 K/uL   Basophils Absolute 0.0 0.0 - 0.1 K/uL   RBC Morphology TEARDROP CELLS    WBC Morphology MILD LEFT SHIFT (1-5% METAS, OCC MYELO, OCC BANDS)   Urinalysis, Routine w reflex microscopic  Result Value Ref Range   Color, Urine YELLOW YELLOW   APPearance TURBID (A) CLEAR   Specific Gravity, Urine 1.020 1.005 - 1.030   pH 7.5 5.0 - 8.0   Glucose, UA NEGATIVE NEGATIVE mg/dL   Hgb urine dipstick LARGE (A) NEGATIVE   Bilirubin Urine NEGATIVE NEGATIVE   Ketones, ur 15 (A) NEGATIVE mg/dL   Protein, ur 100 (A) NEGATIVE mg/dL   Urobilinogen, UA 0.2 0.0 - 1.0 mg/dL   Nitrite NEGATIVE NEGATIVE   Leukocytes, UA LARGE (A) NEGATIVE  Lipase, blood  Result Value Ref Range   Lipase 16 (L) 22 - 51 U/L  Urine microscopic-add on  Result Value Ref Range   WBC, UA 21-50 <3 WBC/hpf   RBC / HPF 21-50 <3 RBC/hpf   Bacteria, UA MANY (A) RARE   Crystals TRIPLE PHOSPHATE CRYSTALS (A) NEGATIVE   Urine-Other MUCOUS PRESENT   I-Stat CG4 Lactic Acid, ED  Result Value Ref Range   Lactic Acid, Venous 2.31 (HH) 0.5 - 2.0 mmol/L   Comment NOTIFIED PHYSICIAN    Imaging Review Ct Abdomen Pelvis  Wo Contrast  10/22/2014   CLINICAL DATA:  Left lower quadrant tenderness. History of prostate cancer. No IV contrast material due to allergy. Altered mental status.  EXAM: CT ABDOMEN AND PELVIS WITHOUT CONTRAST  TECHNIQUE: Multidetector CT imaging of the abdomen and pelvis was performed following the standard protocol without IV contrast.  COMPARISON:  None.  FINDINGS: Examination technically limited due to motion artifact. Minimal bilateral pleural effusions. Coronary artery calcifications.  Bilateral adrenal gland nodules measuring 13 mm on the right eighth and 3.1 cm on the left. Hounsfield unit measurements are consistent with fat containing  adenomas. The unenhanced appearance of the liver, spleen, pancreas, kidneys, inferior vena cava, and retroperitoneal lymph nodes is unremarkable. Calcification of abdominal aorta without aneurysm. Small umbilical hernia containing fat. Stomach is mildly distended without wall thickening. Contrast material flows through to the colon without evidence of small bowel obstruction. No small bowel wall thickening or distention. Stool-filled colon without distention. No free air or free fluid in the abdomen.  Pelvis: Foley catheter in the bladder accounts for gas in the bladder. No bladder wall thickening. Small bladder diverticula. Prostate gland is mildly enlarged at 3.6 x 4.5 cm diameter. Prostate gland is slightly asymmetric towards the left posteriorly. This may correlate to patient's history of prostate cancer. Surgical clips are present in the pelvis. Pelvic and groin lymph nodes are present without pathologic enlargement. No free or loculated pelvic fluid collections. No pelvic mass or lymphadenopathy. Rectosigmoid colon is mostly decompressed. Suggestion of wall thickening in the rectum may indicate proctitis. Small lipoma in the left iliacus muscle. Diffuse bone demineralization. Degenerative changes in the spine. No definite evidence of any bone metastasis.  IMPRESSION: Bilateral adrenal gland nodules consistent with adenomas. Thickening of the wall of the rectum may indicate proctitis. Diffuse bone demineralization. No definite evidence of bone metastasis. Mild asymmetric enlargement of the prostate gland.   Electronically Signed   By: Lucienne Capers M.D.   On: 10/22/2014 05:13   Dg Chest Port 1 View  10/22/2014   CLINICAL DATA:  81 year old male with altered mental status. Initial encounter.  EXAM: PORTABLE CHEST 1 VIEW  COMPARISON:  06/28/2014.  FINDINGS: Portable AP semi upright view at 0248 hours. Low but mildly improved lung volumes compared to prior. Stable cardiac size and mediastinal contours.  Allowing for portable technique, the lungs are clear. No pneumothorax or pleural effusion identified.  IMPRESSION: No acute cardiopulmonary abnormality.   Electronically Signed   By: Genevie Ann M.D.   On: 10/22/2014 02:57   I have personally reviewed and evaluated these images and lab results as part of my medical decision-making.   MDM   Final diagnoses:  Urinary tract infection with hematuria, site unspecified  Elevated lactic acid level  Acute on chronic renal insufficiency (HCC)  Normochromic normocytic anemia  Altered mental status, unspecified altered mental status type    Altered mentation with report of fever. He has an indwelling Foley catheter which would make urinary tract infection very likely. He is not having a fever in the ED. Lactic acid is only minimally elevated. He is given IV hydration. Marked leukocytosis is noted which is also suggestive of infection. Also, metabolic panel has come back showing evidence of acute kidney injury with probable dehydration. He is being sent for CT scan of abdomen and pelvis to try to evaluate his abdominal pain. He is started on antibiotics for urinary tract infection and will need to be admitted.  CT showed no definite acute process. Lactic acid  has returned to baseline with hydration. Case is discussed with Dr. Blaine Hamper of [facility] who agrees to admit the patient.  I, Tresean Mattix, personally performed the services described in this documentation. All medical record entries made by the scribe were at my direction and in my presence.  I have reviewed the chart and discharge instructions and agree that the record reflects my personal performance and is accurate and complete. Elige Shouse.  71/29/2909. 4:49 AM.      Delora Fuel, MD 03/13/47 [id number]

## 2014-10-22 NOTE — Progress Notes (Addendum)
ANTICOAGULATION CONSULT NOTE - Initial Consult  Pharmacy Consult for Coumadin Indication: atrial fibrillation  Allergies  Allergen Reactions  . Contrast Media [Iodinated Diagnostic Agents]     Pt blacked out  . Iohexol      Desc: sob, throat swelling (1988 gdc) pt requires full premeds and does well, JB 8/14/7     Patient Measurements: Height: 6' (182.9 cm) Weight: 158 lb (71.668 kg) IBW/kg (Calculated) : 77.6  Vital Signs: Temp: 99.3 F (37.4 C) (10/10 0209) Temp Source: Rectal (10/10 0209) BP: 136/78 mmHg (10/10 0600) Pulse Rate: 83 (10/10 0600)  Labs:  Recent Labs  10/22/14 0232  HGB 12.1*  HCT 38.1*  PLT 128*  CREATININE 2.71*    Estimated Creatinine Clearance: 18.7 mL/min (by C-G formula based on Cr of 2.71).   Medical History: Past Medical History  Diagnosis Date  . Generalized weakness   . Hypokalemia   . Renal disorder   . Hyperlipidemia   . Vitamin D deficiency   . Bell's palsy   . Lumbar stenosis   . Hypertension   . Depression   . Dementia   . Prostate cancer ([place]) ~ 2004    "had radiation tx" (11/24/2012)  . DVT (deep venous thrombosis) ([place]) 11/24/2012    Acute DVT in the rightcommon femoral vein and acute DVT involving the leftcommon femorall,profinda popliteal and posterior tibial vein 2013. Pt seen by hematology in 2014. W/S revealed continued clot in left leg  . Asthma     "as a child, real bad" (11/24/2012)  . History of bleeding ulcers ~ 1956    "in hospital for ~ 1 month" (11/24/2012)  . Recurrent UTI (urinary tract infection) 2013-now    "lots" (11/24/2012)  . DJD (degenerative joint disease)   . Arthritis     "joints" (11/24/2012)  . Anxiety   . Hyperaldosteronism ([place])   . CHF (congestive heart failure) (HCC)     Diastolic dysfunction  . Vitamin B12 deficiency   . Spinal stenosis     Dr Christella Noa  . Dilated aortic root ([place])   . Mild aortic stenosis     echo 2012  . Atrial fibrillation [facility])     Assessment: 81yo male  c/o fever and lethargy, found to have UTI (has been on Cipro), to continue Coumadin for Afib during admission; last dose of Coumadin taken 10/9 PTA.  Goal of Therapy:  INR 2-3   Plan:  Will obtain current INR prior to dosing Coumadin.  Wynona Neat, PharmD, BCPS  10/22/2014,6:20 AM  ADDENDUM:  Coumadin alternating 2mg  and 3mg  daily PTA for Afib. Last diose was taken on 10/9. INR supratherapeutic today at 3.55. Hgb low at 12.1, plts 128.  Plan: Hold coumadin tonight Monitor daily INR, CBC, s/s of bleed

## 2014-10-22 NOTE — Progress Notes (Signed)
ANTIBIOTIC CONSULT NOTE - INITIAL  Pharmacy Consult for cipro Indication: UTI, possible proctitis  Allergies  Allergen Reactions  . Contrast Media [Iodinated Diagnostic Agents]     Pt blacked out  . Iohexol      Desc: sob, throat swelling (1988 gdc) pt requires full premeds and does well, JB 8/14/7     Patient Measurements: Height: 6' (182.9 cm) Weight: 158 lb (71.668 kg) IBW/kg (Calculated) : 77.6 Adjusted Body Weight:   Vital Signs: Temp: 99.3 F (37.4 C) (10/10 0209) Temp Source: Rectal (10/10 0209) BP: 136/78 mmHg (10/10 0600) Pulse Rate: 83 (10/10 0600) Intake/Output from previous day:   Intake/Output from this shift:    Labs:  Recent Labs  10/22/14 0232  WBC 21.7*  HGB 12.1*  PLT 128*  CREATININE 2.71*   Estimated Creatinine Clearance: 18.7 mL/min (by C-G formula based on Cr of 2.71). No results for input(s): VANCOTROUGH, VANCOPEAK, VANCORANDOM, GENTTROUGH, GENTPEAK, GENTRANDOM, TOBRATROUGH, TOBRAPEAK, TOBRARND, AMIKACINPEAK, AMIKACINTROU, AMIKACIN in the last 72 hours.   Microbiology: No results found for this or any previous visit (from the past 720 hour(s)).  Medical History: Past Medical History  Diagnosis Date  . Generalized weakness   . Hypokalemia   . Renal disorder   . Hyperlipidemia   . Vitamin D deficiency   . Bell's palsy   . Lumbar stenosis   . Hypertension   . Depression   . Dementia   . Prostate cancer ([place]) ~ 2004    "had radiation tx" (11/24/2012)  . DVT (deep venous thrombosis) ([place]) 11/24/2012    Acute DVT in the rightcommon femoral vein and acute DVT involving the leftcommon femorall,profinda popliteal and posterior tibial vein 2013. Pt seen by hematology in 2014. W/S revealed continued clot in left leg  . Asthma     "as a child, real bad" (11/24/2012)  . History of bleeding ulcers ~ 1956    "in hospital for ~ 1 month" (11/24/2012)  . Recurrent UTI (urinary tract infection) 2013-now    "lots" (11/24/2012)  . DJD  (degenerative joint disease)   . Arthritis     "joints" (11/24/2012)  . Anxiety   . Hyperaldosteronism ([place])   . CHF (congestive heart failure) (HCC)     Diastolic dysfunction  . Vitamin B12 deficiency   . Spinal stenosis     Dr Christella Noa  . Dilated aortic root ([place])   . Mild aortic stenosis     echo 2012  . Atrial fibrillation (HCC)     Medications:  Anti-infectives    Start     Dose/Rate Route Frequency Ordered Stop   10/22/14 0630  cefTRIAXone (ROCEPHIN) 1 g in dextrose 5 % 50 mL IVPB  Status:  Discontinued     1 g 100 mL/hr over 30 Minutes Intravenous Every 24 hours 10/22/14 0619 10/22/14 0622   10/22/14 0630  metroNIDAZOLE (FLAGYL) IVPB 500 mg     500 mg 100 mL/hr over 60 Minutes Intravenous Every 8 hours 10/22/14 0626     10/22/14 0630  ciprofloxacin (CIPRO) IVPB 400 mg     400 mg 200 mL/hr over 60 Minutes Intravenous Every 24 hours 10/22/14 0628     10/22/14 0500  cefTRIAXone (ROCEPHIN) 1 g in dextrose 5 % 50 mL IVPB     1 g 100 mL/hr over 30 Minutes Intravenous  Once 10/22/14 0445 10/22/14 0538     Assessment: 18 yom presented to the ED with fever and lethargy. To continue cipro for possible UTI. Pt is afebrile and  WBC is elevated 21.7. SCr is also elevated at 2.71.   Cipro PTA>>  Goal of Therapy:  Eradication of infection  Plan:  - Cipro 400mg  IV Q24H - F/u renal fxn, C&S, clinical status  Yemariam Magar, Rande Lawman 10/22/2014,6:29 AM

## 2014-10-22 NOTE — ED Notes (Signed)
Pt. Is from home. Has been running a fever for 24 hrs per wife. Unsure of what temp was with wife. Pt. Has a chronic foley due to lumbar stenosis. Wife also states pt. Has been more lethargic today and has not given anything for the temperature.

## 2014-10-23 DIAGNOSIS — I10 Essential (primary) hypertension: Secondary | ICD-10-CM

## 2014-10-23 DIAGNOSIS — N189 Chronic kidney disease, unspecified: Secondary | ICD-10-CM

## 2014-10-23 DIAGNOSIS — N289 Disorder of kidney and ureter, unspecified: Secondary | ICD-10-CM

## 2014-10-23 DIAGNOSIS — A419 Sepsis, unspecified organism: Secondary | ICD-10-CM

## 2014-10-23 DIAGNOSIS — M4806 Spinal stenosis, lumbar region: Secondary | ICD-10-CM

## 2014-10-23 LAB — CBC
HEMATOCRIT: 31.5 % — AB (ref 39.0–52.0)
Hemoglobin: 9.9 g/dL — ABNORMAL LOW (ref 13.0–17.0)
MCH: 30.5 pg (ref 26.0–34.0)
MCHC: 31.4 g/dL (ref 30.0–36.0)
MCV: 96.9 fL (ref 78.0–100.0)
Platelets: 94 10*3/uL — ABNORMAL LOW (ref 150–400)
RBC: 3.25 MIL/uL — AB (ref 4.22–5.81)
RDW: 15.1 % (ref 11.5–15.5)
WBC: 17.5 10*3/uL — AB (ref 4.0–10.5)

## 2014-10-23 LAB — COMPREHENSIVE METABOLIC PANEL
ALT: 12 U/L — AB (ref 17–63)
AST: 20 U/L (ref 15–41)
Albumin: 2.4 g/dL — ABNORMAL LOW (ref 3.5–5.0)
Alkaline Phosphatase: 36 U/L — ABNORMAL LOW (ref 38–126)
Anion gap: 8 (ref 5–15)
BUN: 45 mg/dL — AB (ref 6–20)
CO2: 24 mmol/L (ref 22–32)
CREATININE: 1.89 mg/dL — AB (ref 0.61–1.24)
Calcium: 9.1 mg/dL (ref 8.9–10.3)
Chloride: 111 mmol/L (ref 101–111)
GFR calc Af Amer: 35 mL/min — ABNORMAL LOW (ref >60)
GFR, EST NON AFRICAN AMERICAN: 30 mL/min — AB (ref >60)
Glucose, Bld: 80 mg/dL (ref 65–99)
POTASSIUM: 4 mmol/L (ref 3.5–5.1)
SODIUM: 143 mmol/L (ref 135–145)
Total Bilirubin: 0.4 mg/dL (ref 0.3–1.2)
Total Protein: 4.9 g/dL — ABNORMAL LOW (ref 6.5–8.1)

## 2014-10-23 LAB — URINE CULTURE

## 2014-10-23 LAB — PROTIME-INR
INR: 3.15 — AB (ref 0.00–1.49)
PROTHROMBIN TIME: 31.7 s — AB (ref 11.6–15.2)

## 2014-10-23 LAB — GLUCOSE, CAPILLARY: Glucose-Capillary: 76 mg/dL (ref 65–99)

## 2014-10-23 LAB — UREA NITROGEN, URINE: UREA NITROGEN UR: 283 mg/dL

## 2014-10-23 LAB — C DIFFICILE QUICK SCREEN W PCR REFLEX
C DIFFICILE (CDIFF) INTERP: NEGATIVE
C Diff antigen: NEGATIVE
C Diff toxin: NEGATIVE

## 2014-10-23 LAB — MRSA PCR SCREENING: MRSA by PCR: NEGATIVE

## 2014-10-23 MED ORDER — SODIUM CHLORIDE 0.9 % IV SOLN
1000.0000 mL | INTRAVENOUS | Status: AC
  Administered 2014-10-23: 1000 mL via INTRAVENOUS

## 2014-10-23 MED ORDER — WARFARIN SODIUM 2 MG PO TABS
2.0000 mg | ORAL_TABLET | Freq: Once | ORAL | Status: AC
Start: 2014-10-23 — End: 2014-10-23
  Administered 2014-10-23: 2 mg via ORAL
  Filled 2014-10-23: qty 1

## 2014-10-23 NOTE — Progress Notes (Signed)
[facility] PROGRESS NOTE  Joshua Rodgers ZTI:[id number] DOB: 1923-06-25 DOA: 10/22/2014 PCP: Kandice Hams, MD  Subjective: Today the patient is much more alert and responsive. Denies fever, chills, N/V/D, or abdominal pain.   Assessment/Plan: Principal Problem:  UTI with sepsis: Patient presented in sepsis with elevated lactic acid (2.31), leukocytosis (21.7), and tachypnea. UA positive for UTI. Likely secondary to chronic dwelling catheter and possible proctitis (see abdominal pain below). Patient was started on cipro/flagyl and IVF. Urine and blood culture still pending, will amend antibiotics based on results. Patient improving- leukocytosis 17.5, lactate 1.5 and afebrile. Continue antibiotics and monitor/follow clinical course.   Active Problems:  Abdominal pain: Present since 10/20/14 with 1 episode of diarrhea on same day. Likely secondary to proctitis as shown on CT. C. Diff cultures pending. Resolved today. Continue Cipro/flagyl and monitor.   AKI: BUN/Cr 47/2.71 on admission, likely pre-renal azotemia from UTI/proctitis and sepsis. Improving today to 45/1.89. IVF discontinued d/t CHF. Encouraged PO intake and continue to monitor.   Acute encephalopathy: Likely secondary to UTI/sepsis. Patient is much improved today. Continue treating underlying cause and frequent neuro checks.   Dementia: Chronic, managed at home with Memantine. Continue supportive care and monitor.   Essential HTN: Chronic, managed at home with metoprolol, amlodapine, and amiloride. Metoprolol continued, but others held at admission with to sepsis and high risk of hypotension. BP elevating today (165/89), but will not re-initiate at home HTN medications at this time d/t sepsis- Consider restarting in a couple days if continued. Continue PRN hydralazine and metoprolol, monitor.   Atrial Fibrillation: Chronic, patient is currently on Coumadin and metoprolol. (CHA2DS2-VASc Score 4). INR 3.15 today (goal 2-3)  and HR well controlled. Continue at home medications and monitor, pharmacy following for INR managment.   Chronic diastolic CHF: Chronic, managed at home with amiloride. 2-D echo on 06/28/14 showed EF 55-60 percent with grade 1 diastolic dysfunction. Amiloride held on admission d/t high risk of hypotension with sepsis. Metoprolol continued. 1+ pitting edema in the lower extremities today, IVF discontinued. BNP 541. Continue current regimen and continue.   GERD: Managed at home with PPI, switched to pepcid IV on admission. C diff negative. No complications. Continue, monitor.   Mild Anemia: Hgb 12.1 on admission and continues to decrease today (9.9). Likely secondary to acute illness/sepsis. No evidence of active GI bleed, LFTs unremarkable. No transfusion needed at this time, consider if hgb < 8. Continue to monitor.  Thrombocytopenia: Mild 128 on admission, continued to decrease to 94 today. Likely secondary to acute illness/sepsis. Continue to monitor.   Chronic B/L Lower ext weakness with foot drop: Secondary to severe spinal stenosis, bed to wheelchair bound at baseline  Code Status: Full Family Communication: Wife and daughter at bedside Disposition Plan: Remain inpatient   Consultants:  None  Procedures:  None  Antibiotics: Anti-infectives    Start     Dose/Rate Route Frequency Ordered Stop   10/22/14 0630  cefTRIAXone (ROCEPHIN) 1 g in dextrose 5 % 50 mL IVPB  Status:  Discontinued     1 g 100 mL/hr over 30 Minutes Intravenous Every 24 hours 10/22/14 0619 10/22/14 0622   10/22/14 0630  metroNIDAZOLE (FLAGYL) IVPB 500 mg     500 mg 100 mL/hr over 60 Minutes Intravenous Every 8 hours 10/22/14 0626     10/22/14 0630  ciprofloxacin (CIPRO) IVPB 400 mg     400 mg 200 mL/hr over 60 Minutes Intravenous Every 24 hours 10/22/14 0628     10/22/14 0500  cefTRIAXone (ROCEPHIN) 1 g in dextrose 5 % 50 mL IVPB     1 g 100 mL/hr over 30 Minutes Intravenous  Once 10/22/14 0445 10/22/14  0538       Objective: Filed Vitals:   10/23/14 0547  BP: 165/89  Pulse: 81  Temp: 97.7 F (36.5 C)  Resp: 20    Intake/Output Summary (Last 24 hours) at 10/23/14 9373 Last data filed at 10/23/14 0930  Gross per 24 hour  Intake 1201.67 ml  Output    900 ml  Net 301.67 ml   Filed Weights   10/22/14 0209 10/22/14 0821  Weight: 71.668 kg (158 lb) 70.6 kg (155 lb 10.3 oz)    Exam:   General: Frail appearing man, sleeping in bed, no acute distress  Cardiovascular: RRR, no m/r/g appreciated. No peripheral edema  Respiratory: CTA b/l. No wheeze or crackles   Abdomen: Soft, non-distended, non-tender. + BS   Neuro:  Alert, oriented. No focal neurological deficits noted. Bilateral leg weakness, but moves all extremities appropriately   Data Reviewed: Basic Metabolic Panel:  Recent Labs Lab 10/22/14 0232 10/23/14 0658  NA 145 143  K 5.1 4.0  CL 108 111  CO2 26 24  GLUCOSE 110* 80  BUN 47* 45*  CREATININE 2.71* 1.89*  CALCIUM 10.3 9.1   Liver Function Tests:  Recent Labs Lab 10/22/14 0232 10/23/14 0658  AST 30 20  ALT 14* 12*  ALKPHOS 43 36*  BILITOT 0.3 0.4  PROT 6.0* 4.9*  ALBUMIN 3.1* 2.4*    Recent Labs Lab 10/22/14 0232  LIPASE 16*   No results for input(s): AMMONIA in the last 168 hours. CBC:  Recent Labs Lab 10/22/14 0232 10/23/14 0658  WBC 21.7* 17.5*  NEUTROABS 19.5*  --   HGB 12.1* 9.9*  HCT 38.1* 31.5*  MCV 96.5 96.9  PLT 128* 94*   Cardiac Enzymes: No results for input(s): CKTOTAL, CKMB, CKMBINDEX, TROPONINI in the last 168 hours. BNP (last 3 results)  Recent Labs  06/28/14 0435 10/22/14 0657  BNP 100.6* 541.9*    ProBNP (last 3 results) No results for input(s): PROBNP in the last 8760 hours.  CBG:  Recent Labs Lab 10/22/14 0845 10/23/14 0743  GLUCAP 88 76    Recent Results (from the past 240 hour(s))  Culture, blood (x 2)     Status: None (Preliminary result)   Collection Time: 10/22/14  9:00 AM   Result Value Ref Range Status   Specimen Description BLOOD RIGHT ANTECUBITAL  Final   Special Requests BOTTLES DRAWN AEROBIC AND ANAEROBIC 5CC  Final   Culture PENDING  Incomplete   Report Status PENDING  Incomplete  C difficile quick scan w PCR reflex     Status: None   Collection Time: 10/22/14 11:59 PM  Result Value Ref Range Status   C Diff antigen NEGATIVE NEGATIVE Final   C Diff toxin NEGATIVE NEGATIVE Final   C Diff interpretation Negative for toxigenic C. difficile  Final     Studies: Ct Abdomen Pelvis Wo Contrast  10/22/2014   CLINICAL DATA:  Left lower quadrant tenderness. History of prostate cancer. No IV contrast material due to allergy. Altered mental status.  EXAM: CT ABDOMEN AND PELVIS WITHOUT CONTRAST  TECHNIQUE: Multidetector CT imaging of the abdomen and pelvis was performed following the standard protocol without IV contrast.  COMPARISON:  None.  FINDINGS: Examination technically limited due to motion artifact. Minimal bilateral pleural effusions. Coronary artery calcifications.  Bilateral adrenal gland nodules measuring 13 mm  on the right eighth and 3.1 cm on the left. Hounsfield unit measurements are consistent with fat containing adenomas. The unenhanced appearance of the liver, spleen, pancreas, kidneys, inferior vena cava, and retroperitoneal lymph nodes is unremarkable. Calcification of abdominal aorta without aneurysm. Small umbilical hernia containing fat. Stomach is mildly distended without wall thickening. Contrast material flows through to the colon without evidence of small bowel obstruction. No small bowel wall thickening or distention. Stool-filled colon without distention. No free air or free fluid in the abdomen.  Pelvis: Foley catheter in the bladder accounts for gas in the bladder. No bladder wall thickening. Small bladder diverticula. Prostate gland is mildly enlarged at 3.6 x 4.5 cm diameter. Prostate gland is slightly asymmetric towards the left posteriorly.  This may correlate to patient's history of prostate cancer. Surgical clips are present in the pelvis. Pelvic and groin lymph nodes are present without pathologic enlargement. No free or loculated pelvic fluid collections. No pelvic mass or lymphadenopathy. Rectosigmoid colon is mostly decompressed. Suggestion of wall thickening in the rectum may indicate proctitis. Small lipoma in the left iliacus muscle. Diffuse bone demineralization. Degenerative changes in the spine. No definite evidence of any bone metastasis.  IMPRESSION: Bilateral adrenal gland nodules consistent with adenomas. Thickening of the wall of the rectum may indicate proctitis. Diffuse bone demineralization. No definite evidence of bone metastasis. Mild asymmetric enlargement of the prostate gland.   Electronically Signed   By: Lucienne Capers M.D.   On: 10/22/2014 05:13   Dg Chest Port 1 View  10/22/2014   CLINICAL DATA:  81 year old male with altered mental status. Initial encounter.  EXAM: PORTABLE CHEST 1 VIEW  COMPARISON:  06/28/2014.  FINDINGS: Portable AP semi upright view at 0248 hours. Low but mildly improved lung volumes compared to prior. Stable cardiac size and mediastinal contours. Allowing for portable technique, the lungs are clear. No pneumothorax or pleural effusion identified.  IMPRESSION: No acute cardiopulmonary abnormality.   Electronically Signed   By: Genevie Ann M.D.   On: 10/22/2014 02:57    Scheduled Meds: . calcium citrate  2 tablet Oral BID  . cholecalciferol  1,000 Units Oral BID  . ciprofloxacin  400 mg Intravenous Q24H  . citalopram  10 mg Oral Daily  . famotidine (PEPCID) IV  20 mg Intravenous Q12H  . ferrous sulfate  325 mg Oral BID  . memantine  28 mg Oral Daily  . metoprolol tartrate  12.5 mg Oral BID  . metronidazole  500 mg Intravenous Q8H  . sodium chloride  1,000 mL Intravenous Once  . sodium chloride  3 mL Intravenous Q12H  . vitamin B-12  1,000 mcg Oral Daily  . warfarin  2 mg Oral ONCE-1800   . Warfarin - Pharmacist Dosing Inpatient   Does not apply q1800   Continuous Infusions: . sodium chloride 1,000 mL (10/23/14 0947)    Principal Problem:   UTI (lower urinary tract infection) Active Problems:   Lumbar stenosis   Dementia   HTN (hypertension)   History of DVT (deep vein thrombosis)   Dilated aortic root (HCC)   Chronic diastolic CHF (congestive heart failure) (HCC)   Abdominal pain   Sepsis ([place])   Diarrhea   Chronic indwelling Foley catheter   Chronic atrial fibrillation ([place])   Acute encephalopathy      Joshua Rodgers, Student-PA  [facility] If 7PM-7AM, please contact night-coverage at [email or web address], password [facility] 10/23/2014, 9:52 AM  LOS: 1 day

## 2014-10-23 NOTE — Progress Notes (Signed)
ANTICOAGULATION CONSULT NOTE - Initial Consult  Pharmacy Consult for Coumadin Indication: atrial fibrillation  Allergies  Allergen Reactions  . Contrast Media [Iodinated Diagnostic Agents]     Pt blacked out  . Iohexol      Desc: sob, throat swelling (1988 gdc) pt requires full premeds and does well, JB 8/14/7     Patient Measurements: Height: 5\' 7"  (170.2 cm) Weight: 155 lb 10.3 oz (70.6 kg) IBW/kg (Calculated) : 66.1  Vital Signs: Temp: 97.7 F (36.5 C) (10/11 0547) Temp Source: Oral (10/11 0547) BP: 165/89 mmHg (10/11 0547) Pulse Rate: 81 (10/11 0547)  Labs:  Recent Labs  10/22/14 0232 10/22/14 0657 10/22/14 0900 10/23/14 0658  HGB 12.1*  --   --  9.9*  HCT 38.1*  --   --  31.5*  PLT 128*  --   --  PENDING  APTT  --  46*  --   --   LABPROT  --   --  34.8* 31.7*  INR  --   --  3.55* 3.15*  CREATININE 2.71*  --   --  1.89*    Estimated Creatinine Clearance: 24.8 mL/min (by C-G formula based on Cr of 1.89).   Medical History: Past Medical History  Diagnosis Date  . Generalized weakness   . Hypokalemia   . Renal disorder   . Hyperlipidemia   . Vitamin D deficiency   . Bell's palsy   . Lumbar stenosis   . Hypertension   . Depression   . Dementia   . Prostate cancer ([place]) ~ 2004    "had radiation tx" (11/24/2012)  . DVT (deep venous thrombosis) ([place]) 11/24/2012    Acute DVT in the rightcommon femoral vein and acute DVT involving the leftcommon femorall,profinda popliteal and posterior tibial vein 2013. Pt seen by hematology in 2014. W/S revealed continued clot in left leg  . Asthma     "as a child, real bad" (11/24/2012)  . History of bleeding ulcers ~ 1956    "in hospital for ~ 1 month" (11/24/2012)  . Recurrent UTI (urinary tract infection) 2013-now    "lots" (11/24/2012)  . DJD (degenerative joint disease)   . Arthritis     "joints" (11/24/2012)  . Anxiety   . Hyperaldosteronism ([place])   . CHF (congestive heart failure) (HCC)     Diastolic  dysfunction  . Vitamin B12 deficiency   . Spinal stenosis     Dr Christella Noa  . Dilated aortic root ([place])   . Mild aortic stenosis     echo 2012  . Atrial fibrillation [facility])     Assessment: 81yo male c/o fever and lethargy, found to have UTI (has been on Cipro), to continue Coumadin for Afib during admission. Coumadin alternating 2mg  and 3mg  daily PTA for Afib. Last dose was taken on 10/9. INR supratherapeutic on admit at 3.55, still remains high but down to 3.15 today.  Goal of Therapy:  INR 2-3   Plan:  Give coumadin 2mg  PO x 1  Monitor daily INR, CBC, s/s of bleed  Elenor Quinones, PharmD Clinical Pharmacist Pager [phone] 10/23/2014 8:18 AM

## 2014-10-23 NOTE — Progress Notes (Signed)
OT Cancellation Note  Patient Details Name: MELINDA POTTINGER MRN: [id number] DOB: 1923/04/08   Cancelled Treatment:    Reason Eval/Treat Not Completed:  (screened) Spoke with pt's wife and pt requires a lot of assist for ADLs at baseline (dressing,bathing,feeding). OT signing off and wife agreeable.  Benito Mccreedy OTR/L [phone] 10/23/2014, 3:54 PM

## 2014-10-23 NOTE — Progress Notes (Signed)
PT Cancellation Note  Patient Details Name: Joshua Rodgers MRN: [id number] DOB: 1923-11-18   Cancelled Treatment:    Reason Eval/Treat Not Completed: Pt's family member declined, no reason specified.  Pt just asleep and agitated when awake today.  Will try again as able 10/12. 10/23/2014  Donnella Sham, [place] [phone]  (pager)   [name], Tessie Fass 10/23/2014, 3:01 PM

## 2014-10-23 NOTE — Evaluation (Signed)
Clinical/Bedside Swallow Evaluation Patient Details  Name: Joshua Rodgers MRN: [id number] Date of Birth: 1923-11-09  Today's Date: 10/23/2014 Time: SLP Start Time (ACUTE ONLY): 59 SLP Stop Time (ACUTE ONLY): 1046 SLP Time Calculation (min) (ACUTE ONLY): 10 min  Past Medical History:  Past Medical History  Diagnosis Date  . Generalized weakness   . Hypokalemia   . Renal disorder   . Hyperlipidemia   . Vitamin D deficiency   . Bell's palsy   . Lumbar stenosis   . Hypertension   . Depression   . Dementia   . Prostate cancer ([place]) ~ 2004    "had radiation tx" (11/24/2012)  . DVT (deep venous thrombosis) ([place]) 11/24/2012    Acute DVT in the rightcommon femoral vein and acute DVT involving the leftcommon femorall,profinda popliteal and posterior tibial vein 2013. Pt seen by hematology in 2014. W/S revealed continued clot in left leg  . Asthma     "as a child, real bad" (11/24/2012)  . History of bleeding ulcers ~ 1956    "in hospital for ~ 1 month" (11/24/2012)  . Recurrent UTI (urinary tract infection) 2013-now    "lots" (11/24/2012)  . DJD (degenerative joint disease)   . Arthritis     "joints" (11/24/2012)  . Anxiety   . Hyperaldosteronism ([place])   . CHF (congestive heart failure) (HCC)     Diastolic dysfunction  . Vitamin B12 deficiency   . Spinal stenosis     Dr Christella Noa  . Dilated aortic root ([place])   . Mild aortic stenosis     echo 2012  . Atrial fibrillation [facility])    Past Surgical History:  Past Surgical History  Procedure Laterality Date  . Total knee arthroplasty Left 02/2010  . Back surgery  2008    "for stenosis" (11/24/2012)  . Esophagogastroduodenoscopy Left 02/16/2013    Procedure: ESOPHAGOGASTRODUODENOSCOPY (EGD);  Surgeon: Arta Silence, MD;  Location: [facility] ENDOSCOPY;  Service: Endoscopy;  Laterality: Left;   HPI:  Joshua Rodgers is a 81 y.o. male with PMH of hypertension, GERD, depression, anxiety, chronic bilateral leg weakness and chronic of Foley  catheter secondary to lumbar stenosis, dementia, prostate cancer, DVT, asthma, GI bleeding 2/2 peptic ulcer, diastolic congestive heart failure (EF 64-68%, grade 1 diastolic dysfunction), atrial fibrillation on Coumadin, who presents with a fever, altered mental status and dominant pain.   Assessment / Plan / Recommendation Clinical Impression  Pt has prolonged mastication with regular textures, requiring several liquid washes to adequately clear. Otherwise, no indicators for aspiration or dysphagia are noted. Pt has frequent eructation that may be indicative of a possible esophageal component. Would continue with Dys 3 diet and thin liquids, SLP to sign off.    Aspiration Risk  Mild    Diet Recommendation Dysphagia 3 (Mech soft);Thin   Medication Administration: Whole meds with puree Compensations: Minimize environmental distractions;Slow rate;Small sips/bites    Other  Recommendations Oral Care Recommendations: Oral care BID   Follow Up Recommendations   n/a    Pertinent Vitals/Pain n/a    SLP Swallow Goals     Swallow Study Prior Functional Status       General Other Pertinent Information: Joshua Rodgers is a 81 y.o. male with PMH of hypertension, GERD, depression, anxiety, chronic bilateral leg weakness and chronic of Foley catheter secondary to lumbar stenosis, dementia, prostate cancer, DVT, asthma, GI bleeding 2/2 peptic ulcer, diastolic congestive heart failure (EF 03-21%, grade 1 diastolic dysfunction), atrial fibrillation on Coumadin, who presents with a  fever, altered mental status and dominant pain. Type of Study: Bedside swallow evaluation Previous Swallow Assessment: previous evaluations suggest adequate oropharyngeal function with possible esophageal component Diet Prior to this Study: Dysphagia 3 (soft);Thin liquids Temperature Spikes Noted: No Respiratory Status: Room air History of Recent Intubation: No Behavior/Cognition: Alert;Cooperative;Pleasant mood;Requires  cueing;Confused Oral Cavity - Dentition: Adequate natural dentition/normal for age Self-Feeding Abilities: Needs assist Patient Positioning: Upright in bed Baseline Vocal Quality: Normal    Oral/Motor/Sensory Function Overall Oral Motor/Sensory Function: Appears within functional limits for tasks assessed   Ice Chips Ice chips: Not tested   Thin Liquid Thin Liquid: Within functional limits Presentation: Straw    Nectar Thick Nectar Thick Liquid: Not tested   Honey Thick Honey Thick Liquid: Not tested   Puree Puree: Within functional limits Presentation: Spoon   Solid    Solid: Impaired Oral Phase Impairments: Impaired mastication      Germain Osgood, M.A. CCC-SLP [phone]  Germain Osgood 10/23/2014,12:06 PM

## 2014-10-24 DIAGNOSIS — G9340 Encephalopathy, unspecified: Secondary | ICD-10-CM

## 2014-10-24 DIAGNOSIS — I482 Chronic atrial fibrillation: Secondary | ICD-10-CM

## 2014-10-24 LAB — GLUCOSE, CAPILLARY: GLUCOSE-CAPILLARY: 92 mg/dL (ref 65–99)

## 2014-10-24 LAB — CBC
HEMATOCRIT: 32.5 % — AB (ref 39.0–52.0)
HEMOGLOBIN: 10.3 g/dL — AB (ref 13.0–17.0)
MCH: 31 pg (ref 26.0–34.0)
MCHC: 31.7 g/dL (ref 30.0–36.0)
MCV: 97.9 fL (ref 78.0–100.0)
Platelets: 91 10*3/uL — ABNORMAL LOW (ref 150–400)
RBC: 3.32 MIL/uL — AB (ref 4.22–5.81)
RDW: 15.2 % (ref 11.5–15.5)
WBC: 15.3 10*3/uL — ABNORMAL HIGH (ref 4.0–10.5)

## 2014-10-24 LAB — BASIC METABOLIC PANEL
Anion gap: 11 (ref 5–15)
BUN: 39 mg/dL — ABNORMAL HIGH (ref 6–20)
CHLORIDE: 108 mmol/L (ref 101–111)
CO2: 25 mmol/L (ref 22–32)
CREATININE: 1.66 mg/dL — AB (ref 0.61–1.24)
Calcium: 9.2 mg/dL (ref 8.9–10.3)
GFR calc non Af Amer: 35 mL/min — ABNORMAL LOW (ref >60)
GFR, EST AFRICAN AMERICAN: 41 mL/min — AB (ref >60)
Glucose, Bld: 100 mg/dL — ABNORMAL HIGH (ref 65–99)
POTASSIUM: 3.6 mmol/L (ref 3.5–5.1)
SODIUM: 144 mmol/L (ref 135–145)

## 2014-10-24 LAB — PROTIME-INR
INR: 2.64 — AB (ref 0.00–1.49)
Prothrombin Time: 27.8 seconds — ABNORMAL HIGH (ref 11.6–15.2)

## 2014-10-24 MED ORDER — AMLODIPINE BESYLATE 10 MG PO TABS
10.0000 mg | ORAL_TABLET | Freq: Every day | ORAL | Status: DC
  Administered 2014-10-24 – 2014-10-25 (×2): 10 mg via ORAL
  Filled 2014-10-24 (×2): qty 1

## 2014-10-24 MED ORDER — AMILORIDE HCL 5 MG PO TABS
10.0000 mg | ORAL_TABLET | Freq: Two times a day (BID) | ORAL | Status: DC
  Filled 2014-10-24 (×2): qty 2

## 2014-10-24 MED ORDER — WARFARIN SODIUM 2 MG PO TABS
2.0000 mg | ORAL_TABLET | Freq: Once | ORAL | Status: AC
  Administered 2014-10-24: 2 mg via ORAL
  Filled 2014-10-24: qty 1

## 2014-10-24 NOTE — Care Management Note (Addendum)
Case Management Note  Patient Details  Name: Joshua Rodgers MRN: [id number] Date of Birth: July 21, 1923  Subjective/Objective:                 Patient admitted with UTI, receiving IV abx. Pt does not recommend HH PT. Patient [facility]. Will place resumption of services order and notify [facility] tomorrow when offices open.   Action/Plan:  Will continue to follow. 10-25-14 Referral made to [facility] for [facility] services to resume.   Expected Discharge Date:                  Expected Discharge Plan:  Home/Self Care  In-House Referral:     Discharge planning Services  CM Consult  Post Acute Care Choice:    Choice offered to:     DME Arranged:    DME Agency:     HH Arranged:    HH Agency:     Status of Service:  In process, will continue to follow  Medicare Important Message Given:    Date Medicare IM Given:    Medicare IM give by:    Date Additional Medicare IM Given:    Additional Medicare Important Message give by:     If discussed at [place] of Stay Meetings, dates discussed:    Additional Comments:  Carles Collet, RN 10/24/2014, 5:57 PM

## 2014-10-24 NOTE — Progress Notes (Signed)
[place] for Coumadin Indication: atrial fibrillation  Allergies  Allergen Reactions  . Contrast Media [Iodinated Diagnostic Agents]     Pt blacked out  . Iohexol      Desc: sob, throat swelling (1988 gdc) pt requires full premeds and does well, JB 8/14/7     Patient Measurements: Height: 5\' 7"  (170.2 cm) Weight: 155 lb 10.3 oz (70.6 kg) IBW/kg (Calculated) : 66.1  Vital Signs: Temp: 98.8 F (37.1 C) (10/12 0602) Temp Source: Oral (10/12 0602) BP: 169/104 mmHg (10/12 0759) Pulse Rate: 71 (10/12 0759)  Labs:  Recent Labs  10/22/14 0232 10/22/14 0657 10/22/14 0900 10/23/14 0658 10/24/14 0656  HGB 12.1*  --   --  9.9* 10.3*  HCT 38.1*  --   --  31.5* 32.5*  PLT 128*  --   --  94* 91*  APTT  --  46*  --   --   --   LABPROT  --   --  34.8* 31.7* 27.8*  INR  --   --  3.55* 3.15* 2.64*  CREATININE 2.71*  --   --  1.89* 1.66*    Estimated Creatinine Clearance: 28.2 mL/min (by C-G formula based on Cr of 1.66).   Medical History: Past Medical History  Diagnosis Date  . Generalized weakness   . Hypokalemia   . Renal disorder   . Hyperlipidemia   . Vitamin D deficiency   . Bell's palsy   . Lumbar stenosis   . Hypertension   . Depression   . Dementia   . Prostate cancer ([place]) ~ 2004    "had radiation tx" (11/24/2012)  . DVT (deep venous thrombosis) ([place]) 11/24/2012    Acute DVT in the rightcommon femoral vein and acute DVT involving the leftcommon femorall,profinda popliteal and posterior tibial vein 2013. Pt seen by hematology in 2014. W/S revealed continued clot in left leg  . Asthma     "as a child, real bad" (11/24/2012)  . History of bleeding ulcers ~ 1956    "in hospital for ~ 1 month" (11/24/2012)  . Recurrent UTI (urinary tract infection) 2013-now    "lots" (11/24/2012)  . DJD (degenerative joint disease)   . Arthritis     "joints" (11/24/2012)  . Anxiety   . Hyperaldosteronism ([place])   . CHF (congestive heart  failure) (HCC)     Diastolic dysfunction  . Vitamin B12 deficiency   . Spinal stenosis     Dr Christella Noa  . Dilated aortic root ([place])   . Mild aortic stenosis     echo 2012  . Atrial fibrillation [facility])     Assessment: 81yo male c/o fever and lethargy, found to have UTI (has been on Cipro). Pt now on cipro/flagyl for possible intra-abdominal infxn and continuing on Coumadin for Afib during admission. Coumadin alternating 2mg  and 3mg  daily PTA for Afib. Last dose was taken on 10/9 pta. INR supratherapeutic on admit at 3.55, now back in therapeutic range at 2.64 s/p 2mg  of warfarin last night.   Goal of Therapy:  INR 2-3   Plan:  Give coumadin 2mg  PO x 1  Monitor daily INR, CBC, s/s of bleed  Elicia Lamp, PharmD Clinical Pharmacist Pager [phone] 10/24/2014 8:56 AM

## 2014-10-24 NOTE — Progress Notes (Signed)
[facility] PROGRESS NOTE  Joshua Rodgers OYD:[id number] DOB: 1923/07/23 DOA: 10/22/2014 PCP: Kandice Hams, MD  Subjective: Today the patient is doing well. He is alert and oriented, responds to questions appropriately. Denies fever, chills, N/V/D, or abdominal pain.  Assessment/Plan: Principal Problem:  UTI with sepsis: Patient presented in sepsis with elevated lactic acid (2.31), leukocytosis (21.7), and tachypnea. UA positive for UTI. Likely secondary to chronic dwelling catheter and possible proctitis (see abdominal pain below). Patient was started on cipro/flagyl and IVF. Urine cultures show multiple species. Blood cultures show NGTD. Patient continues to improve- leukocytosis 15.3, lactate normal and afebrile. Continue antibiotics and monitor/follow clinical course.   Active Problems:  Abdominal pain: Present since 10/20/14 with 1 episode of diarrhea on same day. Likely secondary to proctitis as shown on CT. C. Diff cultures negative. No episodes of diarrhea during hospitalization. Continue cipro/flagly and monitor.   AKI: BUN/Cr 47/2.71 on admission, likely pre-renal azotemia from UTI/proctitis and sepsis. Continues to improve today to 39/1.66. IVF discontinued on 10/11 d/t CHF. Continue to encourage PO intake and monitor.   Acute encephalopathy: Resolved.Likely secondary to UTI/sepsis. Continue treating underlying cause.  Dementia: Chronic, managed at home with Memantine. No complications. Continue supportive care and monitor.   Essential HTN: Chronic, managed at home with metoprolol, amlodapine, and amiloride. Metoprolol continued, but others held at admission with to sepsis and high risk of hypotension. BP now starting to increase-resume Amlodipine today. Follow- will restart all medications on discharge. Continue PRN hydralazine and metoprolol, monitor.   Atrial Fibrillation: Chronic, patient is currently on Coumadin and metoprolol. (CHA2DS2-VASc Score 4). INR 2.64 today  (goal 2-3) and HR well controlled. Continue at home medications and monitor, pharmacy following for INR managment.   Chronic diastolic CHF: Chronic, managed at home with amiloride. 2-D echo on 06/28/14 showed EF 55-60 percent with grade 1 diastolic dysfunction. Amiloride held on admission d/t high risk of hypotension with sepsis, metoprolol continued. No evidence of fluid overload today. Continue current regimen and continue.   GERD: Managed at home with PPI, switched to pepcid IV on admission. C diff negative. No complications. Continue, monitor.   Mild Anemia: Hgb 12.1 on admission, improved today to 10.3. Likely secondary to acute illness/sepsis. No evidence of active GI bleed, LFTs unremarkable. No transfusion needed at this time, consider if hgb < 8. Continue to monitor.  Thrombocytopenia: Mild 128 on admission, continued to decrease to 91 today. Likely secondary to acute illness/sepsis. Continue to monitor.   Chronic B/L Lower ext weakness with foot drop: Secondary to severe spinal stenosis, bed to wheelchair bound at baseline  Code Status: Full Family Communication: Wife at bedside Disposition Plan: Remain inpatient   Consultants:  None  Procedures:  None   Antibiotics: Anti-infectives    Start     Dose/Rate Route Frequency Ordered Stop   10/22/14 0630  cefTRIAXone (ROCEPHIN) 1 g in dextrose 5 % 50 mL IVPB  Status:  Discontinued     1 g 100 mL/hr over 30 Minutes Intravenous Every 24 hours 10/22/14 0619 10/22/14 0622   10/22/14 0630  metroNIDAZOLE (FLAGYL) IVPB 500 mg     500 mg 100 mL/hr over 60 Minutes Intravenous Every 8 hours 10/22/14 0626     10/22/14 0630  ciprofloxacin (CIPRO) IVPB 400 mg     400 mg 200 mL/hr over 60 Minutes Intravenous Every 24 hours 10/22/14 0628     10/22/14 0500  cefTRIAXone (ROCEPHIN) 1 g in dextrose 5 % 50 mL IVPB  1 g 100 mL/hr over 30 Minutes Intravenous  Once 10/22/14 0445 10/22/14 0538        Objective: Filed Vitals:   10/24/14  0759  BP: 169/104  Pulse: 71  Temp:   Resp:     Intake/Output Summary (Last 24 hours) at 10/24/14 1013 Last data filed at 10/24/14 0603  Gross per 24 hour  Intake    790 ml  Output    725 ml  Net     65 ml   Filed Weights   10/22/14 0209 10/22/14 0821  Weight: 71.668 kg (158 lb) 70.6 kg (155 lb 10.3 oz)    Exam:   General: Frail appearing man, sitting up in bed eating breakfast, no acute distress  Cardiovascular: RRR, no m/r/g appreciated. No peripheral edema  Respiratory: CTA b/l. No wheeze or crackles   Abdomen: Soft, non-distended, non-tender.   Neuro: Alert, oriented. No focal neurological deficits noted. Bilateral leg weakness with foot drop   Data Reviewed: Basic Metabolic Panel:  Recent Labs Lab 10/22/14 0232 10/23/14 0658 10/24/14 0656  NA 145 143 144  K 5.1 4.0 3.6  CL 108 111 108  CO2 26 24 25   GLUCOSE 110* 80 100*  BUN 47* 45* 39*  CREATININE 2.71* 1.89* 1.66*  CALCIUM 10.3 9.1 9.2   Liver Function Tests:  Recent Labs Lab 10/22/14 0232 10/23/14 0658  AST 30 20  ALT 14* 12*  ALKPHOS 43 36*  BILITOT 0.3 0.4  PROT 6.0* 4.9*  ALBUMIN 3.1* 2.4*    Recent Labs Lab 10/22/14 0232  LIPASE 16*   No results for input(s): AMMONIA in the last 168 hours. CBC:  Recent Labs Lab 10/22/14 0232 10/23/14 0658 10/24/14 0656  WBC 21.7* 17.5* 15.3*  NEUTROABS 19.5*  --   --   HGB 12.1* 9.9* 10.3*  HCT 38.1* 31.5* 32.5*  MCV 96.5 96.9 97.9  PLT 128* 94* 91*   Cardiac Enzymes: No results for input(s): CKTOTAL, CKMB, CKMBINDEX, TROPONINI in the last 168 hours. BNP (last 3 results)  Recent Labs  06/28/14 0435 10/22/14 0657  BNP 100.6* 541.9*    ProBNP (last 3 results) No results for input(s): PROBNP in the last 8760 hours.  CBG:  Recent Labs Lab 10/22/14 0845 10/23/14 0743 10/24/14 0812  GLUCAP 88 76 92    Recent Results (from the past 240 hour(s))  Culture, blood (x 2)     Status: None (Preliminary result)   Collection  Time: 10/22/14  6:33 AM  Result Value Ref Range Status   Specimen Description BLOOD RIGHT ANTECUBITAL  Final   Special Requests BOTTLES DRAWN AEROBIC AND ANAEROBIC 5CC  Final   Culture NO GROWTH 1 DAY  Final   Report Status PENDING  Incomplete  Urine culture     Status: None   Collection Time: 10/22/14  6:50 AM  Result Value Ref Range Status   Specimen Description URINE, CATHETERIZED  Final   Special Requests NONE  Final   Culture MULTIPLE SPECIES PRESENT, SUGGEST RECOLLECTION  Final   Report Status 10/23/2014 FINAL  Final  Culture, blood (x 2)     Status: None (Preliminary result)   Collection Time: 10/22/14  9:00 AM  Result Value Ref Range Status   Specimen Description BLOOD RIGHT ANTECUBITAL  Final   Special Requests BOTTLES DRAWN AEROBIC AND ANAEROBIC 5CC  Final   Culture NO GROWTH 1 DAY  Final   Report Status PENDING  Incomplete  C difficile quick scan w PCR reflex  Status: None   Collection Time: 10/22/14 11:59 PM  Result Value Ref Range Status   C Diff antigen NEGATIVE NEGATIVE Final   C Diff toxin NEGATIVE NEGATIVE Final   C Diff interpretation Negative for toxigenic C. difficile  Final  MRSA PCR Screening     Status: None   Collection Time: 10/23/14  2:38 PM  Result Value Ref Range Status   MRSA by PCR NEGATIVE NEGATIVE Final    Comment:        The GeneXpert MRSA Assay (FDA approved for NASAL specimens only), is one component of a comprehensive MRSA colonization surveillance program. It is not intended to diagnose MRSA infection nor to guide or monitor treatment for MRSA infections.      Studies: No results found.  Scheduled Meds: . calcium citrate  2 tablet Oral BID  . cholecalciferol  1,000 Units Oral BID  . ciprofloxacin  400 mg Intravenous Q24H  . citalopram  10 mg Oral Daily  . famotidine (PEPCID) IV  20 mg Intravenous Q12H  . ferrous sulfate  325 mg Oral BID  . memantine  28 mg Oral Daily  . metoprolol tartrate  12.5 mg Oral BID  .  metronidazole  500 mg Intravenous Q8H  . sodium chloride  1,000 mL Intravenous Once  . sodium chloride  3 mL Intravenous Q12H  . vitamin B-12  1,000 mcg Oral Daily  . warfarin  2 mg Oral ONCE-1800  . Warfarin - Pharmacist Dosing Inpatient   Does not apply q1800   Continuous Infusions:   Principal Problem:   UTI (lower urinary tract infection) Active Problems:   Lumbar stenosis   Dementia   HTN (hypertension)   History of DVT (deep vein thrombosis)   Dilated aortic root (HCC)   Chronic diastolic CHF (congestive heart failure) (HCC)   Abdominal pain   Sepsis (HCC)   Diarrhea   Chronic indwelling Foley catheter   Chronic atrial fibrillation ([place])   Acute encephalopathy   Acute on chronic renal insufficiency ([place])   Joshua Rodgers, Student-PA  [facility] If 7PM-7AM, please contact night-coverage at [email or web address], password [facility] 10/24/2014, 10:13 AM  LOS: 2 days   Attending MD note  Patient was seen, examined,treatment plan was discussed with the PA-S.  I have personally reviewed the clinical findings, lab, imaging studies and management of this patient in detail. I agree with the documentation, as recorded by thePA-S.   Improving-no diarrhea. Afebrile. Leukocytosis decreasing. Blood cultures negative, urine cultures with multiple bacterial morphotypes-since already on broad-spectrum antibiotics since admission and responding-doubt need for re-collection. If clinical improvement continues, suspect home on 10/13. Discussed with spouse at [facility]

## 2014-10-24 NOTE — Evaluation (Signed)
Physical Therapy Evaluation Patient Details Name: Joshua Rodgers MRN: [id number] DOB: 1923/05/13 Today's Date: 10/24/2014   History of Present Illness  Patient is a 81 y/o male presents with fever, abdominal pain and  AMS; found to have sepsis 2/2 to UTI. CT abdomen- proctitis. PMH includes -hypertension, GERD, depression, anxiety, chronic bilateral leg weakness and chronic of Foley catheter secondary to lumbar stenosis, dementia, prostate cancer, (s/p of radiation therapy), DVT, asthma, GI bleeding 2/2 peptic ulcer, diastolic CHF, A-fib on Coumadin.   Clinical Impression  Patient presents close to functional baseline and requires total A for ADLs and all mobility. Pt is non ambulatory at baseline and spends most of day in w/c per wife. Wife uses hoyer lift to transfer pt to/from bed. Discussed positioning for BLEs esp need to wear PRAFOs at home. Recommend OOB using maxi move lift while in hospital. Tolerated sitting EOB with external support ranging from total A-Min guard assist with cues. Pt does not require further skilled therapy services as pt has 24/7 S at home and is functioning at baseline. Discharge from therapy.     Follow Up Recommendations No PT follow up;Supervision/Assistance - 24 hour    Equipment Recommendations  None recommended by PT    Recommendations for Other Services       Precautions / Restrictions Precautions Precautions: Fall Restrictions Weight Bearing Restrictions: No      Mobility  Bed Mobility Overal bed mobility: Needs Assistance Bed Mobility: Supine to Sit;Sit to Supine     Supine to sit: Total assist;+2 for physical assistance;HOB elevated Sit to supine: Total assist;+2 for physical assistance   General bed mobility comments: Assist with BLEs, scooting bottom to EOB using pad and to elevate trunk. Posterior trunk lean initially.  Transfers Overall transfer level:  (Uses lift at home)                  Ambulation/Gait                 Stairs            Wheelchair Mobility    Modified Rankin (Stroke Patients Only)       Balance Overall balance assessment: Needs assistance Sitting-balance support: Feet supported;Bilateral upper extremity supported Sitting balance-Leahy Scale: Zero Sitting balance - Comments: Sat EOB ~8-10 minutes with total A-Min gaurd assist for ~30 seconds at a time. Forward rocking to assist with upright posture and facilitate abdominals.  Postural control: Posterior lean                                   Pertinent Vitals/Pain Pain Assessment: Faces Faces Pain Scale: Hurts little more Pain Location: LEs during mobility Pain Descriptors / Indicators: Sore Pain Intervention(s): Monitored during session;Repositioned    Home Living Family/patient expects to be discharged to:: Private residence Living Arrangements: Spouse/significant other Available Help at Discharge: Family;Available 24 hours/day Type of Home: House Home Access: Ramped entrance     Home Layout: One level Home Equipment: Hospital bed;Wheelchair - [employer] - 2 wheels;Other (comment) (hoyer lift) Additional Comments: [employer] fast streamer    Prior Function Level of Independence: Needs assistance   Gait / Transfers Assistance Needed: pt non-ambulatory and uses a lift for OOB.    ADL's / Homemaking Assistance Needed: total (A)  Comments: pt has an aide that comes 2hrs per day.       Hand Dominance   Dominant Hand: Right  Extremity/Trunk Assessment   Upper Extremity Assessment: Defer to OT evaluation;Generalized weakness           Lower Extremity Assessment: Generalized weakness;RLE deficits/detail;LLE deficits/detail RLE Deficits / Details: Stiffness throughout RLE with foot drop. Decreased ankle AROM/PROM LLE Deficits / Details: Stiffness throughout RLE with foot drop. Decreased ankle AROM/PROM     Communication   Communication: No difficulties  Cognition Arousal/Alertness:  Awake/alert Behavior During Therapy: WFL for tasks assessed/performed Overall Cognitive Status: History of cognitive impairments - at baseline                      General Comments General comments (skin integrity, edema, etc.): Spouse present during session.    Exercises General Exercises - Lower Extremity Ankle Circles/Pumps: PROM;Both;10 reps;Supine Long Arc Quad: PROM;Both;10 reps;Seated Heel Slides: PROM;Both;10 reps;Supine      Assessment/Plan    PT Assessment Patent does not need any further PT services  PT Diagnosis Generalized weakness   PT Problem List    PT Treatment Interventions     PT Goals (Current goals can be found in the Care Plan section) Acute Rehab PT Goals Patient Stated Goal: to go home  PT Goal Formulation: All assessment and education complete, DC therapy Time For Goal Achievement: 11/07/14 Potential to Achieve Goals: Fair    Frequency     Barriers to discharge        Co-evaluation               End of Session   Activity Tolerance: Patient tolerated treatment well Patient left: in bed;with call bell/phone within reach;with family/visitor present Nurse Communication: Mobility status;Need for lift equipment         Time: 1457-1525 PT Time Calculation (min) (ACUTE ONLY): 28 min   Charges:   PT Evaluation $Initial PT Evaluation Tier I: 1 Procedure PT Treatments $Neuromuscular Re-education: 8-22 mins   PT G Codes:        Marshella Tello A Torrey Ballinas 10/24/2014, 3:53 PM  Wray Kearns, [place], DPT [phone]

## 2014-10-25 DIAGNOSIS — F0390 Unspecified dementia without behavioral disturbance: Secondary | ICD-10-CM

## 2014-10-25 LAB — CBC
HEMATOCRIT: 31.7 % — AB (ref 39.0–52.0)
Hemoglobin: 10 g/dL — ABNORMAL LOW (ref 13.0–17.0)
MCH: 30.7 pg (ref 26.0–34.0)
MCHC: 31.5 g/dL (ref 30.0–36.0)
MCV: 97.2 fL (ref 78.0–100.0)
Platelets: 91 10*3/uL — ABNORMAL LOW (ref 150–400)
RBC: 3.26 MIL/uL — ABNORMAL LOW (ref 4.22–5.81)
RDW: 15.1 % (ref 11.5–15.5)
WBC: 6.9 10*3/uL (ref 4.0–10.5)

## 2014-10-25 LAB — BASIC METABOLIC PANEL
Anion gap: 13 (ref 5–15)
BUN: 34 mg/dL — AB (ref 6–20)
CALCIUM: 9.2 mg/dL (ref 8.9–10.3)
CO2: 23 mmol/L (ref 22–32)
CREATININE: 1.47 mg/dL — AB (ref 0.61–1.24)
Chloride: 109 mmol/L (ref 101–111)
GFR calc Af Amer: 47 mL/min — ABNORMAL LOW (ref >60)
GFR calc non Af Amer: 40 mL/min — ABNORMAL LOW (ref >60)
GLUCOSE: 92 mg/dL (ref 65–99)
Potassium: 2.8 mmol/L — ABNORMAL LOW (ref 3.5–5.1)
Sodium: 145 mmol/L (ref 135–145)

## 2014-10-25 LAB — GI PATHOGEN PANEL BY PCR, STOOL
C difficile toxin A/B: NOT DETECTED
CRYPTOSPORIDIUM BY PCR: NOT DETECTED
Campylobacter by PCR: NOT DETECTED
E COLI (STEC): NOT DETECTED
E coli (ETEC) LT/ST: NOT DETECTED
E coli 0157 by PCR: NOT DETECTED
G LAMBLIA BY PCR: NOT DETECTED
NOROVIRUS G1/G2: NOT DETECTED
ROTAVIRUS A BY PCR: NOT DETECTED
Salmonella by PCR: NOT DETECTED
Shigella by PCR: NOT DETECTED

## 2014-10-25 LAB — PROTIME-INR
INR: 2.52 — AB (ref 0.00–1.49)
Prothrombin Time: 26.8 seconds — ABNORMAL HIGH (ref 11.6–15.2)

## 2014-10-25 LAB — POTASSIUM: Potassium: 3.1 mmol/L — ABNORMAL LOW (ref 3.5–5.1)

## 2014-10-25 LAB — GLUCOSE, CAPILLARY: Glucose-Capillary: 85 mg/dL (ref 65–99)

## 2014-10-25 MED ORDER — AMOXICILLIN-POT CLAVULANATE 500-125 MG PO TABS
1.0000 | ORAL_TABLET | Freq: Two times a day (BID) | ORAL | Status: DC
  Administered 2014-10-25: 500 mg via ORAL
  Filled 2014-10-25 (×2): qty 1

## 2014-10-25 MED ORDER — POTASSIUM CHLORIDE CRYS ER 20 MEQ PO TBCR
40.0000 meq | EXTENDED_RELEASE_TABLET | Freq: Once | ORAL | Status: AC
  Administered 2014-10-25: 40 meq via ORAL
  Filled 2014-10-25: qty 2

## 2014-10-25 MED ORDER — AMOXICILLIN-POT CLAVULANATE 500-125 MG PO TABS: 1.0000 | ORAL_TABLET | Freq: Two times a day (BID) | ORAL | Status: AC

## 2014-10-25 MED ORDER — POTASSIUM CHLORIDE 10 MEQ/100ML IV SOLN
10.0000 meq | INTRAVENOUS | Status: AC
  Administered 2014-10-25 (×3): 10 meq via INTRAVENOUS
  Filled 2014-10-25 (×3): qty 100

## 2014-10-25 MED ORDER — WARFARIN SODIUM 2 MG PO TABS
2.0000 mg | ORAL_TABLET | Freq: Once | ORAL | Status: DC
  Filled 2014-10-25: qty 1

## 2014-10-25 NOTE — Discharge Instructions (Signed)

## 2014-10-25 NOTE — Progress Notes (Signed)
ANTICOAGULATION/ANTIBIOTIC CONSULT NOTE  Pharmacy Consult for Coumadin / Cipro Indication: atrial fibrillation / intra-abdominal infxn  Allergies  Allergen Reactions  . Contrast Media [Iodinated Diagnostic Agents]     Pt blacked out  . Iohexol      Desc: sob, throat swelling (1988 gdc) pt requires full premeds and does well, JB 8/14/7     Patient Measurements: Height: 5\' 7"  (170.2 cm) Weight: 155 lb 10.3 oz (70.6 kg) IBW/kg (Calculated) : 66.1  Vital Signs: Temp: 97.7 F (36.5 C) (10/13 0547) Temp Source: Oral (10/13 0547) BP: 148/77 mmHg (10/13 0547) Pulse Rate: 79 (10/13 0547)  Labs:  Recent Labs  10/23/14 0658 10/24/14 0656 10/25/14 0630  HGB 9.9* 10.3* 10.0*  HCT 31.5* 32.5* 31.7*  PLT 94* 91* 91*  LABPROT 31.7* 27.8* 26.8*  INR 3.15* 2.64* 2.52*  CREATININE 1.89* 1.66* 1.47*    Estimated Creatinine Clearance: 31.9 mL/min (by C-G formula based on Cr of 1.47).   Medical History: Past Medical History  Diagnosis Date  . Generalized weakness   . Hypokalemia   . Renal disorder   . Hyperlipidemia   . Vitamin D deficiency   . Bell's palsy   . Lumbar stenosis   . Hypertension   . Depression   . Dementia   . Prostate cancer ([place]) ~ 2004    "had radiation tx" (11/24/2012)  . DVT (deep venous thrombosis) ([place]) 11/24/2012    Acute DVT in the rightcommon femoral vein and acute DVT involving the leftcommon femorall,profinda popliteal and posterior tibial vein 2013. Pt seen by hematology in 2014. W/S revealed continued clot in left leg  . Asthma     "as a child, real bad" (11/24/2012)  . History of bleeding ulcers ~ 1956    "in hospital for ~ 1 month" (11/24/2012)  . Recurrent UTI (urinary tract infection) 2013-now    "lots" (11/24/2012)  . DJD (degenerative joint disease)   . Arthritis     "joints" (11/24/2012)  . Anxiety   . Hyperaldosteronism ([place])   . CHF (congestive heart failure) (HCC)     Diastolic dysfunction  . Vitamin B12 deficiency   . Spinal  stenosis     Dr Christella Noa  . Dilated aortic root ([place])   . Mild aortic stenosis     echo 2012  . Atrial fibrillation [facility])     Assessment: 81yo male c/o fever and lethargy, found to have UTI (has been on Cipro). Pt now on cipro/flagyl for possible intra-abdominal infxn and continuing on Coumadin for Afib during admission.   Coumadin alternating 2mg  and 3mg  daily PTA for Afib. Last dose was taken on 10/9 pta. INR supratherapeutic on admit at 3.55, now back in therapeutic range at 2.64>>2.52 s/p 2 days of warfarin 2mg  doses. Hgb low stable 10, plts stable 91  Cipro/Flagyl D#4 for possible intra-abdominal infection. Afebrile, WBC elevated, down to wnl, LA trend down. C diff negative. SCr down to 1.47 (peaked at 2.71), CrCl ~68ml/min, UOP good 0.7 ml/kg/h.  Blood cx 10/10 > ngtd Urine cx 10/10 > ngf  Cipro 10/10 >> Flagyl 10/10 >>   Goal of Therapy:  INR 2-3   Plan:  Give coumadin 2mg  PO x 1  Monitor daily INR, CBC, s/s of bleed  Cipro 400mg  IV Q24 - borderline to increase dose Flagyl 500mg  IV Q8 per MD Monitor clinical picture, renal function F/U C&S, abx deescalation / LOT - de-escalate today?  Elicia Lamp, PharmD Clinical Pharmacist Pager [phone] 10/25/2014 8:44 AM

## 2014-10-25 NOTE — Progress Notes (Signed)
Leitha Bleak to be D/C'd Home per MD order.  Discussed with the patient's family and all questions fully answered.  VSS, Skin clean, dry and intact without evidence of skin break down, no evidence of skin tears noted. IV catheter discontinued intact. Site without signs and symptoms of complications. Dressing and pressure applied.  An After Visit Summary was printed and given to the patient. Patient received prescription.  D/c education completed with patient/family including follow up instructions, medication list, d/c activities limitations if indicated, with other d/c instructions as indicated by MD - patient's family able to verbalize understanding, all questions fully answered.   Patient's family was instructed to return to ED, call 911, or call MD for any changes in condition.   Patient escorted via [place], and D/C home via private auto.  Elon Jester 10/25/2014 5:06 PM

## 2014-10-25 NOTE — Discharge Summary (Addendum)
PATIENT DETAILS Name: Joshua Rodgers Age: 81 y.o. Sex: male Date of Birth: 04-Jan-1924 MRN: [id number]. Admitting Physician: Ivor Costa, MD LGX:QJJHER,DEYCXK D, MD  Admit Date: 10/22/2014 Discharge date: 10/25/2014  Recommendations for Outpatient Follow-up:  1. Please check Lytes at next visit (mild hypokalemia) 2. Please check CBC at next visit-need to ensure Platelet and Hb are improving. 3. Please follow blood/urine cultures till final 4. GI pathogen panel pending at the time of discharge,please follow.  PRIMARY DISCHARGE DIAGNOSIS:  Principal Problem:   UTI (lower urinary tract infection) Active Problems:   Lumbar stenosis   Dementia   HTN (hypertension)   History of DVT (deep vein thrombosis)   Dilated aortic root (HCC)   Chronic diastolic CHF (congestive heart failure) (HCC)   Abdominal pain   Sepsis (HCC)   Diarrhea   Chronic indwelling Foley catheter   Chronic atrial fibrillation (HCC)   Acute encephalopathy   Acute on chronic renal insufficiency (HCC)      PAST MEDICAL HISTORY: Past Medical History  Diagnosis Date  . Generalized weakness   . Hypokalemia   . Renal disorder   . Hyperlipidemia   . Vitamin D deficiency   . Bell's palsy   . Lumbar stenosis   . Hypertension   . Depression   . Dementia   . Prostate cancer ([place]) ~ 2004    "had radiation tx" (11/24/2012)  . DVT (deep venous thrombosis) ([place]) 11/24/2012    Acute DVT in the rightcommon femoral vein and acute DVT involving the leftcommon femorall,profinda popliteal and posterior tibial vein 2013. Pt seen by hematology in 2014. W/S revealed continued clot in left leg  . Asthma     "as a child, real bad" (11/24/2012)  . History of bleeding ulcers ~ 1956    "in hospital for ~ 1 month" (11/24/2012)  . Recurrent UTI (urinary tract infection) 2013-now    "lots" (11/24/2012)  . DJD (degenerative joint disease)   . Arthritis     "joints" (11/24/2012)  . Anxiety   . Hyperaldosteronism ([place])   .  CHF (congestive heart failure) (HCC)     Diastolic dysfunction  . Vitamin B12 deficiency   . Spinal stenosis     Dr Christella Noa  . Dilated aortic root ([place])   . Mild aortic stenosis     echo 2012  . Atrial fibrillation ([place])     DISCHARGE MEDICATIONS: Current Discharge Medication List    START taking these medications   Details  amoxicillin-clavulanate (AUGMENTIN) 500-125 MG tablet Take 1 tablet (500 mg total) by mouth every 12 (twelve) hours. Qty: 6 tablet, Refills: 0      CONTINUE these medications which have NOT CHANGED   Details  acetaminophen (TYLENOL) 500 MG tablet Take 1,000 mg by mouth every 6 (six) hours as needed for moderate pain, fever or headache.    ALPRAZolam (XANAX) 0.5 MG tablet Take 0.5 mg by mouth 3 (three) times daily as needed for anxiety. For anxiety.    aMILoride (MIDAMOR) 5 MG tablet Take 10 mg by mouth 2 (two) times daily.     amLODipine (NORVASC) 10 MG tablet Take 10 mg by mouth daily.    CALCIUM CITRATE PO Take 400 mg by mouth 2 (two) times daily.    cholecalciferol (VITAMIN D) 1000 UNITS tablet Take 1,000 Units by mouth 2 (two) times daily.     citalopram (CELEXA) 10 MG tablet Take 10 mg by mouth daily.    Cyanocobalamin (VITAMIN B 12 PO) Take 1,000 mg  by mouth daily.    docusate sodium 100 MG CAPS Take 100 mg by mouth 2 (two) times daily. Qty: 10 capsule, Refills: 0    ferrous sulfate 324 (65 FE) MG TBEC Take 1 tablet by mouth 2 (two) times daily.     Glucosamine-Chondroit-Vit C-Mn (GLUCOSAMINE CHONDR 1500 COMPLX) CAPS Take 1 capsule by mouth daily.    memantine (NAMENDA XR) 28 MG CP24 24 hr capsule Take 28 mg by mouth daily.    metoprolol tartrate (LOPRESSOR) 25 MG tablet Take 0.5 tablets (12.5 mg total) by mouth 2 (two) times daily. Qty: 60 tablet, Refills: 2    pantoprazole (PROTONIX) 40 MG tablet Take 40 mg by mouth daily.    potassium chloride SA (K-DUR,KLOR-CON) 20 MEQ tablet Take 20 mEq by mouth 2 (two) times daily.    warfarin  (COUMADIN) 2 MG tablet Take 2-3 mg by mouth daily at 6 PM.       STOP taking these medications     ciprofloxacin (CIPRO) 500 MG tablet      cefpodoxime (VANTIN) 200 MG tablet         ALLERGIES:   Allergies  Allergen Reactions  . Contrast Media [Iodinated Diagnostic Agents]     Pt blacked out  . Iohexol      Desc: sob, throat swelling (1988 gdc) pt requires full premeds and does well, JB 8/14/7     BRIEF HPI:  See H&P, Labs, Consult and Test reports for all details in brief, patient is a 81 y.o. male with PMH of hypertension, GERD, depression, anxiety, chronic bilateral leg weakness and chronic of Foley catheter secondary to lumbar stenosis, dementia, prostate cancer, (s/p of radiation therapy), DVT, asthma, GI bleeding 2/2 peptic ulcer, diastolic congestive heart failure (EF 76-19%, grade 1 diastolic dysfunction), atrial fibrillation on Coumadin, who presented with a fever, altered mental status   CONSULTATIONS:   None  PERTINENT RADIOLOGIC STUDIES: Ct Abdomen Pelvis Wo Contrast  10/22/2014  CLINICAL DATA:  Left lower quadrant tenderness. History of prostate cancer. No IV contrast material due to allergy. Altered mental status. EXAM: CT ABDOMEN AND PELVIS WITHOUT CONTRAST TECHNIQUE: Multidetector CT imaging of the abdomen and pelvis was performed following the standard protocol without IV contrast. COMPARISON:  None. FINDINGS: Examination technically limited due to motion artifact. Minimal bilateral pleural effusions. Coronary artery calcifications. Bilateral adrenal gland nodules measuring 13 mm on the right eighth and 3.1 cm on the left. Hounsfield unit measurements are consistent with fat containing adenomas. The unenhanced appearance of the liver, spleen, pancreas, kidneys, inferior vena cava, and retroperitoneal lymph nodes is unremarkable. Calcification of abdominal aorta without aneurysm. Small umbilical hernia containing fat. Stomach is mildly distended without wall  thickening. Contrast material flows through to the colon without evidence of small bowel obstruction. No small bowel wall thickening or distention. Stool-filled colon without distention. No free air or free fluid in the abdomen. Pelvis: Foley catheter in the bladder accounts for gas in the bladder. No bladder wall thickening. Small bladder diverticula. Prostate gland is mildly enlarged at 3.6 x 4.5 cm diameter. Prostate gland is slightly asymmetric towards the left posteriorly. This may correlate to patient's history of prostate cancer. Surgical clips are present in the pelvis. Pelvic and groin lymph nodes are present without pathologic enlargement. No free or loculated pelvic fluid collections. No pelvic mass or lymphadenopathy. Rectosigmoid colon is mostly decompressed. Suggestion of wall thickening in the rectum may indicate proctitis. Small lipoma in the left iliacus muscle. Diffuse bone demineralization. Degenerative changes  in the spine. No definite evidence of any bone metastasis. IMPRESSION: Bilateral adrenal gland nodules consistent with adenomas. Thickening of the wall of the rectum may indicate proctitis. Diffuse bone demineralization. No definite evidence of bone metastasis. Mild asymmetric enlargement of the prostate gland. Electronically Signed   By: Lucienne Capers M.D.   On: 10/22/2014 05:13   Dg Chest Port 1 View  10/22/2014  CLINICAL DATA:  81 year old male with altered mental status. Initial encounter. EXAM: PORTABLE CHEST 1 VIEW COMPARISON:  06/28/2014. FINDINGS: Portable AP semi upright view at 0248 hours. Low but mildly improved lung volumes compared to prior. Stable cardiac size and mediastinal contours. Allowing for portable technique, the lungs are clear. No pneumothorax or pleural effusion identified. IMPRESSION: No acute cardiopulmonary abnormality. Electronically Signed   By: Genevie Ann M.D.   On: 10/22/2014 02:57     PERTINENT LAB RESULTS: CBC:  Recent Labs  10/24/14 0656  10/25/14 0630  WBC 15.3* 6.9  HGB 10.3* 10.0*  HCT 32.5* 31.7*  PLT 91* 91*   CMET CMP     Component Value Date/Time   NA 145 10/25/2014 0630   K 3.1* 10/25/2014 1430   CL 109 10/25/2014 0630   CO2 23 10/25/2014 0630   GLUCOSE 92 10/25/2014 0630   BUN 34* 10/25/2014 0630   CREATININE 1.47* 10/25/2014 0630   CREATININE 1.49* 12/16/2011 0740   CALCIUM 9.2 10/25/2014 0630   PROT 4.9* 10/23/2014 0658   ALBUMIN 2.4* 10/23/2014 0658   AST 20 10/23/2014 0658   ALT 12* 10/23/2014 0658   ALKPHOS 36* 10/23/2014 0658   BILITOT 0.4 10/23/2014 0658   GFRNONAA 40* 10/25/2014 0630   GFRNONAA 42* 12/16/2011 0740   GFRAA 47* 10/25/2014 0630   GFRAA 49* 12/16/2011 0740    GFR Estimated Creatinine Clearance: 31.9 mL/min (by C-G formula based on Cr of 1.47). No results for input(s): LIPASE, AMYLASE in the last 72 hours. No results for input(s): CKTOTAL, CKMB, CKMBINDEX, TROPONINI in the last 72 hours. Invalid input(s): POCBNP No results for input(s): DDIMER in the last 72 hours. No results for input(s): HGBA1C in the last 72 hours. No results for input(s): CHOL, HDL, LDLCALC, TRIG, CHOLHDL, LDLDIRECT in the last 72 hours. No results for input(s): TSH, T4TOTAL, T3FREE, THYROIDAB in the last 72 hours.  Invalid input(s): FREET3 No results for input(s): VITAMINB12, FOLATE, FERRITIN, TIBC, IRON, RETICCTPCT in the last 72 hours. Coags:  Recent Labs  10/24/14 0656 10/25/14 0630  INR 2.64* 2.52*   Microbiology: Recent Results (from the past 240 hour(s))  Culture, blood (x 2)     Status: None (Preliminary result)   Collection Time: 10/22/14  6:33 AM  Result Value Ref Range Status   Specimen Description BLOOD RIGHT ANTECUBITAL  Final   Special Requests BOTTLES DRAWN AEROBIC AND ANAEROBIC 5CC  Final   Culture NO GROWTH 3 DAYS  Final   Report Status PENDING  Incomplete  Urine culture     Status: None   Collection Time: 10/22/14  6:50 AM  Result Value Ref Range Status   Specimen  Description URINE, CATHETERIZED  Final   Special Requests NONE  Final   Culture MULTIPLE SPECIES PRESENT, SUGGEST RECOLLECTION  Final   Report Status 10/23/2014 FINAL  Final  Culture, blood (x 2)     Status: None (Preliminary result)   Collection Time: 10/22/14  9:00 AM  Result Value Ref Range Status   Specimen Description BLOOD RIGHT ANTECUBITAL  Final   Special Requests BOTTLES DRAWN AEROBIC  AND ANAEROBIC 5CC  Final   Culture NO GROWTH 3 DAYS  Final   Report Status PENDING  Incomplete  C difficile quick scan w PCR reflex     Status: None   Collection Time: 10/22/14 11:59 PM  Result Value Ref Range Status   C Diff antigen NEGATIVE NEGATIVE Final   C Diff toxin NEGATIVE NEGATIVE Final   C Diff interpretation Negative for toxigenic C. difficile  Final  MRSA PCR Screening     Status: None   Collection Time: 10/23/14  2:38 PM  Result Value Ref Range Status   MRSA by PCR NEGATIVE NEGATIVE Final    Comment:        The GeneXpert MRSA Assay (FDA approved for NASAL specimens only), is one component of a comprehensive MRSA colonization surveillance program. It is not intended to diagnose MRSA infection nor to guide or monitor treatment for MRSA infections.      BRIEF HOSPITAL COURSE:  UTI with sepsis: Patient presented in sepsis with elevated lactic acid (2.31), leukocytosis (21.7), and tachypnea. UA positive for UTI. Likely secondary to chronic dwelling catheter and possible proctitis (see abdominal pain below). Patient was started on cipro/flagyl and IVF. Urine cultures show multiple species. Blood cultures show NGTD. Patient continues to improve- leukocytosis has resolved, no fever since admission. Will transition to Augmentin on discharge, urine cultures was not repeated as patient was already on IV Cipro-cultures would likely be sterile.  Active Problems:  Abdominal pain: Present since 10/20/14 with 1 episode of diarrhea on same day. Likely secondary to proctitis as shown on CT. C.  Diff cultures negative. No episodes of diarrhea during hospitalization. GI pathogen panel pending at the time of discharge,please follow.  AKI: BUN/Cr 47/2.71 on admission, likely pre-renal azotemia from UTI/proctitis and sepsis. Continues to improve today to 39/1.66. IVF discontinued on 10/11 d/t CHF. Continue to encourage PO intake and monitor.   Acute encephalopathy: Resolved.Likely secondary to UTI/sepsis.  Dementia: Chronic, managed at home with Memantine. Mental status at baseline.  Essential HTN: Chronic, BP meds initially held-once stable-metoprolol started. Will resume all antihypertensive medications on discharge.   Atrial Fibrillation: Chronic, patient is currently on Coumadin and metoprolol. (CHA2DS2-VASc Score 4). INR 2.64 today (goal 2-3) and HR well controlled. Continue coumadin at prior dosing on discharge.  Chronic diastolic CHF: Chronic, managed at home with amiloride. 2-D echo on 06/28/14 showed EF 55-60 percent with grade 1 diastolic dysfunction. Amiloride held on admission d/t high risk of hypotension with sepsis-will resume on discharge, metoprolol continued. No evidence of fluid overload.  GERD: continue PPI.   Mild Anemia: Hgb 12.1 on admission, improved today to 10.3. Likely secondary to acute illness/sepsis. No evidence of active GI bleed, LFTs unremarkable. No transfusion needed at this time, consider if hgb < 8. Continue to monitor.  Thrombocytopenia: Mild 128 on admission, continued to decrease to 91 today. Likely secondary to acute illness/sepsis. Continue to monitor closely as outpatient-suggest repeat CBC at next visit with PCP  Chronic B/L Lower ext weakness with foot drop: Secondary to severe spinal stenosis, bed to wheelchair bound at baseline  Mild Dysphagia:likely worsened by chronic illness, per spouse on mech soft diet at home. SLP eval completed-recommendations are to continue with Dys 3 diet. Spouse aware of aspiration risk  Hypokalemia:continue K and  Amiloride on discharge. Recheck lytes at next visit.  TODAY-DAY OF DISCHARGE:  Subjective:   Dereke Neumann today has no headache,no chest abdominal pain,no new weakness tingling or numbness, feels much better wants to  go home today.   Objective:   Blood pressure 148/77, pulse 79, temperature 97.7 F (36.5 C), temperature source Oral, resp. rate 18, height 5\' 7"  (1.702 m), weight 70.6 kg (155 lb 10.3 oz), SpO2 100 %.  Intake/Output Summary (Last 24 hours) at 10/25/14 1540 Last data filed at 10/25/14 1222  Gross per 24 hour  Intake   1320 ml  Output    800 ml  Net    520 ml   Filed Weights   10/22/14 0209 10/22/14 0821  Weight: 71.668 kg (158 lb) 70.6 kg (155 lb 10.3 oz)    Exam Awake Alert, Oriented *3, No new F.N deficits, Normal affect [place].AT,PERRAL Supple Neck,No JVD, No cervical lymphadenopathy appriciated.  Symmetrical Chest wall movement, Good air movement bilaterally, CTAB RRR,No Gallops,Rubs or new Murmurs, No Parasternal Heave +ve B.Sounds, Abd Soft, Non tender, No organomegaly appriciated, No rebound -guarding or rigidity. No Cyanosis, Clubbing or edema, No new Rash or bruise  DISCHARGE CONDITION: Stable  DISPOSITION: Home with home health services  DISCHARGE INSTRUCTIONS:    Activity:  As tolerated with Full fall precautions use walker/cane & assistance as needed  Get Medicines reviewed and adjusted: Please take all your medications with you for your next visit with your Primary MD  Please request your Primary MD to go over all hospital tests and procedure/radiological results at the follow up, please ask your Primary MD to get all Hospital records sent to his/her office.  If you experience worsening of your admission symptoms, develop shortness of breath, life threatening emergency, suicidal or homicidal thoughts you must seek medical attention immediately by calling 911 or calling your MD immediately  if symptoms less severe.  You must read complete  instructions/literature along with all the possible adverse reactions/side effects for all the Medicines you take and that have been prescribed to you. Take any new Medicines after you have completely understood and accpet all the possible adverse reactions/side effects.   Do not drive when taking Pain medications.   Do not take more than prescribed Pain, Sleep and Anxiety Medications  Special Instructions: If you have smoked or chewed Tobacco  in the last 2 yrs please stop smoking, stop any regular Alcohol  and or any Recreational drug use.  Wear Seat belts while driving.  Please note  You were cared for by a hospitalist during your hospital stay. Once you are discharged, your primary care physician will handle any further medical issues. Please note that NO REFILLS for any discharge medications will be authorized once you are discharged, as it is imperative that you return to your primary care physician (or establish a relationship with a primary care physician if you do not have one) for your aftercare needs so that they can reassess your need for medications and monitor your lab values.   Diet recommendation: Dysphagia 3 diet.  Discharge Instructions    Call MD for:  difficulty breathing, headache or visual disturbances    Complete by:  As directed      Call MD for:  persistant nausea and vomiting    Complete by:  As directed      Diet - low sodium heart healthy    Complete by:  As directed      Increase activity slowly    Complete by:  As directed            Follow-up Information    Follow up with [facility].   Why:  resume services patient had  prior to admission   Contact information:   [address] [place] [address] [phone]       Follow up with Kandice Hams, MD. Schedule an appointment as soon as possible for a visit in 1 week.   Specialty:  Internal Medicine   Why:  please ask your Primary MD to check your electrolytes-to make  sure your potassium is back to normal.   Contact information:   [address] [place]  [address] [phone]      Total Time spent on discharge equals  45 minutes.  SignedOren Binet 10/25/2014 3:40 PM

## 2014-10-27 LAB — CULTURE, BLOOD (ROUTINE X 2)
CULTURE: NO GROWTH
Culture: NO GROWTH

## 2015-01-19 ENCOUNTER — Encounter (HOSPITAL_COMMUNITY): Payer: Self-pay | Admitting: Emergency Medicine

## 2015-01-19 ENCOUNTER — Emergency Department (HOSPITAL_COMMUNITY): Payer: Medicare Other

## 2015-01-19 ENCOUNTER — Inpatient Hospital Stay (HOSPITAL_COMMUNITY)
Admission: EM | Admit: 2015-01-19 | Discharge: 2015-02-13 | DRG: 698 | Disposition: E | Payer: Medicare Other | Attending: Internal Medicine | Admitting: Internal Medicine

## 2015-01-19 DIAGNOSIS — Z7401 Bed confinement status: Secondary | ICD-10-CM

## 2015-01-19 DIAGNOSIS — J9601 Acute respiratory failure with hypoxia: Secondary | ICD-10-CM | POA: Diagnosis present

## 2015-01-19 DIAGNOSIS — N390 Urinary tract infection, site not specified: Secondary | ICD-10-CM | POA: Diagnosis not present

## 2015-01-19 DIAGNOSIS — Z86718 Personal history of other venous thrombosis and embolism: Secondary | ICD-10-CM

## 2015-01-19 DIAGNOSIS — T45511A Poisoning by anticoagulants, accidental (unintentional), initial encounter: Secondary | ICD-10-CM

## 2015-01-19 DIAGNOSIS — N312 Flaccid neuropathic bladder, not elsewhere classified: Secondary | ICD-10-CM | POA: Diagnosis not present

## 2015-01-19 DIAGNOSIS — T83511A Infection and inflammatory reaction due to indwelling urethral catheter, initial encounter: Secondary | ICD-10-CM | POA: Diagnosis present

## 2015-01-19 DIAGNOSIS — Z66 Do not resuscitate: Secondary | ICD-10-CM | POA: Diagnosis present

## 2015-01-19 DIAGNOSIS — Z96652 Presence of left artificial knee joint: Secondary | ICD-10-CM | POA: Diagnosis present

## 2015-01-19 DIAGNOSIS — E860 Dehydration: Secondary | ICD-10-CM | POA: Diagnosis present

## 2015-01-19 DIAGNOSIS — Z8546 Personal history of malignant neoplasm of prostate: Secondary | ICD-10-CM

## 2015-01-19 DIAGNOSIS — G9340 Encephalopathy, unspecified: Secondary | ICD-10-CM

## 2015-01-19 DIAGNOSIS — F0390 Unspecified dementia without behavioral disturbance: Secondary | ICD-10-CM | POA: Diagnosis not present

## 2015-01-19 DIAGNOSIS — Z7901 Long term (current) use of anticoagulants: Secondary | ICD-10-CM | POA: Diagnosis not present

## 2015-01-19 DIAGNOSIS — D6832 Hemorrhagic disorder due to extrinsic circulating anticoagulants: Secondary | ICD-10-CM | POA: Diagnosis present

## 2015-01-19 DIAGNOSIS — Z923 Personal history of irradiation: Secondary | ICD-10-CM | POA: Diagnosis not present

## 2015-01-19 DIAGNOSIS — I482 Chronic atrial fibrillation, unspecified: Secondary | ICD-10-CM | POA: Diagnosis present

## 2015-01-19 DIAGNOSIS — N189 Chronic kidney disease, unspecified: Secondary | ICD-10-CM | POA: Diagnosis present

## 2015-01-19 DIAGNOSIS — Z960 Presence of urogenital implants: Secondary | ICD-10-CM

## 2015-01-19 DIAGNOSIS — I10 Essential (primary) hypertension: Secondary | ICD-10-CM

## 2015-01-19 DIAGNOSIS — N179 Acute kidney failure, unspecified: Secondary | ICD-10-CM | POA: Diagnosis present

## 2015-01-19 DIAGNOSIS — E870 Hyperosmolality and hypernatremia: Secondary | ICD-10-CM | POA: Diagnosis present

## 2015-01-19 DIAGNOSIS — E269 Hyperaldosteronism, unspecified: Secondary | ICD-10-CM | POA: Diagnosis present

## 2015-01-19 DIAGNOSIS — Z9289 Personal history of other medical treatment: Secondary | ICD-10-CM

## 2015-01-19 DIAGNOSIS — T45515A Adverse effect of anticoagulants, initial encounter: Secondary | ICD-10-CM | POA: Diagnosis present

## 2015-01-19 DIAGNOSIS — A419 Sepsis, unspecified organism: Secondary | ICD-10-CM | POA: Diagnosis present

## 2015-01-19 DIAGNOSIS — R7989 Other specified abnormal findings of blood chemistry: Secondary | ICD-10-CM | POA: Diagnosis present

## 2015-01-19 DIAGNOSIS — Z87891 Personal history of nicotine dependence: Secondary | ICD-10-CM | POA: Diagnosis not present

## 2015-01-19 DIAGNOSIS — R6521 Severe sepsis with septic shock: Secondary | ICD-10-CM | POA: Diagnosis present

## 2015-01-19 DIAGNOSIS — Z978 Presence of other specified devices: Secondary | ICD-10-CM

## 2015-01-19 DIAGNOSIS — I5032 Chronic diastolic (congestive) heart failure: Secondary | ICD-10-CM | POA: Diagnosis present

## 2015-01-19 DIAGNOSIS — E559 Vitamin D deficiency, unspecified: Secondary | ICD-10-CM | POA: Diagnosis present

## 2015-01-19 DIAGNOSIS — I130 Hypertensive heart and chronic kidney disease with heart failure and stage 1 through stage 4 chronic kidney disease, or unspecified chronic kidney disease: Secondary | ICD-10-CM | POA: Diagnosis present

## 2015-01-19 DIAGNOSIS — E785 Hyperlipidemia, unspecified: Secondary | ICD-10-CM | POA: Diagnosis present

## 2015-01-19 DIAGNOSIS — R4182 Altered mental status, unspecified: Secondary | ICD-10-CM | POA: Diagnosis present

## 2015-01-19 DIAGNOSIS — E872 Acidosis: Secondary | ICD-10-CM | POA: Diagnosis present

## 2015-01-19 DIAGNOSIS — D689 Coagulation defect, unspecified: Secondary | ICD-10-CM

## 2015-01-19 LAB — URINE MICROSCOPIC-ADD ON

## 2015-01-19 LAB — CBC WITH DIFFERENTIAL/PLATELET
Basophils Absolute: 0 10*3/uL (ref 0.0–0.1)
Basophils Relative: 0 %
EOS PCT: 1 %
Eosinophils Absolute: 0.1 10*3/uL (ref 0.0–0.7)
HEMATOCRIT: 34 % — AB (ref 39.0–52.0)
Hemoglobin: 10.5 g/dL — ABNORMAL LOW (ref 13.0–17.0)
LYMPHS ABS: 0.6 10*3/uL — AB (ref 0.7–4.0)
Lymphocytes Relative: 5 %
MCH: 30.3 pg (ref 26.0–34.0)
MCHC: 30.9 g/dL (ref 30.0–36.0)
MCV: 98.3 fL (ref 78.0–100.0)
MONO ABS: 0.7 10*3/uL (ref 0.1–1.0)
Monocytes Relative: 6 %
NEUTROS ABS: 10 10*3/uL — AB (ref 1.7–7.7)
NEUTROS PCT: 88 %
Platelets: 155 10*3/uL (ref 150–400)
RBC: 3.46 MIL/uL — AB (ref 4.22–5.81)
RDW: 15.4 % (ref 11.5–15.5)
WBC: 11.4 10*3/uL — AB (ref 4.0–10.5)

## 2015-01-19 LAB — I-STAT ARTERIAL BLOOD GAS, ED
ACID-BASE DEFICIT: 9 mmol/L — AB (ref 0.0–2.0)
Bicarbonate: 14.9 mEq/L — ABNORMAL LOW (ref 20.0–24.0)
O2 SAT: 100 %
PCO2 ART: 25.8 mmHg — AB (ref 35.0–45.0)
TCO2: 16 mmol/L (ref 0–100)
pH, Arterial: 7.37 (ref 7.350–7.450)
pO2, Arterial: 410 mmHg — ABNORMAL HIGH (ref 80.0–100.0)

## 2015-01-19 LAB — PROTIME-INR
INR: 9.04 (ref 0.00–1.49)
Prothrombin Time: 70.1 seconds — ABNORMAL HIGH (ref 11.6–15.2)

## 2015-01-19 LAB — COMPREHENSIVE METABOLIC PANEL
ALT: <5 U/L — ABNORMAL LOW (ref 17–63)
ANION GAP: 24 — AB (ref 5–15)
AST: 50 U/L — ABNORMAL HIGH (ref 15–41)
Albumin: 2.8 g/dL — ABNORMAL LOW (ref 3.5–5.0)
Alkaline Phosphatase: 56 U/L (ref 38–126)
BUN: 41 mg/dL — ABNORMAL HIGH (ref 6–20)
CHLORIDE: 112 mmol/L — AB (ref 101–111)
CO2: 11 mmol/L — ABNORMAL LOW (ref 22–32)
CREATININE: 2.97 mg/dL — AB (ref 0.61–1.24)
Calcium: 8.9 mg/dL (ref 8.9–10.3)
GFR, EST AFRICAN AMERICAN: 20 mL/min — AB (ref >60)
GFR, EST NON AFRICAN AMERICAN: 17 mL/min — AB (ref >60)
Glucose, Bld: 78 mg/dL (ref 65–99)
POTASSIUM: 4.3 mmol/L (ref 3.5–5.1)
Sodium: 147 mmol/L — ABNORMAL HIGH (ref 135–145)
Total Bilirubin: <0.1 mg/dL — ABNORMAL LOW (ref 0.3–1.2)
Total Protein: 5.4 g/dL — ABNORMAL LOW (ref 6.5–8.1)

## 2015-01-19 LAB — I-STAT CG4 LACTIC ACID, ED
LACTIC ACID, VENOUS: 13.48 mmol/L — AB (ref 0.5–2.0)
LACTIC ACID, VENOUS: 8.56 mmol/L — AB (ref 0.5–2.0)

## 2015-01-19 LAB — URINALYSIS, ROUTINE W REFLEX MICROSCOPIC
BILIRUBIN URINE: NEGATIVE
Glucose, UA: NEGATIVE mg/dL
Ketones, ur: NEGATIVE mg/dL
Nitrite: NEGATIVE
Protein, ur: 30 mg/dL — AB
SPECIFIC GRAVITY, URINE: 1.011 (ref 1.005–1.030)
pH: 6 (ref 5.0–8.0)

## 2015-01-19 MED ORDER — ACETAMINOPHEN 650 MG RE SUPP
650.0000 mg | Freq: Four times a day (QID) | RECTAL | Status: DC | PRN
Start: 2015-01-19 — End: 2015-01-20

## 2015-01-19 MED ORDER — SODIUM CHLORIDE 0.9 % IV BOLUS (SEPSIS)
1000.0000 mL | Freq: Once | INTRAVENOUS | Status: AC
  Administered 2015-01-19: 1000 mL via INTRAVENOUS

## 2015-01-19 MED ORDER — ACETAMINOPHEN 650 MG RE SUPP
650.0000 mg | Freq: Once | RECTAL | Status: AC
  Administered 2015-01-19: 650 mg via RECTAL
  Filled 2015-01-19: qty 1

## 2015-01-19 MED ORDER — PIPERACILLIN-TAZOBACTAM IN DEX 2-0.25 GM/50ML IV SOLN
2.2500 g | Freq: Three times a day (TID) | INTRAVENOUS | Status: DC
  Administered 2015-01-19 – 2015-01-20 (×2): 2.25 g via INTRAVENOUS
  Filled 2015-01-19 (×5): qty 50

## 2015-01-19 MED ORDER — ACETAMINOPHEN 325 MG PO TABS: 650.0000 mg | ORAL_TABLET | Freq: Four times a day (QID) | ORAL | Status: DC | PRN

## 2015-01-19 MED ORDER — ONDANSETRON HCL 4 MG PO TABS: 4.0000 mg | ORAL_TABLET | Freq: Four times a day (QID) | ORAL | Status: DC | PRN

## 2015-01-19 MED ORDER — MORPHINE SULFATE (PF) 2 MG/ML IV SOLN
0.5000 mg | INTRAVENOUS | Status: DC | PRN
  Administered 2015-01-19 – 2015-01-20 (×2): 0.5 mg via INTRAVENOUS
  Filled 2015-01-19 (×2): qty 1

## 2015-01-19 MED ORDER — ONDANSETRON HCL 4 MG/2ML IJ SOLN: 4.0000 mg | Freq: Four times a day (QID) | INTRAMUSCULAR | Status: DC | PRN

## 2015-01-19 MED ORDER — VANCOMYCIN HCL IN DEXTROSE 1-5 GM/200ML-% IV SOLN
1000.0000 mg | Freq: Once | INTRAVENOUS | Status: AC
  Administered 2015-01-19: 1000 mg via INTRAVENOUS
  Filled 2015-01-19: qty 200

## 2015-01-19 MED ORDER — VITAMIN K1 10 MG/ML IJ SOLN
5.0000 mg | Freq: Once | INTRAMUSCULAR | Status: AC
  Administered 2015-01-19: 5 mg via SUBCUTANEOUS
  Filled 2015-01-19: qty 0.5

## 2015-01-19 MED ORDER — SODIUM CHLORIDE 0.9 % IV SOLN
Freq: Once | INTRAVENOUS | Status: AC
  Administered 2015-01-19: 13:00:00 via INTRAVENOUS

## 2015-01-19 NOTE — Progress Notes (Signed)
   01/25/2015 1639  Clinical Encounter Type  Visited With Family  Visit Type Follow-up;Critical Care  Referral From Family  Stress Factors  Family Stress Factors Major life changes   Chaplain checked back in with patient's wife upon her request. Patient's wife stated that their children are on the way. Chaplain offered support, and our support is available as needed.   Jeri Lager, Chaplain 01/26/2015 4:40 PM

## 2015-01-19 NOTE — Progress Notes (Signed)
PT came to ED with a 7.0 ET tube in his R nare. PT was placed on a NR at 15L   ET tube was removed at 1430

## 2015-01-19 NOTE — ED Notes (Signed)
Critical Care Dr and hospitalist at bedside.

## 2015-01-19 NOTE — ED Notes (Signed)
Dr. Ralene Bathe MD at bedside, made aware of period of apnea.  Crit Care paged.

## 2015-01-19 NOTE — H&P (Signed)
[facility] History and Physical                                                                                    Joshua Rodgers, is a 82 y.o. male  MRN: [id number]   DOB - 09-29-1923  Admit Date - 02/11/2015  Outpatient Primary MD for the patient is Kandice Hams, MD  Referring MD: Lake Bells / PCCM  PMH: Past Medical History  Diagnosis Date  . Generalized weakness   . Hypokalemia   . Renal disorder   . Hyperlipidemia   . Vitamin D deficiency   . Bell's palsy   . Lumbar stenosis   . Hypertension   . Depression   . Dementia   . Prostate cancer ([place]) ~ 2004    "had radiation tx" (11/24/2012)  . DVT (deep venous thrombosis) ([place]) 11/24/2012    Acute DVT in the rightcommon femoral vein and acute DVT involving the leftcommon femorall,profinda popliteal and posterior tibial vein 2013. Pt seen by hematology in 2014. W/S revealed continued clot in left leg  . Asthma     "as a child, real bad" (11/24/2012)  . History of bleeding ulcers ~ 1956    "in hospital for ~ 1 month" (11/24/2012)  . Recurrent UTI (urinary tract infection) 2013-now    "lots" (11/24/2012)  . DJD (degenerative joint disease)   . Arthritis     "joints" (11/24/2012)  . Anxiety   . Hyperaldosteronism ([place])   . CHF (congestive heart failure) (HCC)     Diastolic dysfunction  . Vitamin B12 deficiency   . Spinal stenosis     Dr Christella Noa  . Dilated aortic root ([place])   . Mild aortic stenosis     echo 2012  . Atrial fibrillation (HCC)       PSH: Past Surgical History  Procedure Laterality Date  . Total knee arthroplasty Left 02/2010  . Back surgery  2008    "for stenosis" (11/24/2012)  . Esophagogastroduodenoscopy Left 02/16/2013    Procedure: ESOPHAGOGASTRODUODENOSCOPY (EGD);  Surgeon: Arta Silence, MD;  Location: [facility] ENDOSCOPY;  Service: Endoscopy;  Laterality: Left;     CC:  Chief Complaint  Patient presents with  . unresponsive      HPI: This is an 82 year old male patient with past medical  history of dementia, prostate cancer with atonic bladder and chronic dwelling Foley catheter for 2 years, hypertension, DVT and chronic active fibrillation on warfarin, chronic diastolic heart failure and normocytic anemia. He was last hospitalized at this facility and discharged on 10/13 after an admission for UTI. He has been bed bound for years but typically at baseline he can converse and feed himself but is unable to perform his ADLs. Wife reports that over the past several days the patient has not been as communicative as baseline but was still eating. This morning the patient would not wake up and would not move when she tried to get him out of bed. Because of this she called 911. Upon their arrival the patient appeared to be in distress with hypotension blood pressure 70/40 with heart rate averaging between 113 180 bpm. He was nasally intubated by EMS. He was also  cardioverted with change in heart rate to 140s to 150 bpm. Because of the hypotension he was placed on epinephrine infusion. He had no IV access on interosseous access was placed in the left tibia. After arrival to the ER he did not mechanical ventilation but workup demonstrated significant metabolic acidosis setting of mild renal failure and UTI and suspected sepsis. His Foley catheter was changed out upon arrival to the ER. Dr. Lake Bells with PCCM had an extensive discussion with the patient's wife. During his discussion it was determined the patient did have a living will and would not wish for aggressive measures including mechanical ventilation or pressors. Per this discussion the plan is to treat any potentially reversible illnesses such as dehydration and infection but will not escalate care. Patient was extubated and placed on 100% nonrebreather mask and the intraosseous access was removed and a peripheral line was placed. In further discussion with the wife this plan of care was confirmed. Patient currently remains unresponsive.  ER  Evaluation and treatment: Foley temperature 100, pulse 120 bpm, respirations 31, blood pressure 94/77, MAP 84 Portable chest x-ray no acute process ABG: PH 7.37, PCO2 26, PO2 14, bicarbonate 15, acid base deficit 9 Sodium 147 BUN 41 creatinine 2.97 Albumin 2.8 AST 50 Lactic acid are 10.48 WBC 11,400, neutrophils 88% and absolute neutrophils 10 Hemoglobin 10.5 Platelets 155,000 PT 70 and INR 9.04 Glucose 78 Urinalysis with cloudy appearance, many bacteria, large amount hemoglobin, moderate leukocytes, 30 protein, 6-30 WBCs Blood cultures and urine culture obtained in the ER EKG: Atrial tachycardia with ventricular rate 134 bpm, QTC 484 ms, no definitive ischemic changes, voltage criteria consistent with LVH Tylenol 650 mg per rectum 1 2 L normal saline Bedside extubation Removal of intraosseous IV access   Review of Systems   In addition to the HPI above,  **Obtained from the wife No Fever-chills, myalgias  No Headache, changes with Vision or hearing, new weakness, tingling, numbness in any extremity, No problems swallowing food or Liquids, indigestion/reflux No Chest pain, Cough or Shortness of Breath, palpitations, orthopnea or DOE No Abdominal pain, N/V; no melena or hematochezia, no dark tarry stools No dysuria, hematuria or flank pain No new skin rashes, lesions, masses or bruises, No new joints pains-aches No recent weight gain or loss No polyuria, polydypsia or polyphagia,  *A full 10 point Review of Systems was done, except as stated above, all other Review of Systems were negative.  Social History Social History  Substance Use Topics  . Smoking status: Former Smoker -- 10 years    Types: Cigarettes  . Smokeless tobacco: Never Used     Comment: 11/24/2012 "quit smoking cigarettes in ~ 1956"  . Alcohol Use: Yes     Comment: occasoinal wine    Resides at: Private residence  Lives with: Spouse  Ambulatory status: Nonambulatory   Family History Family  History  Problem Relation Age of Onset  . Heart disease Mother   . Diabetes Father      Prior to Admission medications   Medication Sig Start Date End Date Taking? Authorizing Provider  acetaminophen (TYLENOL) 500 MG tablet Take 1,000 mg by mouth every 6 (six) hours as needed for moderate pain, fever or headache.    Historical Provider, MD  ALPRAZolam Duanne Moron) 0.5 MG tablet Take 0.5 mg by mouth 3 (three) times daily as needed for anxiety. For anxiety.    Historical Provider, MD  aMILoride (MIDAMOR) 5 MG tablet Take 10 mg by mouth 2 (two) times daily.  Historical Provider, MD  amLODipine (NORVASC) 10 MG tablet Take 10 mg by mouth daily.    Historical Provider, MD  amoxicillin-clavulanate (AUGMENTIN) 500-125 MG tablet Take 1 tablet (500 mg total) by mouth every 12 (twelve) hours. 10/25/14   Shanker Kristeen Mans, MD  CALCIUM CITRATE PO Take 400 mg by mouth 2 (two) times daily.    Historical Provider, MD  cholecalciferol (VITAMIN D) 1000 UNITS tablet Take 1,000 Units by mouth 2 (two) times daily.     Historical Provider, MD  citalopram (CELEXA) 10 MG tablet Take 10 mg by mouth daily.    Historical Provider, MD  Cyanocobalamin (VITAMIN B 12 PO) Take 1,000 mg by mouth daily.    Historical Provider, MD  docusate sodium 100 MG CAPS Take 100 mg by mouth 2 (two) times daily. 08/28/13   Bobby Rumpf [place], PA-C  ferrous sulfate 324 (65 FE) MG TBEC Take 1 tablet by mouth 2 (two) times daily.     Historical Provider, MD  Glucosamine-Chondroit-Vit C-Mn (GLUCOSAMINE CHONDR 1500 COMPLX) CAPS Take 1 capsule by mouth daily.    Historical Provider, MD  memantine (NAMENDA XR) 28 MG CP24 24 hr capsule Take 28 mg by mouth daily.    Historical Provider, MD  metoprolol tartrate (LOPRESSOR) 25 MG tablet Take 0.5 tablets (12.5 mg total) by mouth 2 (two) times daily. 06/30/14 06/30/15  Bonnielee Haff, MD  pantoprazole (PROTONIX) 40 MG tablet Take 40 mg by mouth daily.    Historical Provider, MD  potassium chloride SA  (K-DUR,KLOR-CON) 20 MEQ tablet Take 20 mEq by mouth 2 (two) times daily.    Historical Provider, MD  warfarin (COUMADIN) 2 MG tablet Take 2-3 mg by mouth daily at 6 PM.     Historical Provider, MD    Allergies  Allergen Reactions  . Contrast Media [Iodinated Diagnostic Agents]     Pt blacked out  . Iohexol      Desc: sob, throat swelling (1988 gdc) pt requires full premeds and does well, JB 8/14/7     Physical Exam  Vitals  Blood pressure 97/63, pulse 133, temperature 100 F (37.8 C), resp. rate 28, SpO2 100 %.   General:  Toxic-appearing elderly male patient  Psych:  Unresponsive  Neuro:   Unresponsive.  ENT:  Ears and Eyes appear Normal, Conjunctivae clear, PER. Dry oral mucosa without erythema or exudates.  Neck:  Supple, No lymphadenopathy appreciated  Respiratory:  Symmetrical chest wall movement, Good air movement bilaterally, coarse to auscultation with sounds louder on the right than left although unable to accurately auscultate since patient is making loud grunting effort with respirations, 100% nonrebreather  Cardiac:  RRR, No Murmurs, no LE edema noted, no JVD, No carotid bruits, peripheral pulses palpable at 2+; extremities cool to the touch capillary refill slightly above 2 seconds  Abdomen:  Positive bowel sounds, Soft, Non tender, Non distended,  No masses appreciated, no obvious hepatosplenomegaly  Skin:  No Cyanosis, poor Skin Turgor, No Skin Rash or Bruise.  Extremities: Symmetrical without obvious trauma or injury,  no effusions.  Data Review  CBC  Recent Labs Lab 02/09/2015 1151  WBC 11.4*  HGB 10.5*  HCT 34.0*  PLT 155  MCV 98.3  MCH 30.3  MCHC 30.9  RDW 15.4  LYMPHSABS 0.6*  MONOABS 0.7  EOSABS 0.1  BASOSABS 0.0    Chemistries   Recent Labs Lab 01/24/2015 1151  NA 147*  K 4.3  CL 112*  CO2 11*  GLUCOSE 78  BUN 41*  CREATININE 2.97*  CALCIUM 8.9  AST 50*  ALT <5*  ALKPHOS 56  BILITOT <0.1*    CrCl cannot be calculated  (Unknown ideal weight.).  No results for input(s): TSH, T4TOTAL, T3FREE, THYROIDAB in the last 72 hours.  Invalid input(s): FREET3  Coagulation profile  Recent Labs Lab 02/06/2015 1300  INR 9.04*    No results for input(s): DDIMER in the last 72 hours.  Cardiac Enzymes No results for input(s): CKMB, TROPONINI, MYOGLOBIN in the last 168 hours.  Invalid input(s): CK  Invalid input(s): POCBNP  Urinalysis    Component Value Date/Time   COLORURINE YELLOW 01/21/2015 1230   APPEARANCEUR CLOUDY* 01/25/2015 1230   LABSPEC 1.011 01/28/2015 1230   PHURINE 6.0 01/14/2015 1230   GLUCOSEU NEGATIVE 01/31/2015 1230   HGBUR LARGE* 01/29/2015 1230   BILIRUBINUR NEGATIVE 02/12/2015 1230   KETONESUR NEGATIVE 02/12/2015 1230   PROTEINUR 30* 02/03/2015 1230   UROBILINOGEN 0.2 10/22/2014 0324   NITRITE NEGATIVE 01/17/2015 1230   LEUKOCYTESUR MODERATE* 01/15/2015 1230    Imaging results:   Dg Chest Portable 1 View  02/12/2015  CLINICAL DATA:  Patient with altered mental status.  Unresponsive. EXAM: PORTABLE CHEST 1 VIEW COMPARISON:  Chest radiograph 10/22/2014 FINDINGS: ET tube terminates in the mid trachea. Stable cardiac and mediastinal contours with marked tortuosity of the thoracic aorta. Low lung volumes. No consolidative pulmonary opacities. Re- demonstrated 1 cm nodular opacity within the left upper lobe. No pleural effusion or pneumothorax. IMPRESSION: ET tube terminates in the mid trachea. Re- demonstrated nodular opacity within the left upper lobe. No acute cardiopulmonary process. Electronically Signed   By: Lovey Newcomer M.D.   On: 01/13/2015 11:58     EKG: (Independently reviewed)  Atrial tachycardia with ventricular rate 134 bpm, QTC 484 ms, no definitive ischemic changes, voltage criteria consistent with LVH   Assessment & Plan  Principal Problem:   Sepsis / UTI -Admitted inpatient/medical floor -No aggressive care no escalation of care -Attempt to treat reversible causes;  suspect primarily related to UTI but so dehydrated may have occult pneumonia so will utilize broad-spectrum antibiotics and treat as unknown cause -Continue volume resuscitation with normal saline at 125 mL per hour noting blood pressure remains suboptimal but MAP greater than 65  Active Problems:   Acute encephalopathy -Likely related to sepsis recent hypotension and metabolic acidemia -Also presented with significant coagulopathy so check CT of the head to rule out intracranial hemorrhage; if hemorrhages present this will further confirmed poor prognosis    Elevated lactic acid level -Seems primarily related to septic process with recent hypotension and less likely related to mild renal failure -We'll repeat at least one more time to see if hydration and antibiotics improving but since we are not escalating care no further indication to cycle    Warfarin-induced coagulopathy  -Holding warfarin -INR 9 so we'll give at least 1 dose of vitamin K subcutaneous -Check lab in a.m.    Bladder, atonic/Chronic indwelling Foley catheter -Foley catheter changed in the ER    Dehydration with hypernatremia/Acute renal failure  -Volume resuscitation as above -Altering any offending medications -Follow labs    Dementia -Seems progressive in nature and patient bedbound -Wife agreeable that if patient has not made significant improvement in the next 48 hours likely transition focus to comfort -Potentially will need palliative medicine evaluation to assist with end of life care    HTN  -Currently blood pressure suboptimal and hypotensive range secondary to sepsis    History of DVT  -  Preadmission was on warfarin -Only on hold secondary to coagulopathy    Chronic diastolic CHF  -Compensated and currently clinically volume depleted    Chronic atrial fibrillation -Currently tachycardic secondary to dehydration and sepsis -Hypotensive so holding preadmission beta blocker    DVT Prophylaxis:  Coagulopathic on preadmission warfarin so SCDs  Family Communication: Wife at bedside    Code Status:  DO NOT RESUSCITATE  Condition:  Poor and anticipate hospital death  Discharge disposition: As above  Time spent in minutes : 60      ELLIS,ALLISON L. ANP on 01/19/2015 at 2:37 PM  You may contact me by going to [email or web address] - password [email or web address]  I am available from 7a-7p but please confirm I am on the schedule by going to Amion as above.   After 7p please contact night coverage person covering me after hours  [facility]  I have taken an interval history, reviewed the chart and examined the patient. I agree with the Advanced Practice Provider's note, impression and recommendations. I have made any necessary editorial changes. 82 year old male with a history of dementia, prostate cancer with atonic bladder and chronic indwelling Foley catheter for past 2 years, hypertension,  DVT was brought to the hospital for altered mental status. Patient was found to be hypotensive by the EMS with blood pressure 70/40. Patient started on epinephrine infusion, nasotracheal tube was placed. Patient did not require mechanical ventilation. After CCM had discussion with patient's wife, patient was made DO NOT RESUSCITATE. IV epinephrine was discontinued. Nasotracheal tube was removed. At this time we'll treat the patient aggressively with IV antibiotics and IV fluids. As he does have sepsis due to UTI. If patient does not improve in next 24-48 hours, consider palliative care consultation for end-of-life care. Patient's wife in agreement with above plan.

## 2015-01-19 NOTE — ED Notes (Signed)
Attempted to call report

## 2015-01-19 NOTE — Progress Notes (Signed)
   02/06/2015 1223  Clinical Encounter Type  Visited With Family;Health care provider  Visit Type Initial;Critical Care  Referral From Nurse  Spiritual Encounters  Spiritual Needs Emotional  Stress Factors  Family Stress Factors Health changes;Major life changes   Chaplain responded to a page to be with the wife of neurologically unresponsive patient. Chaplain came down, facilitated life review, and offered support. Patient's wife seems to think that the patient's outcome will be poor, indicating that he's been sick for quite a while. Wife also relayed that the patient told her last night, "my time is up." Wife stated not wanting to lose her husband, but that he has been suffering for a long time. Chaplain will seek to check back in later today, and our support is available as needed.  Jeri Lager, Chaplain 01/19/2015 12:25 PM

## 2015-01-19 NOTE — ED Notes (Signed)
Received pt from home via ems with c/o called out for Altered mental status, upon arrival pt found to be unresponsive with agonal respirations. Pt was ill yesterday per family. Pt was nasally intubated by EMS. Initial BP for EMS 70/40 Heart rate 130 to 180. Pt cardioverted by EMS heart rate changed to 140-150 after. Pt placed on epi drip by EMS. IO in left tib placed by EMS. Pt already had a foley on place PTA.

## 2015-01-19 NOTE — Progress Notes (Signed)
ANTIBIOTIC CONSULT NOTE - INITIAL  Pharmacy Consult for Vancomycin, Zosyn Indication: rule out sepsis  Allergies  Allergen Reactions  . Contrast Media [Iodinated Diagnostic Agents]     Pt blacked out  . Iohexol      Desc: sob, throat swelling (1988 gdc) pt requires full premeds and does well, JB 8/14/7     Patient Measurements:   Estimated body weight ~ 70 kg  Vital Signs: Temp: 100 F (37.8 C) (01/07 1430) BP: 97/63 mmHg (01/07 1430) Pulse Rate: 133 (01/07 1430) Intake/Output from previous day:   Intake/Output from this shift: Total I/O In: 1000 [I.V.:1000] Out: 900 [Urine:900]  Labs:  Recent Labs  02/01/2015 1151  WBC 11.4*  HGB 10.5*  PLT 155  CREATININE 2.97*   CrCl cannot be calculated (Unknown ideal weight.). No results for input(s): VANCOTROUGH, VANCOPEAK, VANCORANDOM, GENTTROUGH, GENTPEAK, GENTRANDOM, TOBRATROUGH, TOBRAPEAK, TOBRARND, AMIKACINPEAK, AMIKACINTROU, AMIKACIN in the last 72 hours.   Microbiology: No results found for this or any previous visit (from the past 720 hour(s)).  Medical History: Past Medical History  Diagnosis Date  . Generalized weakness   . Hypokalemia   . Renal disorder   . Hyperlipidemia   . Vitamin D deficiency   . Bell's palsy   . Lumbar stenosis   . Hypertension   . Depression   . Dementia   . Prostate cancer ([place]) ~ 2004    "had radiation tx" (11/24/2012)  . DVT (deep venous thrombosis) ([place]) 11/24/2012    Acute DVT in the rightcommon femoral vein and acute DVT involving the leftcommon femorall,profinda popliteal and posterior tibial vein 2013. Pt seen by hematology in 2014. W/S revealed continued clot in left leg  . Asthma     "as a child, real bad" (11/24/2012)  . History of bleeding ulcers ~ 1956    "in hospital for ~ 1 month" (11/24/2012)  . Recurrent UTI (urinary tract infection) 2013-now    "lots" (11/24/2012)  . DJD (degenerative joint disease)   . Arthritis     "joints" (11/24/2012)  . Anxiety   .  Hyperaldosteronism ([place])   . CHF (congestive heart failure) ([facility])     Diastolic dysfunction  . Vitamin B12 deficiency   . Spinal stenosis     Dr Christella Noa  . Dilated aortic root ([place])   . Mild aortic stenosis     echo 2012  . Atrial fibrillation [facility])     Assessment: 82 yo male admitted with r/o sepsis, to begin antibiotics with empiric vancomycin and zosyn.  Scr elevated at 2.97, CrCl~ 15 ml/min.  Goal of Therapy:  Vancomycin trough level 15-20 mcg/ml  Plan:  1. Zosyn 2.25g IV q 8 hrs. 2. Vancomycin 1 g x 1 now.  Will f/u renal function for further dosing.  Uvaldo Rising, BCPS  Clinical Pharmacist Pager [phone]  01/17/2015 3:19 PM

## 2015-01-19 NOTE — Progress Notes (Signed)
Pt brought up to unit at 1805hrs. 15liters oxygen via non rebreather mask in use. Pt unresponsive. Wife at bedside

## 2015-01-19 NOTE — ED Provider Notes (Signed)
CSN: [id number]     Arrival date & time 01/28/2015  1136 History   First MD Initiated Contact with Patient 01/25/2015 1154     Chief Complaint  Patient presents with  . unresponsive       HPI Received pt from home via ems with c/o called out for Altered mental status, upon arrival pt found to be unresponsive with agonal respirations. Pt was ill yesterday per family. Pt was nasally intubated by EMS. Initial BP for EMS 70/40 Heart rate 130 to 180. Pt cardioverted by EMS heart rate changed to 140-150 after. Pt placed on epi drip by EMS. IO in left tib placed by EMS. Pt already had a foley on place PTA. Past Medical History  Diagnosis Date  . Generalized weakness   . Hypokalemia   . Renal disorder   . Hyperlipidemia   . Vitamin D deficiency   . Bell's palsy   . Lumbar stenosis   . Hypertension   . Depression   . Dementia   . Prostate cancer ([place]) ~ 2004    "had radiation tx" (11/24/2012)  . DVT (deep venous thrombosis) ([place]) 11/24/2012    Acute DVT in the rightcommon femoral vein and acute DVT involving the leftcommon femorall,profinda popliteal and posterior tibial vein 2013. Pt seen by hematology in 2014. W/S revealed continued clot in left leg  . Asthma     "as a child, real bad" (11/24/2012)  . History of bleeding ulcers ~ 1956    "in hospital for ~ 1 month" (11/24/2012)  . Recurrent UTI (urinary tract infection) 2013-now    "lots" (11/24/2012)  . DJD (degenerative joint disease)   . Arthritis     "joints" (11/24/2012)  . Anxiety   . Hyperaldosteronism ([place])   . CHF (congestive heart failure) (HCC)     Diastolic dysfunction  . Vitamin B12 deficiency   . Spinal stenosis     Dr Christella Noa  . Dilated aortic root ([place])   . Mild aortic stenosis     echo 2012  . Atrial fibrillation [facility])    Past Surgical History  Procedure Laterality Date  . Total knee arthroplasty Left 02/2010  . Back surgery  2008    "for stenosis" (11/24/2012)  . Esophagogastroduodenoscopy Left 02/16/2013   Procedure: ESOPHAGOGASTRODUODENOSCOPY (EGD);  Surgeon: Arta Silence, MD;  Location: [facility] ENDOSCOPY;  Service: Endoscopy;  Laterality: Left;   Family History  Problem Relation Age of Onset  . Heart disease Mother   . Diabetes Father    Social History  Substance Use Topics  . Smoking status: Former Smoker -- 10 years    Types: Cigarettes  . Smokeless tobacco: Never Used     Comment: 11/24/2012 "quit smoking cigarettes in ~ 1956"  . Alcohol Use: Yes     Comment: occasoinal wine    Review of Systems  Unable to perform ROS: Acuity of condition      Allergies  Contrast media and Iohexol  Home Medications   Prior to Admission medications   Medication Sig Start Date End Date Taking? Authorizing Provider  acetaminophen (TYLENOL) 500 MG tablet Take 1,000 mg by mouth every 6 (six) hours as needed for moderate pain, fever or headache.   Yes Historical Provider, MD  ALPRAZolam Duanne Moron) 0.5 MG tablet Take 0.5 mg by mouth 3 (three) times daily as needed for anxiety. For anxiety.   Yes Historical Provider, MD  aMILoride (MIDAMOR) 5 MG tablet Take 10 mg by mouth 2 (two) times daily.    Yes  Historical Provider, MD  CALCIUM CITRATE PO Take 400 mg by mouth 2 (two) times daily.   Yes Historical Provider, MD  cholecalciferol (VITAMIN D) 1000 UNITS tablet Take 1,000 Units by mouth 2 (two) times daily.    Yes Historical Provider, MD  citalopram (CELEXA) 10 MG tablet Take 10 mg by mouth daily.   Yes Historical Provider, MD  Cyanocobalamin (VITAMIN B 12 PO) Take 1,000 mg by mouth daily.   Yes Historical Provider, MD  ferrous sulfate 324 (65 FE) MG TBEC Take 1 tablet by mouth 2 (two) times daily.    Yes Historical Provider, MD  Glucosamine-Chondroit-Vit C-Mn (GLUCOSAMINE CHONDR 1500 COMPLX) CAPS Take 1 capsule by mouth daily.   Yes Historical Provider, MD  memantine (NAMENDA XR) 28 MG CP24 24 hr capsule Take 28 mg by mouth daily.   Yes Historical Provider, MD  metoprolol tartrate (LOPRESSOR) 25 MG tablet  Take 0.5 tablets (12.5 mg total) by mouth 2 (two) times daily. 06/30/14 06/30/15 Yes Bonnielee Haff, MD  pantoprazole (PROTONIX) 40 MG tablet Take 40 mg by mouth daily.   Yes Historical Provider, MD  penciclovir (DENAVIR) 1 % cream Apply 1 application topically every 2 (two) hours.   Yes Historical Provider, MD  potassium chloride SA (K-DUR,KLOR-CON) 20 MEQ tablet Take 20 mEq by mouth 2 (two) times daily.   Yes Historical Provider, MD  tamsulosin (FLOMAX) 0.4 MG CAPS capsule Take 0.4 mg by mouth daily.   Yes Historical Provider, MD  warfarin (COUMADIN) 2 MG tablet Take 2 mg by mouth daily at 6 PM.    Yes Historical Provider, MD  amoxicillin-clavulanate (AUGMENTIN) 500-125 MG tablet Take 1 tablet (500 mg total) by mouth every 12 (twelve) hours. Patient not taking: Reported on 01/28/2015 10/25/14   Jonetta Osgood, MD  docusate sodium 100 MG CAPS Take 100 mg by mouth 2 (two) times daily. Patient not taking: Reported on 02/11/2015 08/28/13   Bobby Rumpf [place], PA-C   BP 58/43 mmHg  Pulse 130  Temp(Src) 100.5 F (38.1 C) (Axillary)  Resp 10  Wt 155 lb 10.3 oz (70.6 kg)  SpO2 93% Physical Exam  Constitutional: He appears well-developed and well-nourished. He appears distressed. He is intubated.  HENT:  Head: Normocephalic and atraumatic.  Eyes: Pupils are equal, round, and reactive to light.  Neck: Normal range of motion.  Cardiovascular: Normal rate and intact distal pulses.   Pulmonary/Chest: Tachypnea noted. He is intubated.  Abdominal: Normal appearance. He exhibits no distension. There is no tenderness.  Musculoskeletal: Normal range of motion.  Neurological: He is unresponsive. GCS eye subscore is 1. GCS verbal subscore is 1. GCS motor subscore is 1.  Skin: Skin is warm and dry. No rash noted.  Nursing note and vitals reviewed.   ED Course  Procedures (including critical care time) CRITICAL CARE Performed by: Leonard Schwartz L Total critical care time: 45 minutes Critical care time was  exclusive of separately billable procedures and treating other patients. Critical care was necessary to treat or prevent imminent or life-threatening deterioration. Critical care was time spent personally by me on the following activities: development of treatment plan with patient and/or surrogate as well as nursing, discussions with consultants, evaluation of patient's response to treatment, examination of patient, obtaining history from patient or surrogate, ordering and performing treatments and interventions, ordering and review of laboratory studies, ordering and review of radiographic studies, pulse oximetry and re-evaluation of patient's condition. Labs Review Labs Reviewed  COMPREHENSIVE METABOLIC PANEL - Abnormal; Notable for the following:  Sodium 147 (*)    Chloride 112 (*)    CO2 11 (*)    BUN 41 (*)    Creatinine, Ser 2.97 (*)    Total Protein 5.4 (*)    Albumin 2.8 (*)    AST 50 (*)    ALT <5 (*)    Total Bilirubin <0.1 (*)    GFR calc non Af Amer 17 (*)    GFR calc Af Amer 20 (*)    Anion gap 24 (*)    All other components within normal limits  URINALYSIS, ROUTINE W REFLEX MICROSCOPIC (NOT AT [facility]) - Abnormal; Notable for the following:    APPearance CLOUDY (*)    Hgb urine dipstick LARGE (*)    Protein, ur 30 (*)    Leukocytes, UA MODERATE (*)    All other components within normal limits  CBC WITH DIFFERENTIAL/PLATELET - Abnormal; Notable for the following:    WBC 11.4 (*)    RBC 3.46 (*)    Hemoglobin 10.5 (*)    HCT 34.0 (*)    Neutro Abs 10.0 (*)    Lymphs Abs 0.6 (*)    All other components within normal limits  PROTIME-INR - Abnormal; Notable for the following:    Prothrombin Time 70.1 (*)    INR 9.04 (*)    All other components within normal limits  URINE MICROSCOPIC-ADD ON - Abnormal; Notable for the following:    Squamous Epithelial / LPF 0-5 (*)    Bacteria, UA MANY (*)    All other components within normal limits  I-STAT CG4 LACTIC ACID, ED -  Abnormal; Notable for the following:    Lactic Acid, Venous 13.48 (*)    All other components within normal limits  I-STAT ARTERIAL BLOOD GAS, ED - Abnormal; Notable for the following:    pCO2 arterial 25.8 (*)    pO2, Arterial 410.0 (*)    Bicarbonate 14.9 (*)    Acid-base deficit 9.0 (*)    All other components within normal limits  I-STAT CG4 LACTIC ACID, ED - Abnormal; Notable for the following:    Lactic Acid, Venous 8.56 (*)    All other components within normal limits  CULTURE, BLOOD (ROUTINE X 2)  CULTURE, BLOOD (ROUTINE X 2)  URINE CULTURE  CBC  COMPREHENSIVE METABOLIC PANEL  PROTIME-INR  PROCALCITONIN    Imaging Review Ct Head Wo Contrast  02/06/2015  CLINICAL DATA:  Patient with sepsis.  Acute encephalopathy. EXAM: CT HEAD WITHOUT CONTRAST TECHNIQUE: Contiguous axial images were obtained from the base of the skull through the vertex without intravenous contrast. COMPARISON:  CT brain 11/24/2012 FINDINGS: Ventricles and sulci are prominent compatible with atrophy. Periventricular and subcortical white matter hypodensity compatible with chronic microvascular ischemic changes. No evidence for acute cortically based infarct, intracranial hemorrhage, mass lesion or mass-effect. Small crescentic area of high attenuation anterior to the left temporal lobe (image 19; series 2) is favored represent an area of dural calcification. Orbits are unremarkable. There is an air-fluid level demonstrated within the sphenoid sinus. Remainder of the paranasal sinuses are unremarkable. Mastoid air cells are unremarkable. IMPRESSION: No acute intracranial process. Chronic microvascular ischemic changes and atrophy. Nonspecific air-fluid level within the sphenoid sinus as can be seen with acute sinusitis. Electronically Signed   By: Lovey Newcomer M.D.   On: 01/24/2015 15:07   Dg Chest Portable 1 View  01/19/2015  CLINICAL DATA:  Patient with altered mental status.  Unresponsive. EXAM: PORTABLE CHEST 1 VIEW  COMPARISON:  Chest radiograph 10/22/2014 FINDINGS: ET tube terminates in the mid trachea. Stable cardiac and mediastinal contours with marked tortuosity of the thoracic aorta. Low lung volumes. No consolidative pulmonary opacities. Re- demonstrated 1 cm nodular opacity within the left upper lobe. No pleural effusion or pneumothorax. IMPRESSION: ET tube terminates in the mid trachea. Re- demonstrated nodular opacity within the left upper lobe. No acute cardiopulmonary process. Electronically Signed   By: Lovey Newcomer M.D.   On: 01/14/2015 11:58   I have personally reviewed and evaluated these images and lab results as part of my medical decision-making.   EKG Interpretation   Date/Time:  Saturday January 19 2015 11:45:24 EST Ventricular Rate:  134 PR Interval:  96 QRS Duration: 103 QT Interval:  324 QTC Calculation: 484 R Axis:   -12 Text Interpretation:  Sinus or ectopic atrial tachycardia Ventricular  premature complex Posterior infarct, old Repolarization abnormality, prob  rate related Abnormal ekg Confirmed by Audie Pinto  MD, Keyonda Bickle ([id number]) on  01/18/2015 12:26:56 PM      MDM   Final diagnoses:  Sepsis, due to unspecified organism [facility])        Leonard Schwartz, MD 2015-01-31 [phone]

## 2015-01-19 NOTE — Consult Note (Signed)
PULMONARY / CRITICAL CARE MEDICINE   Name: Joshua Rodgers MRN: [id number] DOB: 11-03-1923    ADMISSION DATE:  01/18/2015 CONSULTATION DATE:  01/14/2015  REFERRING MD:  Audie Pinto  CHIEF COMPLAINT:  Unresponsive  HISTORY OF PRESENT ILLNESS:   82 y/o male with demtentia who hasn't walked in 2 years and has a chronic indwelling foley was admitted to [facility] ER on 1/7 with septic shock from a UTI.  His wife provides the history because he was encephalopathic.  She says that at  baseline he can converse and can feed himself but needs assistance with all ADL's.  In the last several days he has been more quiet than usual but still ate yesterday.  Today he didn't wake up and would not move when she tried to get him out of bed.  She called 911.  In route he had a nasotracheal tube placed.  In the ER he has not required mechanical ventilation but he has a metabolic acidosis, renal failure, and a UTI.  His foley catheter was changed.    PAST MEDICAL HISTORY :  He  has a past medical history of Generalized weakness; Hypokalemia; Renal disorder; Hyperlipidemia; Vitamin D deficiency; Bell's palsy; Lumbar stenosis; Hypertension; Depression; Dementia; Prostate cancer ([place]) (~ 2004); DVT (deep venous thrombosis) ([place]) (11/24/2012); Asthma; History of bleeding ulcers (~ 1956); Recurrent UTI (urinary tract infection) (2013-now); DJD (degenerative joint disease); Arthritis; Anxiety; Hyperaldosteronism ([place]); CHF (congestive heart failure) ([place]); Vitamin B12 deficiency; Spinal stenosis; Dilated aortic root ([place]); Mild aortic stenosis; and Atrial fibrillation ([place]).  PAST SURGICAL HISTORY: He  has past surgical history that includes Total knee arthroplasty (Left, 02/2010); Back surgery (2008); and Esophagogastroduodenoscopy (Left, 02/16/2013).  Allergies  Allergen Reactions  . Contrast Media [Iodinated Diagnostic Agents]     Pt blacked out  . Iohexol      Desc: sob, throat swelling (1988 gdc) pt requires full premeds and does  well, JB 8/14/7     No current facility-administered medications on file prior to encounter.   Current Outpatient Prescriptions on File Prior to Encounter  Medication Sig  . acetaminophen (TYLENOL) 500 MG tablet Take 1,000 mg by mouth every 6 (six) hours as needed for moderate pain, fever or headache.  . ALPRAZolam (XANAX) 0.5 MG tablet Take 0.5 mg by mouth 3 (three) times daily as needed for anxiety. For anxiety.  Marland Kitchen aMILoride (MIDAMOR) 5 MG tablet Take 10 mg by mouth 2 (two) times daily.   Marland Kitchen amLODipine (NORVASC) 10 MG tablet Take 10 mg by mouth daily.  Marland Kitchen amoxicillin-clavulanate (AUGMENTIN) 500-125 MG tablet Take 1 tablet (500 mg total) by mouth every 12 (twelve) hours.  Marland Kitchen CALCIUM CITRATE PO Take 400 mg by mouth 2 (two) times daily.  . cholecalciferol (VITAMIN D) 1000 UNITS tablet Take 1,000 Units by mouth 2 (two) times daily.   . citalopram (CELEXA) 10 MG tablet Take 10 mg by mouth daily.  . Cyanocobalamin (VITAMIN B 12 PO) Take 1,000 mg by mouth daily.  Marland Kitchen docusate sodium 100 MG CAPS Take 100 mg by mouth 2 (two) times daily.  . ferrous sulfate 324 (65 FE) MG TBEC Take 1 tablet by mouth 2 (two) times daily.   . Glucosamine-Chondroit-Vit C-Mn (GLUCOSAMINE CHONDR 1500 COMPLX) CAPS Take 1 capsule by mouth daily.  . memantine (NAMENDA XR) 28 MG CP24 24 hr capsule Take 28 mg by mouth daily.  . metoprolol tartrate (LOPRESSOR) 25 MG tablet Take 0.5 tablets (12.5 mg total) by mouth 2 (two) times daily.  . pantoprazole (  PROTONIX) 40 MG tablet Take 40 mg by mouth daily.  . potassium chloride SA (K-DUR,KLOR-CON) 20 MEQ tablet Take 20 mEq by mouth 2 (two) times daily.  Marland Kitchen warfarin (COUMADIN) 2 MG tablet Take 2-3 mg by mouth daily at 6 PM.     FAMILY HISTORY: SOCIAL HISTORY:REVIEW OF SYSTEMS:   Cannot obtain due to intubation/encephalopathy  SUBJECTIVE:  As above  VITAL SIGNS: BP 131/71 mmHg  Pulse 129  Temp(Src) 100.2 F (37.9 C)  Resp 31  SpO2 100%  HEMODYNAMICS:    VENTILATOR  SETTINGS:    INTAKE / OUTPUT:    PHYSICAL EXAMINATION: General:  Chronically ill, non-responsive Neuro:  Stuporous, doesn't open eyes to voice, doesn't respond to touch, some cough, minimal response to painful stimuli HEENT:  NCAT, Mucus membranes dry Cardiovascular:  Irreg irreg, systolic murmur Lungs:  CTA B normal effort Abdomen:  BS+, soft, nontender Musculoskeletal:  Diminished bulk, tone Skin:  No rash or skin breakdown, diminished skin turgor, extremities cool to touch, slow cap refil, radial pulses present  LABS:  BMET  Recent Labs Lab 01/13/2015 1151  NA 147*  K 4.3  CL 112*  CO2 11*  BUN 41*  CREATININE 2.97*  GLUCOSE 78    Electrolytes  Recent Labs Lab 01/28/2015 1151  CALCIUM 8.9    CBC  Recent Labs Lab 01/25/2015 1151  WBC 11.4*  HGB 10.5*  HCT 34.0*  PLT 155    Coag's No results for input(s): APTT, INR in the last 168 hours.  Sepsis Markers  Recent Labs Lab 01/21/2015 1207  LATICACIDVEN 13.48*    ABG  Recent Labs Lab 02/07/2015 1155  PHART 7.370  PCO2ART 25.8*  PO2ART 410.0*    Liver Enzymes  Recent Labs Lab 01/28/2015 1151  AST 50*  ALT <5*  ALKPHOS 56  BILITOT <0.1*  ALBUMIN 2.8*    Cardiac Enzymes No results for input(s): TROPONINI, PROBNP in the last 168 hours.  Glucose No results for input(s): GLUCAP in the last 168 hours.  Imaging Dg Chest Portable 1 View  01/26/2015  CLINICAL DATA:  Patient with altered mental status.  Unresponsive. EXAM: PORTABLE CHEST 1 VIEW COMPARISON:  Chest radiograph 10/22/2014 FINDINGS: ET tube terminates in the mid trachea. Stable cardiac and mediastinal contours with marked tortuosity of the thoracic aorta. Low lung volumes. No consolidative pulmonary opacities. Re- demonstrated 1 cm nodular opacity within the left upper lobe. No pleural effusion or pneumothorax. IMPRESSION: ET tube terminates in the mid trachea. Re- demonstrated nodular opacity within the left upper lobe. No acute  cardiopulmonary process. Electronically Signed   By: Lovey Newcomer M.D.   On: 01/18/2015 11:58     STUDIES:    CULTURES: 1/7 Blood x2 > 1/7 Urine x2 >  ANTIBIOTICS: 1/7 zosyn >   SIGNIFICANT EVENTS:   LINES/TUBES: 1/7 ETT>   DISCUSSION: 82 y/o male with advanced dementia presented to the [facility] hospital ER on 1/7 unresponsive, in septic shock from a UTI due to his chronic indwelling foley catheter.  He has advanced dementia and has always wanted to live at home.  I explained to his wife that he has multi-organ failure (delirium, AKI) due to his septic shock and that his overall prognosis is poor.  At this point he does not need mechanical ventilation as he is keeping up with the demand from his metabolic acidosis.  However if he needed mechanical ventilation it would be difficult to get him off considering his advanced dementia.  Further, if he underwent CPR  he would likely not survive to go home.  So after explaining this to his wife she requested that his code status be DNR.  No CVL, no pressors, no mechanical ventilation, no dialysis.  She would like to give him fluids and antibiotics.  ASSESSMENT / PLAN:  PULMONARY A: Acute respiratory failure with hypoxemia Metabolic acidosis, compensating well from respiratory standpoint Nasal tracheal tube  P:   DNR Extubate now Maintain O2 for O2 saturation > 90%  CARDIOVASCULAR A:  Septic shock > still needs more volume resuscitation Lactic acidosis Afib with RVR P:  Bolus saline again now Continue IVF 125 cc/hr Would not repeat lactic acid as we are not going to give IVF  RENAL A:   Acute on chronic renal failure P:   Continue IVF Monitor BMET and UOP Replace electrolytes as needed  GASTROINTESTINAL A:   No acute issues P:   NPO  HEMATOLOGIC A:   Coagulopathic P:  Monitor for bleeding  INFECTIOUS A:   Septic shock UTI Chronic indwelling foley P:   Change foley Zosyn F/u culture  NEUROLOGIC A:   Advance  dementia Non-ambulatory P:   No sedating meds   FAMILY  - Updates: wife updated at length on 1/7 AM, see discussion about end of life issues above  My cc time 45 minutes  Roselie Awkward, MD [place] P[facility]M Pager: [phone] Cell: [phone] After 3pm or if no response, call [phone]  02/05/2015, 1:57 PM

## 2015-01-20 DIAGNOSIS — I482 Chronic atrial fibrillation: Secondary | ICD-10-CM

## 2015-01-22 LAB — CULTURE, BLOOD (ROUTINE X 2)

## 2015-01-22 LAB — URINE CULTURE: Culture: >=100000

## 2015-01-24 LAB — CULTURE, BLOOD (ROUTINE X 2)

## 2015-02-13 NOTE — Progress Notes (Signed)
RN called to room by patient's family at bedside.  Patient found breathless, pulseless,unresponsive,pupils fixed.  Death pronounced by Alba Cory RN and Junius Argyle RN.  Kathline Magic NP,on call for [facility], notified.  [facility] notified.

## 2015-02-13 NOTE — Progress Notes (Signed)
   01/24/15 0800  Clinical Encounter Type  Visited With Family  Visit Type Death  Referral From Nurse;Family  Spiritual Encounters  Spiritual Needs Grief support;Emotional  [place] paged to pray and support family as pt transitioned; pt death.  Grief and prayer offered to family. 8:23 AM Gwynn Burly

## 2015-02-13 NOTE — Discharge Summary (Addendum)
Death Summary  Joshua Rodgers [id number] DOB: [birth year] DOA: Feb 01, 2015  PCP: Kandice Hams, MD PCP/Office notified:   Admit date: 02-01-15 Date of Death: 02/02/2015  Final Diagnoses:  Principal Problem:   Sepsis ([place]) Active Problems:   Dementia   HTN (hypertension)   Bladder, atonic   History of DVT (deep vein thrombosis)   UTI (lower urinary tract infection)   Chronic diastolic CHF (congestive heart failure) (HCC)   Chronic indwelling Foley catheter   Chronic atrial fibrillation (HCC)   Acute encephalopathy   Elevated lactic acid level   Warfarin-induced coagulopathy ([place])   Dehydration with hypernatremia   Acute renal failure ([place])   Sepsis due to urinary tract infection ([place]) Septic shock secondary to UTI as a consequence of Chronic Indwelling Foley Catheter.  Unresponsive  History of present illness:  This is an 82 year old male patient with past medical history of dementia, prostate cancer with atonic bladder and chronic dwelling Foley catheter for 2 years, hypertension, DVT and chronic active fibrillation on warfarin, chronic diastolic heart failure and normocytic anemia. He was last hospitalized at this facility and discharged on 10/13 after an admission for UTI. He has been bed bound for years but typically at baseline he can converse and feed himself but is unable to perform his ADLs. Wife reports that over the past several days the patient has not been as communicative as baseline but was still eating. This morning the patient would not wake up and would not move when she tried to get him out of bed. Because of this she called 911. Upon their arrival the patient appeared to be in distress with hypotension blood pressure 70/40 with heart rate averaging between 113 180 bpm. He was nasally intubated by EMS. He was also cardioverted with change in heart rate to 140s to 150 bpm. Because of the hypotension he was placed on epinephrine infusion. He had no IV access on  interosseous access was placed in the left tibia. After arrival to the ER he did not mechanical ventilation but workup demonstrated significant metabolic acidosis setting of mild renal failure and UTI and suspected sepsis. His Foley catheter was changed out upon arrival to the ER. Dr. Lake Bells with PCCM had an extensive discussion with the patient's wife. During his discussion it was determined the patient did have a living will and would not wish for aggressive measures including mechanical ventilation or pressors. Per this discussion the plan is to treat any potentially reversible illnesses such as dehydration and infection but will not escalate care. Patient was extubated and placed on 100% nonrebreather mask and the intraosseous access was removed and a peripheral line was placed. In further discussion with the wife this plan of care was confirmed. Patient currently remains unresponsive.  Hospital Course:  Mr. Reinbold is an 82 year old gentleman with a history of dementia, prostate cancer, hypertension, heart failure was admitted to medicine service on Feb 01, 2015 for sepsis. He presented hypotensive with blood pressures of 70/40 having heart rates in the 120s to 130s. Patient had been intubated by EMS. He was evaluated by pulmonary critical care medicine who discussed medical goals of care with family members. It was explained that's in setting of multiorgan failure from septic shock his prognosis was poor. His CODE STATUS was changed to a DO NOT RESUSCITATE as he was extubated. He was admitted to [address] where he was given IV fluids and IV antibiotics. Patient passed early this morning, pronounced dead at 6:54 AM. I was not present at the  time he passed, however, I did come in to speak with family members to offer my support and condolences.   Time: 15  Signed:  Kelvin Cellar  [facility] Jan 21, 2015, 9:40 AM

## 2015-02-13 NOTE — Progress Notes (Signed)
Pt passed away this morning around 0630hrs. Family in room with pt

## 2015-02-13 NOTE — Progress Notes (Signed)
Patient has positive blood culture-gram variable rods in anaerobic bottle only.  Text paged result to K. Government social research officer.

## 2015-02-13 DEATH — deceased
